# Patient Record
Sex: Male | Born: 1960 | Race: Black or African American | Hispanic: No | Marital: Married | Smoking: Never smoker
Health system: Southern US, Community
[De-identification: ages and names within clinical notes are randomized; demographics above are authoritative.]

## PROBLEM LIST (undated history)

## (undated) DIAGNOSIS — I2510 Atherosclerotic heart disease of native coronary artery without angina pectoris: Secondary | ICD-10-CM

## (undated) DIAGNOSIS — I739 Peripheral vascular disease, unspecified: Secondary | ICD-10-CM

## (undated) DIAGNOSIS — E291 Testicular hypofunction: Secondary | ICD-10-CM

## (undated) DIAGNOSIS — I10 Essential (primary) hypertension: Secondary | ICD-10-CM

## (undated) DIAGNOSIS — E559 Vitamin D deficiency, unspecified: Secondary | ICD-10-CM

## (undated) DIAGNOSIS — G4733 Obstructive sleep apnea (adult) (pediatric): Secondary | ICD-10-CM

## (undated) DIAGNOSIS — E119 Type 2 diabetes mellitus without complications: Secondary | ICD-10-CM

## (undated) DIAGNOSIS — E039 Hypothyroidism, unspecified: Secondary | ICD-10-CM

## (undated) DIAGNOSIS — F419 Anxiety disorder, unspecified: Secondary | ICD-10-CM

## (undated) DIAGNOSIS — E7800 Pure hypercholesterolemia, unspecified: Secondary | ICD-10-CM

## (undated) HISTORY — DX: Type 2 diabetes mellitus without complications: E11.9

## (undated) HISTORY — DX: Anxiety disorder, unspecified: F41.9

## (undated) HISTORY — DX: Essential (primary) hypertension: I10

## (undated) HISTORY — DX: Peripheral vascular disease, unspecified: I73.9

## (undated) HISTORY — DX: Hypothyroidism, unspecified: E03.9

## (undated) HISTORY — PX: COSMETIC SURGERY: SHX468

## (undated) HISTORY — DX: Vitamin D deficiency, unspecified: E55.9

## (undated) HISTORY — DX: Pure hypercholesterolemia, unspecified: E78.00

## (undated) HISTORY — DX: Testicular hypofunction: E29.1

---

## 2004-09-02 ENCOUNTER — Inpatient Hospital Stay (HOSPITAL_COMMUNITY): Admission: EM | Admit: 2004-09-02 | Discharge: 2004-09-05 | Payer: Self-pay | Admitting: Emergency Medicine

## 2017-02-17 ENCOUNTER — Telehealth: Payer: Self-pay

## 2017-02-17 NOTE — Telephone Encounter (Signed)
Sent notes to scheduling 

## 2017-03-17 ENCOUNTER — Institutional Professional Consult (permissible substitution): Payer: BLUE CROSS/BLUE SHIELD | Admitting: Neurology

## 2017-03-17 ENCOUNTER — Ambulatory Visit (INDEPENDENT_AMBULATORY_CARE_PROVIDER_SITE_OTHER): Payer: BLUE CROSS/BLUE SHIELD | Admitting: Neurology

## 2017-03-17 ENCOUNTER — Encounter: Payer: Self-pay | Admitting: Neurology

## 2017-03-17 ENCOUNTER — Encounter (INDEPENDENT_AMBULATORY_CARE_PROVIDER_SITE_OTHER): Payer: Self-pay

## 2017-03-17 VITALS — BP 141/78 | HR 51 | Resp 20 | Ht 75.0 in | Wt 304.0 lb

## 2017-03-17 DIAGNOSIS — R0683 Snoring: Secondary | ICD-10-CM | POA: Diagnosis not present

## 2017-03-17 DIAGNOSIS — G4710 Hypersomnia, unspecified: Secondary | ICD-10-CM | POA: Diagnosis not present

## 2017-03-17 DIAGNOSIS — Z72820 Sleep deprivation: Secondary | ICD-10-CM | POA: Diagnosis not present

## 2017-03-17 DIAGNOSIS — G4719 Other hypersomnia: Secondary | ICD-10-CM | POA: Diagnosis not present

## 2017-03-17 DIAGNOSIS — G4730 Sleep apnea, unspecified: Secondary | ICD-10-CM | POA: Diagnosis not present

## 2017-03-17 DIAGNOSIS — Z6837 Body mass index (BMI) 37.0-37.9, adult: Secondary | ICD-10-CM | POA: Insufficient documentation
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
## 2017-03-25 ENCOUNTER — Telehealth: Payer: Self-pay | Admitting: Cardiovascular Disease

## 2017-03-25 NOTE — Telephone Encounter (Signed)
Received records from [facility] for appointment on 04/13/17 with Dr Duke Salvia.  Records put with Dr Leonides Sake schedule for 04/13/17. lp

## 2017-04-13 ENCOUNTER — Encounter: Payer: Self-pay | Admitting: Cardiovascular Disease

## 2017-04-13 ENCOUNTER — Ambulatory Visit (INDEPENDENT_AMBULATORY_CARE_PROVIDER_SITE_OTHER): Payer: BLUE CROSS/BLUE SHIELD | Admitting: Cardiovascular Disease

## 2017-04-13 VITALS — BP 142/90 | HR 64 | Ht 75.0 in | Wt 304.0 lb

## 2017-04-13 DIAGNOSIS — I10 Essential (primary) hypertension: Secondary | ICD-10-CM | POA: Diagnosis not present

## 2017-04-13 DIAGNOSIS — R0602 Shortness of breath: Secondary | ICD-10-CM

## 2017-04-13 DIAGNOSIS — R079 Chest pain, unspecified: Secondary | ICD-10-CM

## 2017-04-13 DIAGNOSIS — E7800 Pure hypercholesterolemia, unspecified: Secondary | ICD-10-CM | POA: Diagnosis not present

## 2017-04-13 NOTE — Progress Notes (Signed)
Cardiology Office Note   Date:  04/13/2017   ID:  [name] Hammond, DOB August 16, 1961, MRN [id number]  PCP:  Gwynneth Aliment, MD  Cardiologist:   Chilton Si, MD   Chief Complaint  Patient presents with  . New Evaluation    referred by Dr Allyne Gee for HTN and chest pain, pt c/o chest pain yesterday didn't take anything for it      History of Present Illness: [name] Hammond is a 56 y.o. male with hypertension, tobacco abuse, hypothyroidism and diabetes who is being seen today for the evaluation of chest pan at the request of [name] Peng, MD. Mr. Boeve saw Dr. Allyne Gee on 02/12/17 a reported intermittent left-sided chest pain. For the last 2-3 months he notes substernal chest pressure that Typically occurs when he is laying down in bed. It is better when he lays flat and worse when he is at an angle. He thinks it might be treatable to eating late at night. The pain is 7 out of 10 in severity and does not radiate. There is mild shortness of breath and diaphoresis but no nausea. He tried taking antacids and drinking apple cider vinegar without improvement in his symptoms. The symptoms get better when he takes deep breaths. [name] Hammond denies chest pain during the day unless he feel stressed. Sometimes when he is stressed or anxious he has substernal chest pressure but also increases with deep breathing.  His wife notes that he snores heavily and he is scheduled for sleep study later this week. She is also witnessed apneic episodes.  [name] Hammond has been very stressed at work lately. He is working long hours and has a dementing scheduled. He has not been exercising much in the last 2 years and has gained 35 pounds over this time. He has no lower extremity edema recently but does have some when he eats salty meals or is sitting for prolonged periods of time. He denies orthopnea or PND. He eats out daily and does not pay much attention to his salt intake.  [name] Hammond father had a CABG in his 60s.   Past  Medical History:  Diagnosis Date  . Anxiety   . Diabetes mellitus without complication (HCC)   . Hypertension   . Hypothyroid   . Pure hypercholesterolemia   . PVD (peripheral vascular disease) (HCC)   . Testicular hypofunction   . Vitamin D deficiency     History reviewed. No pertinent surgical history.   Current Outpatient Prescriptions  Medication Sig Dispense Refill  . aspirin EC 81 MG tablet Take 81 mg by mouth daily.    Marland Kitchen atorvastatin (LIPITOR) 10 MG tablet Take 10 mg by mouth daily.    Marland Kitchen levothyroxine (SYNTHROID, LEVOTHROID) 100 MCG tablet Take 100 mcg by mouth daily before breakfast.    . lisinopril-hydrochlorothiazide (PRINZIDE,ZESTORETIC) 20-12.5 MG tablet Take 1 tablet by mouth daily.    . pantoprazole (PROTONIX) 40 MG tablet Take 40 mg by mouth daily.    . Saxagliptin-Metformin (KOMBIGLYZE XR) 04-999 MG TB24 Take by mouth.    . sertraline (ZOLOFT) 50 MG tablet Take 50 mg by mouth daily.    Marland Kitchen triamcinolone cream (KENALOG) 0.1 % Apply 1 application topically 2 (two) times daily.     No current facility-administered medications for this visit.     Allergies:   Patient has no known allergies.    Social History:  The patient  reports that he has never smoked. He has never used smokeless tobacco. He reports  that he does not drink alcohol or use drugs.   Family History:  The patient's family history includes Cancer - Other in his father; Diabetes type II in his brother, brother, and mother; Heart disease in his father; Hypertension in his father; Thyroid disease in his mother and sister.    ROS:  Please see the history of present illness.   Otherwise, review of systems are positive for none.   All other systems are reviewed and negative.    PHYSICAL EXAM: VS:  BP (!) 142/90   Pulse 64   Ht  (1.905 m)   Wt (!) 137.9 kg (304 lb)   BMI 38.00 kg/m  , BMI Body mass index is 38 kg/m. GENERAL:  Well appearing HEENT:  Pupils equal round and reactive, fundi not  visualized, oral mucosa unremarkable NECK:  No jugular venous distention, waveform within normal limits, carotid upstroke brisk and symmetric, no bruits, no thyromegaly LYMPHATICS:  No cervical adenopathy LUNGS:  Clear to auscultation bilaterally HEART:  RRR.  PMI not displaced or sustained,S1 and S2 within normal limits, no S3, no S4, no clicks, no rubs, no murmurs ABD:  Flat, positive bowel sounds normal in frequency in pitch, no bruits, no rebound, no guarding, no midline pulsatile mass, no hepatomegaly, no splenomegaly EXT:  2 plus pulses throughout, no edema, no cyanosis no clubbing SKIN:  No rashes no nodules NEURO:  Cranial nerves II through XII grossly intact, motor grossly intact throughout PSYCH:  Cognitively intact, oriented to person place and time    EKG:  EKG is ordered today. The ekg ordered today demonstrates sinus rhythm.  Rate 64 bpm.   Recent Labs: No results found for requested labs within last 8760 hours.    respiratory 2/18:   TSH 2.3.  total cholesterol 139, triglyerides 73, HDL 39, LDL 85  WBC 8.3, hemoglon 16.1, hematcrit 42.9, atelets 278 Hemoglobin A1c 6.5  Sodium 139, potassium 4.1, BUN 10, creatinine 0.86  AST 30, ALT 37  Lipid Panel No results found for: CHOL, TRIG, HDL, CHOLHDL, VLDL, LDLCALC, LDLDIRECT    Wt Readings from Last 3 Encounters:  04/13/17 (!) 137.9 kg (304 lb)  03/17/17 (!) 137.9 kg (304 lb)      ASSESSMENT AND PLAN:  # Atypical chest pain: [name] Hammond symptoms are not classic for angina.  He doesn't have exertional symptoms.  It is worse when laying down, which is suspicious for GERD.  However, it is odd that it improves when laying flat compared with laying at an angle.  OSA could also be the cause.  We will get an 80 to better evaluate for ischemia.  # Hypertension: [name] Hammond hasn't taken his medication yet today. I have asked him to always take his medicine for his appointments. Continue lisinopril and hydrochlorothiazide. I've  asked him to check his blood pressure and bring these reports to his follow-up appointment.  # Hyperlipidemia: LDL 85 01/2017. Continue atorvastatin.   # Morbid obesity:  [name] Hammond was encouraged to start exercising as long as his stress test is okay. He is also eating out most days of the week. We discussed the importance of making healthy her food choices and trying to eat at home more often.      Current medicines are reviewed at length with the patient today.  The patient does not have concerns regarding medicines.  The following changes have been made:  no change  Labs/ tests ordered today include:   Orders Placed This Encounter  Procedures  . EXERCISE TOLERANCE TEST  . EKG 12-Lead     Disposition:   FU with Seve Monette C. Duke Salvia, MD, [facility] in 1 month.   This note was written with the assistance of speech recognition software.  Please excuse any transcriptional errors.  Signed, Zaliyah Meikle C. Duke Salvia, MD, [facility]  04/13/2017 1:18 PM    [facility]

## 2017-04-13 NOTE — Patient Instructions (Addendum)
Medication Instructions:  Your physician recommends that you continue on your current medications as directed. Please refer to the Current Medication list given to you today.  Labwork: none  Testing/Procedures: Your physician has requested that you have an exercise tolerance test. For further information please visit [email or web address]. Please also follow instruction sheet, as given.  Follow-Up: Your physician recommends that you schedule a follow-up appointment in: 1 month ov  If you need a refill on your cardiac medications before your next appointment, please call your pharmacy.  

## 2017-04-17 ENCOUNTER — Ambulatory Visit (INDEPENDENT_AMBULATORY_CARE_PROVIDER_SITE_OTHER): Payer: BLUE CROSS/BLUE SHIELD | Admitting: Neurology

## 2017-04-17 DIAGNOSIS — G4710 Hypersomnia, unspecified: Secondary | ICD-10-CM | POA: Diagnosis not present

## 2017-04-17 DIAGNOSIS — G4730 Sleep apnea, unspecified: Secondary | ICD-10-CM | POA: Diagnosis not present

## 2017-04-17 DIAGNOSIS — Z72820 Sleep deprivation: Secondary | ICD-10-CM

## 2017-04-17 DIAGNOSIS — G4733 Obstructive sleep apnea (adult) (pediatric): Secondary | ICD-10-CM

## 2017-04-17 DIAGNOSIS — G4719 Other hypersomnia: Secondary | ICD-10-CM

## 2017-04-17 DIAGNOSIS — G4734 Idiopathic sleep related nonobstructive alveolar hypoventilation: Secondary | ICD-10-CM

## 2017-04-17 DIAGNOSIS — R0683 Snoring: Secondary | ICD-10-CM

## 2017-04-24 ENCOUNTER — Telehealth (HOSPITAL_COMMUNITY): Payer: Self-pay

## 2017-04-24 NOTE — Telephone Encounter (Signed)
Encounter complete. 

## 2017-04-28 ENCOUNTER — Ambulatory Visit (HOSPITAL_COMMUNITY)
Admission: RE | Admit: 2017-04-28 | Discharge: 2017-04-28 | Disposition: A | Discharge status: Home / Self Care | Payer: BLUE CROSS/BLUE SHIELD | Source: Ambulatory Visit | Attending: Cardiology | Admitting: Cardiology

## 2017-04-28 ENCOUNTER — Encounter (HOSPITAL_COMMUNITY): Payer: Self-pay | Admitting: *Deleted

## 2017-04-28 DIAGNOSIS — R079 Chest pain, unspecified: Secondary | ICD-10-CM | POA: Diagnosis not present

## 2017-04-28 LAB — EXERCISE TOLERANCE TEST
CHL CUP MPHR: 164 {beats}/min
CHL CUP RESTING HR STRESS: 60 {beats}/min
CSEPEDS: 8 s
CSEPEW: 7.2 METS
CSEPPHR: 146 {beats}/min
Exercise duration (min): 6 min
Percent HR: 89 %
RPE: 19

## 2017-04-28 NOTE — Progress Notes (Signed)
Test reviewed by Dr. Duke Salviaandolph, ok to leave.

## 2017-04-29 ENCOUNTER — Telehealth: Payer: Self-pay

## 2017-04-29 NOTE — Procedures (Signed)
PATIENT'S NAME:  [name] Hammond, [name] Hammond DOB:      1961/09/14      MR#:    [id number]     DATE OF RECORDING: 04/17/2017 REFERRING M.D.:  [name], MD Study Performed:   Baseline Polysomnogram HISTORY:  Morbid obesity, Anxiety, Diabetes, Hypertension, Hypothyroidism, PVD, Testicular hypofunction  Patient gained over 40 pounds during a period of depression; begun snoring loudly, wife has witnessed apneas. Sleep hygiene rules were explained and allergic rhinitis treatment recommended aside from formal sleep apnea  testing.  The patient endorsed the Epworth Sleepiness Scale at 8 points.   The patient's weight 304 pounds with a height of 75 (inches), resulting in a BMI of 37.8 kg/m2. The patient's neck circumference measured 19.5 inches.  CURRENT MEDICATIONS: Aspirin, Lipitor, Synthroid, Lisinopril, Metformin, Zoloft, Kenalog   PROCEDURE:  This is a multichannel digital polysomnogram utilizing the Somnostar 11.2 system.  Electrodes and sensors were applied and monitored per AASM Specifications.   EEG, EOG, Chin and Limb EMG, were sampled at 200 Hz.  ECG, Snore and Nasal Pressure, Thermal Airflow, Respiratory Effort, CPAP Flow and Pressure, Oximetry was sampled at 50 Hz. Digital video and audio were recorded.      BASELINE STUDY  Lights Out was at 22:51 and Lights On at 05:15.  Total recording time (TRT) was 384.5 minutes, with a total sleep time (TST) of  335.5 minutes.   The patient's sleep latency was 25.5 minutes.  REM latency was 116 minutes.  The sleep efficiency was 87.3 %.     SLEEP ARCHITECTURE: WASO (Wake after sleep onset) was 31 minutes.  There were 61.5 minutes in Stage N1, 181 minutes Stage N2, 42.5 minutes Stage N3 and 50.5 minutes in Stage REM.  The percentage of Stage N1 was 18.3%, Stage N2 was 53.9%, Stage N3 was 12.7% and Stage R (REM sleep) was 15.1%.   RESPIRATORY ANALYSIS:  There were a total of 267 respiratory events:  116 obstructive apneas, 1 central apnea and 4 mixed apneas with a  total of 121 apneas and an apnea index (AI) of 21.6 /hour. There were 146 hypopneas with a hypopnea index of 26.1 /hour.  The patient also had 0 respiratory event related arousals (RERAs).     The total APNEA/HYPOPNEA INDEX (AHI) was 47.7/hour and the total RESPIRATORY DISTURBANCE INDEX was 47.7 /hour.  33 events occurred in REM sleep and 281 events in NREM. The REM AHI was 39.2 /hour, versus a non-REM AHI of 49.3. The patient spent 335.5 minutes of total sleep time in the supine position and 0 minutes in non-supine. The supine AHI was 47.7 versus a non-supine AHI of 0.0.  OXYGEN SATURATION & C02:  The Wake baseline 02 saturation was 96%, with the lowest being 52%. Time spent below 89% saturation equaled 133 minutes.    PERIODIC LIMB MOVEMENTS:   The patient had a total of 0 Periodic Limb Movements.  The arousals were noted as: 44 were spontaneous, 0 were associated with PLMs, and 127 were associated with respiratory events. Audio and video analysis did not show any abnormal or unusual movements, behaviors, phonations or vocalizations.   The patient took no bathroom breaks. Thunderous Snoring was noted in supine sleep, worst in REM stages. EKG was in keeping with normal sinus rhythm (NSR).    IMPRESSION:  1. Severe Obstructive Sleep Apnea (OSA) with snoring, REM accentuation, all sleep supine. The AHI was  47.7 , hypoxemia  to a SpO2 nadir of  52% with 133 minutes of total desaturation  time.        Hypoxemia is a possible cause of sleep related headaches.  RECOMMENDATIONS:  1. Advise full-night, attended, CPAP titration study to optimize therapy.   2. A follow up appointment will [facility]. The referring provider will be notified of the results.      I certify that I have reviewed the entire raw data recording prior to the issuance of this report in accordance with the Standards of Accreditation of the American Academy of Sleep  Medicine (AASM)      Melvyn Novasarmen Patricia Perales, MD  04-29-2017  Diplomat, American Board of Psychiatry and Neurology  Diplomat, American Board of Sleep Medicine Medical Director, [facility]

## 2017-04-29 NOTE — Telephone Encounter (Signed)
-----   Message from Melvyn Novasarmen Dohmeier, MD sent at 04/29/2017  2:08 PM EDT ----- IMPRESSION:  1. Severe Obstructive Sleep Apnea (OSA) with snoring, REM accentuation, all sleep supine. The AHI was  47.7 /hr. of sleep. 2. Hypoxemia  to a SpO2 nadir of  52% with 133 minutes of total desaturation time.        Hypoxemia is a possible cause of reported sleep related headaches.   RECOMMENDATIONS:  1. Advise full-night, attended, CPAP titration study to optimize therapy.  2. Weight loss is recommended.  3. Exercise and diet to be implemented. Sleep hygiene to be implemented .  2. A follow up appointment will be scheduled in the Sleep Clinic at [facility]. The referring provider will be notified of the results.

## 2017-04-29 NOTE — Addendum Note (Signed)
Addended by: Melvyn NovasHMEIER, Miray Mancino on: 04/29/2017 02:08 PM   Modules accepted: Orders

## 2017-04-29 NOTE — Telephone Encounter (Signed)
I called pt. I advised pt that Dr. Vickey Hugerohmeier reviewed their sleep study results and found that pt has severe osa with hypoxemia. Dr. Vickey Hugerohmeier recommends that pt return for a repeat sleep study in order to properly titrate the cpap and ensure a good mask fit. Pt is agreeable to returning for a titration study. I advised pt that our sleep lab will file with pt's insurance and call pt to schedule the sleep study when we hear back from the pt's insurance regarding coverage of this sleep study. In the meantime, I encouraged exercise and dieting, and discussed sleep hygiene recommendations. Pt verbalized understanding of results. Pt had no questions at this time but was encouraged to call back if questions arise.

## 2017-04-30 ENCOUNTER — Telehealth: Payer: Self-pay | Admitting: Cardiovascular Disease

## 2017-04-30 ENCOUNTER — Encounter: Payer: Self-pay | Admitting: *Deleted

## 2017-04-30 ENCOUNTER — Telehealth: Payer: Self-pay | Admitting: *Deleted

## 2017-04-30 ENCOUNTER — Other Ambulatory Visit: Payer: Self-pay | Admitting: Cardiovascular Disease

## 2017-04-30 DIAGNOSIS — R079 Chest pain, unspecified: Secondary | ICD-10-CM

## 2017-04-30 DIAGNOSIS — R9439 Abnormal result of other cardiovascular function study: Secondary | ICD-10-CM

## 2017-04-30 DIAGNOSIS — Z01818 Encounter for other preprocedural examination: Secondary | ICD-10-CM

## 2017-04-30 NOTE — Telephone Encounter (Signed)
-----   Message from Chilton Siiffany Kerman, MD sent at 04/30/2017  3:45 PM EDT ----- reveiwed with patient.  Cath next week.

## 2017-04-30 NOTE — Telephone Encounter (Signed)
Patient scheduled for cath Friday May 18 arrive at 8:30 for 10:30  Patient aware of date and time Letter at front desk for pick  [facility] [place] [facility]

## 2017-04-30 NOTE — Telephone Encounter (Signed)
Abnormal stress reviewed with [name].  He consents to have a left heart catheterization.  Plan for 5/18.  Risks and benefits of cardiac catheterization have been discussed with the patient.  The patient understands that risks included but are not limited to stroke (1 in 1000), death (1 in 1000), kidney failure [usually temporary] (1 in 500), bleeding (1 in 200), allergic reaction [possibly serious] (1 in 200). The patient understands and agrees to proceed.   Jamile Sivils C. Duke Salviaandolph, MD, [facility]  04/30/2017 3:40 PM

## 2017-05-04 LAB — CBC WITH DIFFERENTIAL/PLATELET
BASOS ABS: 0 {cells}/uL (ref 0–200)
Basophils Relative: 0 %
Eosinophils Absolute: 188 cells/uL (ref 15–500)
Eosinophils Relative: 2 %
HEMATOCRIT: 45.3 % (ref 38.5–50.0)
HEMOGLOBIN: 15.1 g/dL (ref 13.2–17.1)
LYMPHS ABS: 2162 {cells}/uL (ref 850–3900)
LYMPHS PCT: 23 %
MCH: 29.7 pg (ref 27.0–33.0)
MCHC: 33.3 g/dL (ref 32.0–36.0)
MCV: 89.2 fL (ref 80.0–100.0)
MONO ABS: 564 {cells}/uL (ref 200–950)
MPV: 9.7 fL (ref 7.5–12.5)
Monocytes Relative: 6 %
NEUTROS PCT: 69 %
Neutro Abs: 6486 cells/uL (ref 1500–7800)
Platelets: 286 10*3/uL (ref 140–400)
RBC: 5.08 MIL/uL (ref 4.20–5.80)
RDW: 14.2 % (ref 11.0–15.0)
WBC: 9.4 10*3/uL (ref 3.8–10.8)

## 2017-05-05 LAB — BASIC METABOLIC PANEL
BUN: 10 mg/dL (ref 7–25)
CALCIUM: 9.2 mg/dL (ref 8.6–10.3)
CO2: 22 mmol/L (ref 20–31)
CREATININE: 0.91 mg/dL (ref 0.70–1.33)
Chloride: 105 mmol/L (ref 98–110)
GLUCOSE: 208 mg/dL — AB (ref 65–99)
Potassium: 4.2 mmol/L (ref 3.5–5.3)
Sodium: 140 mmol/L (ref 135–146)

## 2017-05-05 LAB — PROTIME-INR
INR: 1
PROTHROMBIN TIME: 10.8 s (ref 9.0–11.5)

## 2017-05-08 ENCOUNTER — Other Ambulatory Visit: Payer: Self-pay | Admitting: *Deleted

## 2017-05-08 ENCOUNTER — Inpatient Hospital Stay (HOSPITAL_COMMUNITY)
Admission: AD | Admit: 2017-05-08 | Discharge: 2017-05-24 | DRG: 234 | Disposition: A | Discharge status: Home Health | Payer: BLUE CROSS/BLUE SHIELD | Source: Ambulatory Visit | Attending: Cardiothoracic Surgery | Admitting: Cardiothoracic Surgery

## 2017-05-08 ENCOUNTER — Encounter (HOSPITAL_COMMUNITY): Payer: Self-pay | Admitting: Cardiovascular Disease

## 2017-05-08 ENCOUNTER — Other Ambulatory Visit (HOSPITAL_COMMUNITY): Payer: BLUE CROSS/BLUE SHIELD

## 2017-05-08 ENCOUNTER — Encounter (HOSPITAL_COMMUNITY)
Admission: AD | Disposition: A | Discharge status: Home Health | Payer: Self-pay | Source: Ambulatory Visit | Attending: Cardiothoracic Surgery

## 2017-05-08 DIAGNOSIS — I8310 Varicose veins of unspecified lower extremity with inflammation: Secondary | ICD-10-CM | POA: Diagnosis present

## 2017-05-08 DIAGNOSIS — Z7952 Long term (current) use of systemic steroids: Secondary | ICD-10-CM

## 2017-05-08 DIAGNOSIS — I25110 Atherosclerotic heart disease of native coronary artery with unstable angina pectoris: Principal | ICD-10-CM | POA: Diagnosis present

## 2017-05-08 DIAGNOSIS — E8770 Fluid overload, unspecified: Secondary | ICD-10-CM | POA: Diagnosis not present

## 2017-05-08 DIAGNOSIS — Y92230 Patient room in hospital as the place of occurrence of the external cause: Secondary | ICD-10-CM | POA: Diagnosis present

## 2017-05-08 DIAGNOSIS — Z8249 Family history of ischemic heart disease and other diseases of the circulatory system: Secondary | ICD-10-CM

## 2017-05-08 DIAGNOSIS — I10 Essential (primary) hypertension: Secondary | ICD-10-CM | POA: Diagnosis present

## 2017-05-08 DIAGNOSIS — F419 Anxiety disorder, unspecified: Secondary | ICD-10-CM | POA: Diagnosis present

## 2017-05-08 DIAGNOSIS — Z79890 Hormone replacement therapy (postmenopausal): Secondary | ICD-10-CM | POA: Diagnosis not present

## 2017-05-08 DIAGNOSIS — E559 Vitamin D deficiency, unspecified: Secondary | ICD-10-CM | POA: Diagnosis present

## 2017-05-08 DIAGNOSIS — E7800 Pure hypercholesterolemia, unspecified: Secondary | ICD-10-CM | POA: Diagnosis not present

## 2017-05-08 DIAGNOSIS — E669 Obesity, unspecified: Secondary | ICD-10-CM | POA: Diagnosis not present

## 2017-05-08 DIAGNOSIS — J9811 Atelectasis: Secondary | ICD-10-CM | POA: Diagnosis not present

## 2017-05-08 DIAGNOSIS — Z951 Presence of aortocoronary bypass graft: Secondary | ICD-10-CM | POA: Diagnosis not present

## 2017-05-08 DIAGNOSIS — D62 Acute posthemorrhagic anemia: Secondary | ICD-10-CM | POA: Diagnosis not present

## 2017-05-08 DIAGNOSIS — R0602 Shortness of breath: Secondary | ICD-10-CM

## 2017-05-08 DIAGNOSIS — I2510 Atherosclerotic heart disease of native coronary artery without angina pectoris: Secondary | ICD-10-CM

## 2017-05-08 DIAGNOSIS — G4733 Obstructive sleep apnea (adult) (pediatric): Secondary | ICD-10-CM | POA: Diagnosis not present

## 2017-05-08 DIAGNOSIS — E1151 Type 2 diabetes mellitus with diabetic peripheral angiopathy without gangrene: Secondary | ICD-10-CM | POA: Diagnosis present

## 2017-05-08 DIAGNOSIS — E785 Hyperlipidemia, unspecified: Secondary | ICD-10-CM | POA: Diagnosis present

## 2017-05-08 DIAGNOSIS — Y832 Surgical operation with anastomosis, bypass or graft as the cause of abnormal reaction of the patient, or of later complication, without mention of misadventure at the time of the procedure: Secondary | ICD-10-CM | POA: Diagnosis present

## 2017-05-08 DIAGNOSIS — Z7982 Long term (current) use of aspirin: Secondary | ICD-10-CM

## 2017-05-08 DIAGNOSIS — E039 Hypothyroidism, unspecified: Secondary | ICD-10-CM | POA: Diagnosis present

## 2017-05-08 DIAGNOSIS — Z7984 Long term (current) use of oral hypoglycemic drugs: Secondary | ICD-10-CM | POA: Diagnosis not present

## 2017-05-08 DIAGNOSIS — Z01810 Encounter for preprocedural cardiovascular examination: Secondary | ICD-10-CM | POA: Diagnosis not present

## 2017-05-08 DIAGNOSIS — E782 Mixed hyperlipidemia: Secondary | ICD-10-CM | POA: Diagnosis not present

## 2017-05-08 DIAGNOSIS — Z79899 Other long term (current) drug therapy: Secondary | ICD-10-CM

## 2017-05-08 DIAGNOSIS — Z6836 Body mass index (BMI) 36.0-36.9, adult: Secondary | ICD-10-CM | POA: Diagnosis not present

## 2017-05-08 DIAGNOSIS — Z8349 Family history of other endocrine, nutritional and metabolic diseases: Secondary | ICD-10-CM

## 2017-05-08 DIAGNOSIS — I259 Chronic ischemic heart disease, unspecified: Secondary | ICD-10-CM | POA: Diagnosis present

## 2017-05-08 DIAGNOSIS — R001 Bradycardia, unspecified: Secondary | ICD-10-CM | POA: Diagnosis present

## 2017-05-08 DIAGNOSIS — E1169 Type 2 diabetes mellitus with other specified complication: Secondary | ICD-10-CM | POA: Diagnosis not present

## 2017-05-08 DIAGNOSIS — F329 Major depressive disorder, single episode, unspecified: Secondary | ICD-10-CM | POA: Diagnosis present

## 2017-05-08 DIAGNOSIS — Z833 Family history of diabetes mellitus: Secondary | ICD-10-CM

## 2017-05-08 DIAGNOSIS — R000 Tachycardia, unspecified: Secondary | ICD-10-CM | POA: Diagnosis not present

## 2017-05-08 DIAGNOSIS — L7632 Postprocedural hematoma of skin and subcutaneous tissue following other procedure: Secondary | ICD-10-CM | POA: Diagnosis not present

## 2017-05-08 DIAGNOSIS — I200 Unstable angina: Secondary | ICD-10-CM

## 2017-05-08 DIAGNOSIS — E291 Testicular hypofunction: Secondary | ICD-10-CM | POA: Diagnosis present

## 2017-05-08 DIAGNOSIS — Z22322 Carrier or suspected carrier of Methicillin resistant Staphylococcus aureus: Secondary | ICD-10-CM

## 2017-05-08 DIAGNOSIS — R0789 Other chest pain: Secondary | ICD-10-CM | POA: Diagnosis present

## 2017-05-08 HISTORY — DX: Obstructive sleep apnea (adult) (pediatric): G47.33

## 2017-05-08 HISTORY — PX: LEFT HEART CATH AND CORONARY ANGIOGRAPHY: CATH118249

## 2017-05-08 HISTORY — PX: INTRAVASCULAR ULTRASOUND/IVUS: CATH118244

## 2017-05-08 LAB — GLUCOSE, CAPILLARY
GLUCOSE-CAPILLARY: 120 mg/dL — AB (ref 65–99)
Glucose-Capillary: 140 mg/dL — ABNORMAL HIGH (ref 65–99)
Glucose-Capillary: 128 mg/dL — ABNORMAL HIGH (ref 65–99)
Glucose-Capillary: 117 mg/dL — ABNORMAL HIGH (ref 65–99)

## 2017-05-08 LAB — URINALYSIS, ROUTINE W REFLEX MICROSCOPIC
Bilirubin Urine: NEGATIVE
Glucose, UA: NEGATIVE mg/dL
Hgb urine dipstick: NEGATIVE
Ketones, ur: NEGATIVE mg/dL
Leukocytes, UA: NEGATIVE
Nitrite: NEGATIVE
Protein, ur: NEGATIVE mg/dL
Specific Gravity, Urine: 1.023 (ref 1.005–1.030)
pH: 6 (ref 5.0–8.0)

## 2017-05-08 LAB — SURGICAL PCR SCREEN
MRSA, PCR: POSITIVE — AB
Staphylococcus aureus: POSITIVE — AB

## 2017-05-08 LAB — POCT ACTIVATED CLOTTING TIME: Activated Clotting Time: 224 seconds

## 2017-05-08 SURGERY — LEFT HEART CATH AND CORONARY ANGIOGRAPHY
Anesthesia: LOCAL

## 2017-05-08 MED ORDER — MIDAZOLAM HCL 2 MG/2ML IJ SOLN
INTRAMUSCULAR | Status: AC
  Filled 2017-05-08: qty 2

## 2017-05-08 MED ORDER — MIDAZOLAM HCL 2 MG/2ML IJ SOLN
INTRAMUSCULAR | Status: DC | PRN
  Administered 2017-05-08: 2 mg via INTRAVENOUS

## 2017-05-08 MED ORDER — ASPIRIN EC 81 MG PO TBEC
81.0000 mg | DELAYED_RELEASE_TABLET | Freq: Every evening | ORAL | Status: DC
  Administered 2017-05-08 – 2017-05-11 (×4): 81 mg via ORAL
  Filled 2017-05-08 (×4): qty 1

## 2017-05-08 MED ORDER — ASPIRIN 81 MG PO CHEW
81.0000 mg | CHEWABLE_TABLET | ORAL | Status: AC
  Administered 2017-05-08: 81 mg via ORAL

## 2017-05-08 MED ORDER — LIDOCAINE HCL (PF) 1 % IJ SOLN
INTRAMUSCULAR | Status: AC
  Filled 2017-05-08: qty 30

## 2017-05-08 MED ORDER — LISINOPRIL-HYDROCHLOROTHIAZIDE 20-12.5 MG PO TABS: 1.0000 | ORAL_TABLET | Freq: Every day | ORAL | Status: DC

## 2017-05-08 MED ORDER — VERAPAMIL HCL 2.5 MG/ML IV SOLN
INTRAVENOUS | Status: AC
  Filled 2017-05-08: qty 2

## 2017-05-08 MED ORDER — HYDROCHLOROTHIAZIDE 12.5 MG PO CAPS
12.5000 mg | ORAL_CAPSULE | Freq: Every day | ORAL | Status: DC
  Administered 2017-05-08 – 2017-05-11 (×4): 12.5 mg via ORAL
  Filled 2017-05-08 (×4): qty 1

## 2017-05-08 MED ORDER — SODIUM CHLORIDE 0.9% FLUSH: 3.0000 mL | INTRAVENOUS | Status: DC | PRN

## 2017-05-08 MED ORDER — IOPAMIDOL (ISOVUE-370) INJECTION 76%
INTRAVENOUS | Status: AC
  Filled 2017-05-08: qty 100

## 2017-05-08 MED ORDER — INSULIN ASPART 100 UNIT/ML ~~LOC~~ SOLN
0.0000 [IU] | Freq: Three times a day (TID) | SUBCUTANEOUS | Status: DC
  Administered 2017-05-09: 3 [IU] via SUBCUTANEOUS
  Administered 2017-05-10: 2 [IU] via SUBCUTANEOUS
  Administered 2017-05-11: 3 [IU] via SUBCUTANEOUS

## 2017-05-08 MED ORDER — SERTRALINE HCL 50 MG PO TABS
50.0000 mg | ORAL_TABLET | Freq: Every evening | ORAL | Status: DC
  Administered 2017-05-08 – 2017-05-23 (×15): 50 mg via ORAL
  Filled 2017-05-08 (×15): qty 1

## 2017-05-08 MED ORDER — HEPARIN (PORCINE) IN NACL 2-0.9 UNIT/ML-% IJ SOLN
INTRAMUSCULAR | Status: AC
  Filled 2017-05-08: qty 1000

## 2017-05-08 MED ORDER — LIDOCAINE HCL (PF) 1 % IJ SOLN
INTRAMUSCULAR | Status: DC | PRN
  Administered 2017-05-08: 2 mL via SUBCUTANEOUS

## 2017-05-08 MED ORDER — SODIUM CHLORIDE 0.9 % IV SOLN
INTRAVENOUS | Status: AC
  Administered 2017-05-08: 15:00:00 via INTRAVENOUS

## 2017-05-08 MED ORDER — ACETAMINOPHEN 325 MG PO TABS: 650.0000 mg | ORAL_TABLET | ORAL | Status: DC | PRN

## 2017-05-08 MED ORDER — MUPIROCIN 2 % EX OINT
1.0000 "application " | TOPICAL_OINTMENT | Freq: Two times a day (BID) | CUTANEOUS | Status: AC
  Administered 2017-05-08 – 2017-05-13 (×9): 1 via NASAL
  Filled 2017-05-08 (×2): qty 22

## 2017-05-08 MED ORDER — HEPARIN (PORCINE) IN NACL 2-0.9 UNIT/ML-% IJ SOLN
INTRAMUSCULAR | Status: AC | PRN
  Administered 2017-05-08: 1000 mL

## 2017-05-08 MED ORDER — ZOLPIDEM TARTRATE 5 MG PO TABS: 5.0000 mg | ORAL_TABLET | Freq: Every evening | ORAL | Status: DC | PRN

## 2017-05-08 MED ORDER — LISINOPRIL 20 MG PO TABS
20.0000 mg | ORAL_TABLET | Freq: Every day | ORAL | Status: DC
  Administered 2017-05-08 – 2017-05-11 (×4): 20 mg via ORAL
  Filled 2017-05-08 (×4): qty 1

## 2017-05-08 MED ORDER — HEPARIN SODIUM (PORCINE) 1000 UNIT/ML IJ SOLN
INTRAMUSCULAR | Status: AC
  Filled 2017-05-08: qty 1

## 2017-05-08 MED ORDER — SODIUM CHLORIDE 0.9 % IV SOLN: 250.0000 mL | INTRAVENOUS | Status: DC | PRN

## 2017-05-08 MED ORDER — ASPIRIN 81 MG PO CHEW
CHEWABLE_TABLET | ORAL | Status: AC
  Administered 2017-05-08: 81 mg via ORAL
  Filled 2017-05-08: qty 1

## 2017-05-08 MED ORDER — SODIUM CHLORIDE 0.9% FLUSH: 3.0000 mL | Freq: Two times a day (BID) | INTRAVENOUS | Status: DC

## 2017-05-08 MED ORDER — FENTANYL CITRATE (PF) 100 MCG/2ML IJ SOLN
INTRAMUSCULAR | Status: DC | PRN
  Administered 2017-05-08: 50 ug via INTRAVENOUS

## 2017-05-08 MED ORDER — IOPAMIDOL (ISOVUE-370) INJECTION 76%
INTRAVENOUS | Status: DC | PRN
  Administered 2017-05-08: 100 mL

## 2017-05-08 MED ORDER — ONDANSETRON HCL 4 MG/2ML IJ SOLN: 4.0000 mg | Freq: Four times a day (QID) | INTRAMUSCULAR | Status: DC | PRN

## 2017-05-08 MED ORDER — ATORVASTATIN CALCIUM 80 MG PO TABS
80.0000 mg | ORAL_TABLET | Freq: Every day | ORAL | Status: DC
  Administered 2017-05-08 – 2017-05-23 (×15): 80 mg via ORAL
  Filled 2017-05-08 (×15): qty 1

## 2017-05-08 MED ORDER — SODIUM CHLORIDE 0.9% FLUSH
3.0000 mL | Freq: Two times a day (BID) | INTRAVENOUS | Status: DC
Start: 2017-05-08 — End: 2017-05-12
  Administered 2017-05-08 – 2017-05-11 (×7): 3 mL via INTRAVENOUS

## 2017-05-08 MED ORDER — SODIUM CHLORIDE 0.9 % WEIGHT BASED INFUSION: 1.0000 mL/kg/h | INTRAVENOUS | Status: DC

## 2017-05-08 MED ORDER — INSULIN GLARGINE 100 UNIT/ML ~~LOC~~ SOLN
20.0000 [IU] | Freq: Every day | SUBCUTANEOUS | Status: DC
  Administered 2017-05-08 – 2017-05-11 (×4): 20 [IU] via SUBCUTANEOUS
  Filled 2017-05-08 (×4): qty 0.2

## 2017-05-08 MED ORDER — SODIUM CHLORIDE 0.9 % WEIGHT BASED INFUSION
3.0000 mL/kg/h | INTRAVENOUS | Status: DC
  Administered 2017-05-08: 3 mL/kg/h via INTRAVENOUS

## 2017-05-08 MED ORDER — LEVOTHYROXINE SODIUM 100 MCG PO TABS
100.0000 ug | ORAL_TABLET | Freq: Every day | ORAL | Status: DC
  Administered 2017-05-09 – 2017-05-24 (×15): 100 ug via ORAL
  Filled 2017-05-08 (×15): qty 1

## 2017-05-08 MED ORDER — ALPRAZOLAM 0.25 MG PO TABS: 0.2500 mg | ORAL_TABLET | Freq: Two times a day (BID) | ORAL | Status: DC | PRN

## 2017-05-08 MED ORDER — MOMETASONE FURO-FORMOTEROL FUM 200-5 MCG/ACT IN AERO
2.0000 | INHALATION_SPRAY | Freq: Two times a day (BID) | RESPIRATORY_TRACT | Status: DC
  Filled 2017-05-08 (×2): qty 8.8

## 2017-05-08 MED ORDER — IOPAMIDOL (ISOVUE-370) INJECTION 76%
INTRAVENOUS | Status: AC
  Filled 2017-05-08: qty 50

## 2017-05-08 MED ORDER — CHLORHEXIDINE GLUCONATE CLOTH 2 % EX PADS
6.0000 | MEDICATED_PAD | Freq: Every day | CUTANEOUS | Status: DC
  Administered 2017-05-09 – 2017-05-11 (×4): 6 via TOPICAL

## 2017-05-08 MED ORDER — GUAIFENESIN ER 600 MG PO TB12
600.0000 mg | ORAL_TABLET | Freq: Two times a day (BID) | ORAL | Status: DC
  Administered 2017-05-08 – 2017-05-11 (×7): 600 mg via ORAL
  Filled 2017-05-08 (×7): qty 1

## 2017-05-08 MED ORDER — HEPARIN SODIUM (PORCINE) 1000 UNIT/ML IJ SOLN
INTRAMUSCULAR | Status: DC | PRN
  Administered 2017-05-08: 3000 [IU] via INTRAVENOUS
  Administered 2017-05-08 (×2): 6000 [IU] via INTRAVENOUS

## 2017-05-08 MED ORDER — FENTANYL CITRATE (PF) 100 MCG/2ML IJ SOLN
INTRAMUSCULAR | Status: AC
  Filled 2017-05-08: qty 2

## 2017-05-08 SURGICAL SUPPLY — 15 items
CATH 5FR JL3.5 JR4 ANG PIG MP (CATHETERS) ×1 IMPLANT
CATH OPTICROSS 40MHZ (CATHETERS) ×2 IMPLANT
CATH VISTA GUIDE 6FR XBLAD3.5 (CATHETERS) ×1 IMPLANT
DEVICE RAD COMP TR BAND LRG (VASCULAR PRODUCTS) ×1 IMPLANT
GLIDESHEATH SLEND SS 6F .021 (SHEATH) ×1 IMPLANT
GUIDEWIRE INQWIRE 1.5J.035X260 (WIRE) IMPLANT
INQWIRE 1.5J .035X260CM (WIRE) ×2
KIT ESSENTIALS PG (KITS) ×1 IMPLANT
KIT HEART LEFT (KITS) ×2 IMPLANT
PACK CARDIAC CATHETERIZATION (CUSTOM PROCEDURE TRAY) ×2 IMPLANT
SLED PULL BACK IVUS (MISCELLANEOUS) ×1 IMPLANT
SYR MEDRAD MARK V 150ML (SYRINGE) ×2 IMPLANT
TRANSDUCER W/STOPCOCK (MISCELLANEOUS) ×2 IMPLANT
TUBING CIL FLEX 10 FLL-RA (TUBING) ×2 IMPLANT
WIRE COUGAR XT STRL 190CM (WIRE) ×1 IMPLANT

## 2017-05-08 NOTE — H&P (View-Only) (Signed)
  Cardiology Office Note   Date:  04/13/2017   ID:  [name] Hammond, DOB 12/24/1960, MRN [id number]  PCP:  Robyn N Sanders, MD  Cardiologist:   Valdis Bevill [place], MD   Chief Complaint  Patient presents with  . New Evaluation    referred by Dr Sanders for HTN and chest pain, pt c/o chest pain yesterday didn't take anything for it      History of Present Illness: [name] Hammond is a 56 y.o. male with hypertension, tobacco abuse, hypothyroidism and diabetes who is being seen today for the evaluation of chest pan at the request of Sanders, Robyn, MD. [name] Hammond saw Dr. Sanders on 02/12/17 a reported intermittent left-sided chest pain. For the last 2-3 months he notes substernal chest pressure that Typically occurs when he is laying down in bed. It is better when he lays flat and worse when he is at an angle. He thinks it might be treatable to eating late at night. The pain is 7 out of 10 in severity and does not radiate. There is mild shortness of breath and diaphoresis but no nausea. He tried taking antacids and drinking apple cider vinegar without improvement in his symptoms. The symptoms get better when he takes deep breaths. [name] Hammond denies chest pain during the day unless he feel stressed. Sometimes when he is stressed or anxious he has substernal chest pressure but also increases with deep breathing.  His wife notes that he snores heavily and he is scheduled for sleep study later this week. She is also witnessed apneic episodes.  [name] Hammond has been very stressed at work lately. He is working long hours and has a dementing scheduled. He has not been exercising much in the last 2 years and has gained 35 pounds over this time. He has no lower extremity edema recently but does have some when he eats salty meals or is sitting for prolonged periods of time. He denies orthopnea or PND. He eats out daily and does not pay much attention to his salt intake.  [name] Hammond's father had a CABG in his 50s.   Past  Medical History:  Diagnosis Date  . Anxiety   . Diabetes mellitus without complication (HCC)   . Hypertension   . Hypothyroid   . Pure hypercholesterolemia   . PVD (peripheral vascular disease) (HCC)   . Testicular hypofunction   . Vitamin D deficiency     History reviewed. No pertinent surgical history.   Current Outpatient Prescriptions  Medication Sig Dispense Refill  . aspirin EC 81 MG tablet Take 81 mg by mouth daily.    . atorvastatin (LIPITOR) 10 MG tablet Take 10 mg by mouth daily.    . levothyroxine (SYNTHROID, LEVOTHROID) 100 MCG tablet Take 100 mcg by mouth daily before breakfast.    . lisinopril-hydrochlorothiazide (PRINZIDE,ZESTORETIC) 20-12.5 MG tablet Take 1 tablet by mouth daily.    . pantoprazole (PROTONIX) 40 MG tablet Take 40 mg by mouth daily.    . Saxagliptin-Metformin (KOMBIGLYZE XR) 04-999 MG TB24 Take by mouth.    . sertraline (ZOLOFT) 50 MG tablet Take 50 mg by mouth daily.    . triamcinolone cream (KENALOG) 0.1 % Apply 1 application topically 2 (two) times daily.     No current facility-administered medications for this visit.     Allergies:   Patient has no known allergies.    Social History:  The patient  reports that he has never smoked. He has never used smokeless tobacco. He reports   that he does not drink alcohol or use drugs.   Family History:  The patient's family history includes Cancer - Other in his father; Diabetes type II in his brother, brother, and mother; Heart disease in his father; Hypertension in his father; Thyroid disease in his mother and sister.    ROS:  Please see the history of present illness.   Otherwise, review of systems are positive for none.   All other systems are reviewed and negative.    PHYSICAL EXAM: VS:  BP (!) 142/90   Pulse 64   Ht 6' 3" (1.905 m)   Wt (!) 137.9 kg (304 lb)   BMI 38.00 kg/m  , BMI Body mass index is 38 kg/m. GENERAL:  Well appearing HEENT:  Pupils equal round and reactive, fundi not  visualized, oral mucosa unremarkable NECK:  No jugular venous distention, waveform within normal limits, carotid upstroke brisk and symmetric, no bruits, no thyromegaly LYMPHATICS:  No cervical adenopathy LUNGS:  Clear to auscultation bilaterally HEART:  RRR.  PMI not displaced or sustained,S1 and S2 within normal limits, no S3, no S4, no clicks, no rubs, no murmurs ABD:  Flat, positive bowel sounds normal in frequency in pitch, no bruits, no rebound, no guarding, no midline pulsatile mass, no hepatomegaly, no splenomegaly EXT:  2 plus pulses throughout, no edema, no cyanosis no clubbing SKIN:  No rashes no nodules NEURO:  Cranial nerves II through XII grossly intact, motor grossly intact throughout PSYCH:  Cognitively intact, oriented to person place and time    EKG:  EKG is ordered today. The ekg ordered today demonstrates sinus rhythm.  Rate 64 bpm.   Recent Labs: No results found for requested labs within last 8760 hours.    respiratory 2/18:   TSH 2.3.  total cholesterol 139, triglyerides 73, HDL 39, LDL 85  WBC 8.3, hemoglon 14.8, hematcrit 42.9, atelets 278 Hemoglobin A1c 6.5  Sodium 139, potassium 4.1, BUN 10, creatinine 0.86  AST 30, ALT 37  Lipid Panel No results found for: CHOL, TRIG, HDL, CHOLHDL, VLDL, LDLCALC, LDLDIRECT    Wt Readings from Last 3 Encounters:  04/13/17 (!) 137.9 kg (304 lb)  03/17/17 (!) 137.9 kg (304 lb)      ASSESSMENT AND PLAN:  # Atypical chest pain: [name] Hammond's symptoms are not classic for angina.  He doesn't have exertional symptoms.  It is worse when laying down, which is suspicious for GERD.  However, it is odd that it improves when laying flat compared with laying at an angle.  OSA could also be the cause.  We will get an 80 to better evaluate for ischemia.  # Hypertension: [name] Hammond hasn't taken his medication yet today. I have asked him to always take his medicine for his appointments. Continue lisinopril and hydrochlorothiazide. I've  asked him to check his blood pressure and bring these reports to his follow-up appointment.  # Hyperlipidemia: LDL 85 01/2017. Continue atorvastatin.   # Morbid obesity:  [name] Hammond was encouraged to start exercising as long as his stress test is okay. He is also eating out most days of the week. We discussed the importance of making healthy her food choices and trying to eat at home more often.      Current medicines are reviewed at length with the patient today.  The patient does not have concerns regarding medicines.  The following changes have been made:  no change  Labs/ tests ordered today include:   Orders Placed This Encounter    Procedures  . EXERCISE TOLERANCE TEST  . EKG 12-Lead     Disposition:   FU with [name] Hammond C. [place], MD, FACC in 1 month.   This note was written with the assistance of speech recognition software.  Please excuse any transcriptional errors.  Signed, Sherrin Stahle C. Willcox, MD, FACC  04/13/2017 1:18 PM    [facility] 

## 2017-05-08 NOTE — Consult Note (Signed)
[address]       [place] [address]             [phone]        Tymar Polyak [facility] Medical Record #[id number] Date of Birth: 1961-01-09  Referring: Clifton James  [facility]) Primary Care: [name], MD  Chief Complaint:   CAD  History of Present Illness:      Mr. Dull is a 56 yo morbidly obese AA male with known history of HTN, OSA, hypothyroidism, and diabetes.  The patient presented to to his PCP in February with complaints of intermittent left sided chest pain.  He admitted for the past 2-3 months that he noted substernal chest pressure.  This typically occurs when the patient was laying down, worse if at an angle, better if laying flat.  The patient felt this was relatable to eating late at night.  The pain was 7/10 without radiation.  He attempted relief with antacids and apple cider vinegar.  Due to this he was referred to [facility] heart care for evaluation.  He was seen by Dr. Duke Salvia who felt the patients complaints were not classic angina.  However, it was indicated that stress test should be performed.  This was done on 04/28/2017 and was positive for ischemia.  He was subsequently scheduled for cardiac catheterization.  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  It was felt due to the location of his CAD, coronary bypass grafting would be indicated and TCTS consult has been requested.  Currently the patient is chest pain free.  He does have a positive family history of CAD with his father having bypass surgery in his early 78s.  He is stressed as of late by his work.  He is a Surveyor, minerals for [employer].  He has gained weight due to depression.  Current Activity/ Functional Status: Patient is independent with mobility/ambulation, transfers, ADL's, IADL's.   Zubrod Score: At the time of surgery this patient's most appropriate activity status/level should be described as: []     0    Normal activity, no  symptoms [x]     1    Restricted in physical strenuous activity but ambulatory, able to do out light work []     2    Ambulatory and capable of self care, unable to do work activities, up and about                 more than 50%  Of the time                            []     3    Only limited self care, in bed greater than 50% of waking hours []     4    Completely disabled, no self care, confined to bed or chair []     5    Moribund  Past Medical History:  Diagnosis Date  . Anxiety   . Diabetes mellitus without complication (HCC)   . Hypertension   . Hypothyroid   . Pure hypercholesterolemia   . PVD (peripheral vascular disease) (HCC)   . Testicular hypofunction   . Vitamin D deficiency     Past Surgical History:  Procedure Laterality Date  . INTRAVASCULAR ULTRASOUND/IVUS N/A 05/08/2017   Procedure: Intravascular Ultrasound/IVUS;  Surgeon: Kathleene Hazel, MD;  Location: [facility] INVASIVE CV LAB;  Service: Cardiovascular;  Laterality: N/A;  .  LEFT HEART CATH AND CORONARY ANGIOGRAPHY N/A 05/08/2017   Procedure: Left Heart Cath and Coronary Angiography;  Surgeon: Kathleene Hazel, MD;  Location: [facility] INVASIVE CV LAB;  Service: Cardiovascular;  Laterality: N/A;    History  Smoking Status  . Never Smoker  Smokeless Tobacco  . Never Used    History  Alcohol Use No    Social History   Social History  . Marital status: Married    Spouse name: N/A  . Number of children: N/A  . Years of education: N/A   Occupational History  . Not on file.   Social History Main Topics  . Smoking status: Never Smoker  . Smokeless tobacco: Never Used  . Alcohol use No  . Drug use: No  . Sexual activity: Not on file   Other Topics Concern  . Not on file   Social History Narrative  . No narrative on file    No Known Allergies  Current Facility-Administered Medications  Medication Dose Route Frequency Provider Last Rate Last Dose  . 0.9 %  sodium chloride infusion   Intravenous  Continuous Kathleene Hazel, MD 75 mL/hr at 05/08/17 1448    . 0.9 %  sodium chloride infusion  250 mL Intravenous PRN Kathleene Hazel, MD      . acetaminophen (TYLENOL) tablet 650 mg  650 mg Oral Q4H PRN Kathleene Hazel, MD      . ALPRAZolam Prudy Feeler) tablet 0.25 mg  0.25 mg Oral BID PRN Kathleene Hazel, MD      . aspirin EC tablet 81 mg  81 mg Oral QPM Kathleene Hazel, MD      . atorvastatin (LIPITOR) tablet 80 mg  80 mg Oral QHS Kathleene Hazel, MD      . lisinopril (PRINIVIL,ZESTRIL) tablet 20 mg  20 mg Oral Daily Kathleene Hazel, MD   20 mg at 05/08/17 1254   And  . hydrochlorothiazide (MICROZIDE) capsule 12.5 mg  12.5 mg Oral Daily Kathleene Hazel, MD   12.5 mg at 05/08/17 1254  . insulin aspart (novoLOG) injection 0-15 Units  0-15 Units Subcutaneous TID WC Kathleene Hazel, MD      . Melene Muller ON 05/09/2017] levothyroxine (SYNTHROID, LEVOTHROID) tablet 100 mcg  100 mcg Oral QAC breakfast Kathleene Hazel, MD      . ondansetron [facility]) injection 4 mg  4 mg Intravenous Q6H PRN Kathleene Hazel, MD      . sertraline (ZOLOFT) tablet 50 mg  50 mg Oral QPM Verne Carrow D, MD      . sodium chloride flush (NS) 0.9 % injection 3 mL  3 mL Intravenous Q12H Verne Carrow D, MD      . sodium chloride flush (NS) 0.9 % injection 3 mL  3 mL Intravenous PRN Kathleene Hazel, MD      . zolpidem (AMBIEN) tablet 5 mg  5 mg Oral QHS PRN,MR X 1 Kathleene Hazel, MD        Prescriptions Prior to Admission  Medication Sig Dispense Refill Last Dose  . aspirin EC 81 MG tablet Take 81 mg by mouth every evening.    05/07/2017 at Unknown time  . atorvastatin (LIPITOR) 10 MG tablet Take 10 mg by mouth at bedtime.    05/07/2017 at Unknown time  . calcium carbonate (TUMS - DOSED IN MG ELEMENTAL CALCIUM) 500 MG chewable tablet Chew 1-2 tablets by mouth at bedtime as needed for indigestion or heartburn.   Past  Week  at Unknown time  . levothyroxine (SYNTHROID, LEVOTHROID) 100 MCG tablet Take 100 mcg by mouth daily before breakfast.   05/07/2017 at Unknown time  . lisinopril-hydrochlorothiazide (PRINZIDE,ZESTORETIC) 20-12.5 MG tablet Take 1 tablet by mouth daily at 12 noon.    05/07/2017 at Unknown time  . neomycin-bacitracin-polymyxin (NEOSPORIN) ointment Apply 1 application topically as needed for wound care. apply to eye   Past Week at Unknown time  . Saxagliptin-Metformin (KOMBIGLYZE XR) 04-999 MG TB24 Take 1 tablet by mouth daily with breakfast.    05/07/2017 at 0700  . sertraline (ZOLOFT) 50 MG tablet Take 50 mg by mouth every evening.    05/07/2017 at Unknown time    Family History  Problem Relation Age of Onset  . Diabetes type II Mother   . Thyroid disease Mother   . Cancer - Other Father        Liver  . Hypertension Father   . Heart disease Father        Bypass surgery  . Thyroid disease Sister   . Diabetes type II Brother   . Diabetes type II Brother      Review of Systems:  Constitutional: positive for weight gain, negative for fevers and night sweats Eyes: negative Respiratory: positive for mild shortness of breath with episodes of chest discomfort Cardiovascular: positive for chest pressure/discomfort and lower extremity edema Gastrointestinal: negative Hematologic/lymphatic: venous stasis changes noted, mild varicosities RLE Neurological: negative Endocrine: positive for Hypothyroidism, DM     Cardiac Review of Systems: Y or N  Chest Pain [ y   ]  Resting SOB [   ] Exertional SOB  [  ]  Orthopnea [  ]   Pedal Edema [   ]    Palpitations [  ] Syncope  [  ]   Presyncope [   ]  General Review of Systems: [Y] = yes [  ]=no Constitional: recent weight change [ y ]; anorexia [  ]; fatigue [  ]; nausea [  ]; night sweats [  ]; fever [  ]; or chills [  ]                                                               Dental: poor dentition[  ]; Last Dentist visit:   Eye : blurred vision [   ]; diplopia [   ]; vision changes [  ];  Amaurosis fugax[  ]; Resp: cough [ n ];  wheezing[ n ];  hemoptysis[  ]; shortness of breath[  ]; paroxysmal nocturnal dyspnea[  ]; dyspnea on exertion[ n ]; or orthopnea[  ];  GI:  gallstones[  ], vomiting[  ];  dysphagia[  ]; melena[  ];  hematochezia [  ]; heartburn[  ];   Hx of  Colonoscopy[  ]; GU: kidney stones [  ]; hematuria[  ];   dysuria [  ];  nocturia[  ];  history of     obstruction [  ]; urinary frequency [  ]             Skin: rash, swelling[  ];, hair loss[  ];  peripheral edema[ y ];  or itching[  ]; Musculosketetal: myalgias[  ];  joint swelling[  ];  joint erythema[  ];  joint pain[  ];  back pain[  ];  Heme/Lymph: bruising[  ];  bleeding[  ];  anemia[  ];  Neuro: TIA[n  ];  headaches[n  ];  stroke[  ];  vertigo[  ];  seizures[  ];   paresthesias[  ];  difficulty walking[  ];  Psych:depression[  ]; anxiety[  ];  Endocrine: diabetes[y  ];  thyroid dysfunction[ y ];  Immunizations: Flu [  ]; Pneumococcal[  ];  Other:  Physical Exam: BP (!) 147/88   Pulse (!) 0   Temp 97.6 F (36.4 C) (Oral)   Resp (!) 0   Ht 6\' 3"  (1.905 m)   Wt 300 lb (136.1 kg)   SpO2 95%   BMI 37.50 kg/m   General appearance: alert, cooperative, no distress and morbidly obese Head: Normocephalic, without obvious abnormality, atraumatic Neck: no adenopathy, no carotid bruit, no JVD, supple, symmetrical, trachea midline and thyroid not enlarged, symmetric, no tenderness/mass/nodules Resp: clear to auscultation bilaterally Cardio: regular rate and rhythm GI: soft, non-tender; bowel sounds normal; no masses,  no organomegaly Extremities: varicose veins noted and venous stasis dermatitis noted Neurologic: Grossly normal  Diagnostic Studies & Laboratory data:     Recent Radiology Findings:   No results found.   I have independently reviewed the above radiologic studies.  Recent Lab Findings: Lab Results  Component Value Date   WBC 9.4 05/04/2017    HGB 15.1 05/04/2017   HCT 45.3 05/04/2017   PLT 286 05/04/2017   GLUCOSE 208 (H) 05/04/2017   NA 140 05/04/2017   K 4.2 05/04/2017   CL 105 05/04/2017   CREATININE 0.91 05/04/2017   BUN 10 05/04/2017   CO2 22 05/04/2017   INR 1.0 05/04/2017      Assessment / Plan:      1. CAD- by cath today.. Needs CABG, chest pain free.. Tentative for surgery Tuesday 2. CV- Bradycardia, not on BB, on Lisinopril/HCTZ combo for HTN 3. DM- would get Hgb A1c, continue SSIP 4. Hypothyroidism- continue synthroid 5. Morbid Obesity 6.  Dispo- patient chest pain free, likely CABG Tuesday, continue current care per Cardiology  I  spent 40 minutes counseling the patient face to face and 50% or more the  time was spent in counseling and coordination of care. The total time spent in the appointment was 55 minutes.  Denny Peonrin [name] PA-C 05/08/2017 3:26 PM  Patient examined and coronary arteriograms reviewed and counseled with patient. Agree with above assessment by E [name],PA Obese diabetic with severe 3 [id number] CAD Plan CABG tues am, first available opening in OR schedule  P Donata ClayVan trigt, MD

## 2017-05-08 NOTE — Progress Notes (Signed)
TR band oozing at site, 5 ml air re-instilled into tr band, pt stable, VS WNL, no further bleeding noted, will monitor closely

## 2017-05-08 NOTE — Interval H&P Note (Signed)
History and Physical Interval Note:  05/08/2017 10:31 AM  Marlan PalauJeffrey Mcdonald  has presented today for cardiac cath  with the diagnosis of unstable angina/abnormal stress test  The various methods of treatment have been discussed with the patient and family. After consideration of risks, benefits and other options for treatment, the patient has consented to  Procedure(s): Left Heart Cath and Coronary Angiography (N/A) as a surgical intervention .  The patient's history has been reviewed, patient examined, no change in status, stable for surgery.  I have reviewed the patient's chart and labs.  Questions were answered to the patient's satisfaction.    Cath Lab Visit (complete for each Cath Lab visit)  Clinical Evaluation Leading to the Procedure:   ACS: No.  Non-ACS:    Anginal Classification: CCS III  Anti-ischemic medical therapy: No Therapy  Non-Invasive Test Results: High-risk stress test findings: cardiac mortality >3%/year  Prior CABG: No previous CABG         Verne Carrowhristopher McAlhany

## 2017-05-09 ENCOUNTER — Inpatient Hospital Stay (HOSPITAL_COMMUNITY): Payer: BLUE CROSS/BLUE SHIELD

## 2017-05-09 DIAGNOSIS — G4733 Obstructive sleep apnea (adult) (pediatric): Secondary | ICD-10-CM

## 2017-05-09 DIAGNOSIS — E669 Obesity, unspecified: Secondary | ICD-10-CM

## 2017-05-09 DIAGNOSIS — I2510 Atherosclerotic heart disease of native coronary artery without angina pectoris: Secondary | ICD-10-CM

## 2017-05-09 DIAGNOSIS — E7800 Pure hypercholesterolemia, unspecified: Secondary | ICD-10-CM

## 2017-05-09 DIAGNOSIS — E1169 Type 2 diabetes mellitus with other specified complication: Secondary | ICD-10-CM

## 2017-05-09 DIAGNOSIS — Z01810 Encounter for preprocedural cardiovascular examination: Secondary | ICD-10-CM

## 2017-05-09 LAB — COMPREHENSIVE METABOLIC PANEL
ALT: 32 U/L (ref 17–63)
AST: 32 U/L (ref 15–41)
Albumin: 3.1 g/dL — ABNORMAL LOW (ref 3.5–5.0)
Alkaline Phosphatase: 60 U/L (ref 38–126)
Anion gap: 6 (ref 5–15)
BUN: 10 mg/dL (ref 6–20)
CO2: 26 mmol/L (ref 22–32)
Calcium: 8.1 mg/dL — ABNORMAL LOW (ref 8.9–10.3)
Chloride: 106 mmol/L (ref 101–111)
Creatinine, Ser: 0.72 mg/dL (ref 0.61–1.24)
GFR calc Af Amer: >60 mL/min (ref >60)
GFR calc non Af Amer: >60 mL/min (ref >60)
Glucose, Bld: 130 mg/dL — ABNORMAL HIGH (ref 65–99)
Potassium: 3.5 mmol/L (ref 3.5–5.1)
Sodium: 138 mmol/L (ref 135–145)
Total Bilirubin: 0.6 mg/dL (ref 0.3–1.2)
Total Protein: 6.4 g/dL — ABNORMAL LOW (ref 6.5–8.1)

## 2017-05-09 LAB — GLUCOSE, CAPILLARY
GLUCOSE-CAPILLARY: 158 mg/dL — AB (ref 65–99)
GLUCOSE-CAPILLARY: 102 mg/dL — AB (ref 65–99)
GLUCOSE-CAPILLARY: 113 mg/dL — AB (ref 65–99)
Glucose-Capillary: 108 mg/dL — ABNORMAL HIGH (ref 65–99)

## 2017-05-09 LAB — CBC
HCT: 38.9 % — ABNORMAL LOW (ref 39.0–52.0)
HEMOGLOBIN: 13 g/dL (ref 13.0–17.0)
MCH: 29.3 pg (ref 26.0–34.0)
MCHC: 33.4 g/dL (ref 30.0–36.0)
MCV: 87.6 fL (ref 78.0–100.0)
Platelets: 232 10*3/uL (ref 150–400)
RBC: 4.44 MIL/uL (ref 4.22–5.81)
RDW: 13.4 % (ref 11.5–15.5)
WBC: 8.1 10*3/uL (ref 4.0–10.5)

## 2017-05-09 LAB — PROTIME-INR
INR: 1.06
Prothrombin Time: 13.9 seconds (ref 11.4–15.2)

## 2017-05-09 LAB — LIPID PANEL
Cholesterol: 120 mg/dL (ref 0–200)
HDL: 35 mg/dL — ABNORMAL LOW (ref >40)
LDL Cholesterol: 66 mg/dL (ref 0–99)
Total CHOL/HDL Ratio: 3.4 RATIO
Triglycerides: 94 mg/dL (ref <150)
VLDL: 19 mg/dL (ref 0–40)

## 2017-05-09 LAB — TSH: TSH: 0.982 u[IU]/mL (ref 0.350–4.500)

## 2017-05-09 MED ORDER — ENOXAPARIN SODIUM 40 MG/0.4ML ~~LOC~~ SOLN
40.0000 mg | SUBCUTANEOUS | Status: DC
  Administered 2017-05-09 – 2017-05-11 (×3): 40 mg via SUBCUTANEOUS
  Filled 2017-05-09 (×3): qty 0.4

## 2017-05-09 NOTE — Progress Notes (Signed)
Pre-op Cardiac Surgery  Carotid Findings:  1-39% ICA plaquing. Vertebral artery flow is antegrade.   Upper Extremity Right Left  Brachial Pressures 151T 149T  Radial Waveforms T T  Ulnar Waveforms T T  Palmar Arch (Allen's Test) WNL Doppler remains normal with radial compression and obliterates with ulnar compression   Findings:      Lower  Extremity Right Left  Dorsalis Pedis    Anterior Tibial 173T 168T  Posterior Tibial 187T 187T  Ankle/Brachial Indices 1.24  1.24    Findings:  ABIs WNL

## 2017-05-09 NOTE — Progress Notes (Signed)
1 Day Post-Op Procedure(s) (LRB): Left Heart Cath and Coronary Angiography (N/A) Intravascular Ultrasound/IVUS (N/A) Subjective: No angina Bleeding from radial artery stopped Carotid dopplers ok He has severe OSA with O2 sats dropping to 53% at night- place on CPAP preop  Objective: Vital signs in last 24 hours: Temp:  [97.9 F (36.6 C)-98.7 F (37.1 C)] 97.9 F (36.6 C) (05/19 0454) Pulse Rate:  [48-69] 48 (05/19 0454) Cardiac Rhythm: Normal sinus rhythm (05/19 0845) Resp:  [17-20] 20 (05/19 0454) BP: (137-164)/(84-109) 144/84 (05/19 0813) SpO2:  [95 %-99 %] 96 % (05/19 0454) Weight:  [303 lb 1.6 oz (137.5 kg)] 303 lb 1.6 oz (137.5 kg) (05/19 0454)  Hemodynamic parameters for last 24 hours:   nsr Intake/Output from previous day: 05/18 0701 - 05/19 0700 In: 1002 [P.O.:762; I.V.:240] Out: 1500 [Urine:1500] Intake/Output this shift: No intake/output data recorded.       Exam    General- alert and comfortable   Lungs- clear without rales, wheezes   Cor- regular rate and rhythm, no murmur , gallop   Abdomen- soft, non-tender   Extremities - warm, non-tender, minimal edema   Neuro- oriented, appropriate, no focal weakness   Lab Results:  Recent Labs  05/09/17 0254  WBC 8.1  HGB 13.0  HCT 38.9*  PLT 232   BMET:  Recent Labs  05/09/17 0254  NA 138  K 3.5  CL 106  CO2 26  GLUCOSE 130*  BUN 10  CREATININE 0.72  CALCIUM 8.1*    PT/INR:  Recent Labs  05/09/17 0254  LABPROT 13.9  INR 1.06   ABG No results found for: PHART, HCO3, TCO2, ACIDBASEDEF, O2SAT CBG (last 3)   Recent Labs  05/08/17 2224 05/09/17 0718 05/09/17 1126  GLUCAP 117* 108* 158*    Assessment/Plan: S/P Procedure(s) (LRB): Left Heart Cath and Coronary Angiography (N/A) Intravascular Ultrasound/IVUS (N/A) CABG tues am 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

## 2017-05-09 NOTE — Progress Notes (Signed)
Progress Note  Patient Name: Marlan PalauJeffrey Gerard Date of Encounter: 05/09/2017  Primary Cardiologist: Duke Salviaandolph  Subjective   Denies chest pain or dyspnea. Radial artery access site a little tender, but without hematoma.  Inpatient Medications    Scheduled Meds: . aspirin EC  81 mg Oral QPM  . atorvastatin  80 mg Oral QHS  . Chlorhexidine Gluconate Cloth  6 each Topical Q0600  . guaiFENesin  600 mg Oral BID  . lisinopril  20 mg Oral Daily   And  . hydrochlorothiazide  12.5 mg Oral Daily  . insulin aspart  0-15 Units Subcutaneous TID WC  . insulin glargine  20 Units Subcutaneous QHS  . levothyroxine  100 mcg Oral QAC breakfast  . mometasone-formoterol  2 puff Inhalation BID  . mupirocin ointment  1 application Nasal BID  . sertraline  50 mg Oral QPM  . sodium chloride flush  3 mL Intravenous Q12H   Continuous Infusions: . sodium chloride     PRN Meds: sodium chloride, acetaminophen, ALPRAZolam, ondansetron (ZOFRAN) IV, sodium chloride flush, zolpidem   Vital Signs    Vitals:   05/08/17 1404 05/08/17 1700 05/09/17 0454 05/09/17 0813  BP: (!) 147/88 (!) 149/87 (!) 145/88 (!) 144/84  Pulse:  69 (!) 48   Resp:  17 20   Temp:  98.7 F (37.1 C) 97.9 F (36.6 C)   TempSrc:  Oral Oral   SpO2: 95% 98% 96%   Weight:   (!) 303 lb 1.6 oz (137.5 kg)   Height:        Intake/Output Summary (Last 24 hours) at 05/09/17 0931 Last data filed at 05/09/17 0454  Gross per 24 hour  Intake             1002 ml  Output             1500 ml  Net             -498 ml   Filed Weights   05/08/17 0839 05/09/17 0454  Weight: 300 lb (136.1 kg) (!) 303 lb 1.6 oz (137.5 kg)    Telemetry    Normal sinus rhythm - Personally Reviewed  ECG    Normal sinus rhythm - Personally Reviewed  Physical Exam  Comfortable, smiling. Obese GEN: No acute distress.   Neck: No JVD Cardiac: RRR, no murmurs, rubs, or gallops. Small ecchymosis, no hematoma at right radial artery access site Respiratory:  Clear to auscultation bilaterally. GI: Soft, nontender, non-distended  MS: No edema; No deformity. Neuro:  Nonfocal  Psych: Normal affect   Labs    Chemistry Recent Labs Lab 05/04/17 1603 05/09/17 0254  NA 140 138  K 4.2 3.5  CL 105 106  CO2 22 26  GLUCOSE 208* 130*  BUN 10 10  CREATININE 0.91 0.72  CALCIUM 9.2 8.1*  PROT  --  6.4*  ALBUMIN  --  3.1*  AST  --  32  ALT  --  32  ALKPHOS  --  60  BILITOT  --  0.6  GFRNONAA  --  >60  GFRAA  --  >60  ANIONGAP  --  6     Hematology Recent Labs Lab 05/04/17 1603 05/09/17 0254  WBC 9.4 8.1  RBC 5.08 4.44  HGB 15.1 13.0  HCT 45.3 38.9*  MCV 89.2 87.6  MCH 29.7 29.3  MCHC 33.3 33.4  RDW 14.2 13.4  PLT 286 232     Radiology    No results found.  Cardiac  Studies   CATH 05/08/17        Patient Profile     56 y.o. male with obesity, diabetes mellitus, hypertension and hyperlipidemia, recently diagnosed obstructive sleep apnea, found to have severe multivessel coronary artery disease including involvement of the left main and proximal LAD artery. Has preserved left ventricular ejection fraction. Plan for CABG on Tuesday  Assessment & Plan    1. CAD: Bypass surgery on Tuesday. Currently angina free 2. DM: Has very poor eating habits. Discussed diet/nutrition at some length. Discussed glycemic index concept. Reviewed need for regular exercise. 3. HTN: Fair control. Add low-dose carvedilol  . Nocturnal bradycardia related to sleep apnea 4. OSA: He will need BiPAP/CPAP in the postop period. He was at she scheduled for CPAP titration this Friday and does not have home equipment. Need to make arrangements for him to have a CPAP machine at discharge. 5. HLP: On high-dose statin  Signed, Thurmon Fair, MD  05/09/2017, 9:31 AM

## 2017-05-10 DIAGNOSIS — I200 Unstable angina: Secondary | ICD-10-CM

## 2017-05-10 DIAGNOSIS — E782 Mixed hyperlipidemia: Secondary | ICD-10-CM

## 2017-05-10 LAB — VAS US DOPPLER PRE CABG
LEFT ECA DIAS: -26 cm/s
LEFT VERTEBRAL DIAS: -12 cm/s
Left CCA dist dias: -20 cm/s
Left CCA dist sys: -92 cm/s
Left CCA prox dias: 20 cm/s
Left CCA prox sys: 137 cm/s
Left ICA prox dias: -17 cm/s
Left ICA prox sys: -53 cm/s
RIGHT ECA DIAS: -9 cm/s
RIGHT VERTEBRAL DIAS: -13 cm/s
Right CCA prox dias: 14 cm/s
Right CCA prox sys: 115 cm/s
Right cca dist sys: -90 cm/s

## 2017-05-10 LAB — CBC
HCT: 43.9 % (ref 39.0–52.0)
Hemoglobin: 15 g/dL (ref 13.0–17.0)
MCH: 30.1 pg (ref 26.0–34.0)
MCHC: 34.2 g/dL (ref 30.0–36.0)
MCV: 88.2 fL (ref 78.0–100.0)
Platelets: 250 10*3/uL (ref 150–400)
RBC: 4.98 MIL/uL (ref 4.22–5.81)
RDW: 13.5 % (ref 11.5–15.5)
WBC: 9.4 10*3/uL (ref 4.0–10.5)

## 2017-05-10 LAB — HEMOGLOBIN A1C
HEMOGLOBIN A1C: 6.6 % — AB (ref 4.8–5.6)
MEAN PLASMA GLUCOSE: 143 mg/dL

## 2017-05-10 LAB — GLUCOSE, CAPILLARY
GLUCOSE-CAPILLARY: 121 mg/dL — AB (ref 65–99)
GLUCOSE-CAPILLARY: 139 mg/dL — AB (ref 65–99)
Glucose-Capillary: 124 mg/dL — ABNORMAL HIGH (ref 65–99)
Glucose-Capillary: 152 mg/dL — ABNORMAL HIGH (ref 65–99)

## 2017-05-10 LAB — ECHOCARDIOGRAM COMPLETE
Height: 75 in
WEIGHTICAEL: 4849.6 [oz_av]

## 2017-05-10 NOTE — Progress Notes (Signed)
Report received in patient's room via [facility] using SBAR format, reviewed VS, meds, tests and patient's general condition, assumed care of patient.

## 2017-05-10 NOTE — Progress Notes (Signed)
Chest pain totally gone with 1 NTG and O2, EKG shows no significant changes, will continue to monitor. Patient states that he feels much better with the O2, will continue to monitor, VSS at this time with no chest pain.

## 2017-05-10 NOTE — Progress Notes (Signed)
2 Days Post-Op Procedure(s) (LRB): Left Heart Cath and Coronary Angiography (N/A) Intravascular Ultrasound/IVUS (N/A) Subjective: Patient had one episode of angina last night treated with nitroglycerin He is not on heparin Right radial artery site stopped bleeding No symptoms of heart failure  Objective: Vital signs in last 24 hours: Temp:  [97.7 F (36.5 C)-98.1 F (36.7 C)] 98.1 F (36.7 C) (05/20 0407) Pulse Rate:  [54-58] 54 (05/20 0407) Cardiac Rhythm: Normal sinus rhythm (05/20 0820) Resp:  [18-19] 18 (05/20 0407) BP: (126-155)/(72-98) 145/98 (05/20 1100) SpO2:  [99 %-100 %] 99 % (05/20 1100) Weight:  [302 lb 8 oz (137.2 kg)] 302 lb 8 oz (137.2 kg) (05/20 0407)  Hemodynamic parameters for last 24 hours:  stable  Intake/Output from previous day: 05/19 0701 - 05/20 0700 In: 360 [P.O.:360] Out: -  Intake/Output this shift: No intake/output data recorded.  Obese AA male no acute distress Right radial artery site stopped bleeding Lungs clear Sinus rhythm Blood sugars under good control  Lab Results:  Recent Labs  05/09/17 0254 05/10/17 1230  WBC 8.1 9.4  HGB 13.0 15.0  HCT 38.9* 43.9  PLT 232 250   BMET:  Recent Labs  05/09/17 0254  NA 138  K 3.5  CL 106  CO2 26  GLUCOSE 130*  BUN 10  CREATININE 0.72  CALCIUM 8.1*    PT/INR:  Recent Labs  05/09/17 0254  LABPROT 13.9  INR 1.06   ABG No results found for: PHART, HCO3, TCO2, ACIDBASEDEF, O2SAT CBG (last 3)   Recent Labs  05/09/17 2135 05/10/17 0721 05/10/17 1145  GLUCAP 113* 121* 124*    Assessment/Plan: S/P Procedure(s) (LRB): Left Heart Cath and Coronary Angiography (N/A) Intravascular Ultrasound/IVUS (N/A) CABG on Tuesday, May 21 7:30 AM   LOS: 2 days    Kathlee Nationseter Van Trigt III 05/10/2017

## 2017-05-10 NOTE — Progress Notes (Signed)
Patient was having some mild chest discomfort ratd 4/20, non-radiating, placed patient on 2L O2 via N/C, gave 1 S/L NTG and got an EKG, will text page MD with results, VSS, will continue to monitor.

## 2017-05-10 NOTE — Progress Notes (Signed)
Progress Note  Patient Name: [name] PalauJeffrey Hammond Date of Encounter: 05/10/2017  Primary Cardiologist: Dr. Chilton Siiffany Buttonwillow  Subjective   Sitting in bed side chair. No chest pain or shortness of breath.  Inpatient Medications    Scheduled Meds: . aspirin EC  81 mg Oral QPM  . atorvastatin  80 mg Oral QHS  . Chlorhexidine Gluconate Cloth  6 each Topical Q0600  . enoxaparin (LOVENOX) injection  40 mg Subcutaneous Q24H  . guaiFENesin  600 mg Oral BID  . lisinopril  20 mg Oral Daily   And  . hydrochlorothiazide  12.5 mg Oral Daily  . insulin aspart  0-15 Units Subcutaneous TID WC  . insulin glargine  20 Units Subcutaneous QHS  . levothyroxine  100 mcg Oral QAC breakfast  . mometasone-formoterol  2 puff Inhalation BID  . mupirocin ointment  1 application Nasal BID  . sertraline  50 mg Oral QPM  . sodium chloride flush  3 mL Intravenous Q12H   Continuous Infusions: . sodium chloride     PRN Meds: sodium chloride, acetaminophen, ALPRAZolam, ondansetron (ZOFRAN) IV, sodium chloride flush, zolpidem   Vital Signs    Vitals:   05/09/17 1934 05/10/17 0407 05/10/17 0943 05/10/17 1100  BP: 126/72 (!) 147/83 (!) 155/86 (!) 145/98  Pulse: (!) 58 (!) 54    Resp: 19 18    Temp: 97.7 F (36.5 C) 98.1 F (36.7 C)    TempSrc:      SpO2: 99% 100%  99%  Weight:  (!) 302 lb 8 oz (137.2 kg)    Height:        Intake/Output Summary (Last 24 hours) at 05/10/17 1207 Last data filed at 05/10/17 0408  Gross per 24 hour  Intake              360 ml  Output                0 ml  Net              360 ml   Filed Weights   05/08/17 0839 05/09/17 0454 05/10/17 0407  Weight: 300 lb (136.1 kg) (!) 303 lb 1.6 oz (137.5 kg) (!) 302 lb 8 oz (137.2 kg)    Telemetry    Sinus rhythm. Personally reviewed.  ECG    Tracing from 05/09/2017 showed normal sinus rhythm. Personally reviewed.  Physical Exam   GEN: No acute distress.   Neck: No JVD. Cardiac: RRR, no murmur, rub, or gallop.    Respiratory: Nonlabored. Clear to auscultation bilaterally. GI: Soft, nontender, bowel sounds present. MS: No edema; No deformity. Minor ecchymosis right radial access site.  Labs    Chemistry Recent Labs Lab 05/04/17 1603 05/09/17 0254  NA 140 138  K 4.2 3.5  CL 105 106  CO2 22 26  GLUCOSE 208* 130*  BUN 10 10  CREATININE 0.91 0.72  CALCIUM 9.2 8.1*  PROT  --  6.4*  ALBUMIN  --  3.1*  AST  --  32  ALT  --  32  ALKPHOS  --  60  BILITOT  --  0.6  GFRNONAA  --  >60  GFRAA  --  >60  ANIONGAP  --  6     Hematology Recent Labs Lab 05/04/17 1603 05/09/17 0254  WBC 9.4 8.1  RBC 5.08 4.44  HGB 15.1 13.0  HCT 45.3 38.9*  MCV 89.2 87.6  MCH 29.7 29.3  MCHC 33.3 33.4  RDW 14.2 13.4  PLT 286  232    Radiology    Dg Chest 2 View  Result Date: 05/09/2017 CLINICAL DATA:  Chest pain in the morning. EXAM: CHEST  2 VIEW COMPARISON:  09/02/2004. FINDINGS: Trachea is midline. Heart is at the upper limits of normal in size. Lungs are clear. No pleural fluid. IMPRESSION: No acute findings. Electronically Signed   By: Leanna Battles M.D.   On: 05/09/2017 09:43    Cardiac Studies   Echocardiogram 05/09/2017: Study Conclusions  - Left ventricle: The cavity size was normal. Wall thickness was   increased in a pattern of mild LVH. Systolic function was normal.   The estimated ejection fraction was in the range of 60% to 65%.   Wall motion was normal; there were no regional wall motion   abnormalities. Left ventricular diastolic function parameters   were normal. - Tricuspid valve: There was trivial regurgitation. - Pulmonary arteries: Systolic pressure could not be accurately   estimated. - Pericardium, extracardiac: There was no pericardial effusion.  Impressions:  - Mild LVH with LVEF 60-65% and normal diastolic function. Trivial   tricuspid regurgitation.  Cardiac catheterization 05/08/2017:  Mid RCA lesion, 10 %stenosed.  Ost LM to LM lesion, 50  %stenosed.  Ost Cx to Prox Cx lesion, 60 %stenosed.  Ost LAD to Prox LAD lesion, 90 %stenosed.  LM lesion, 50 %stenosed.  3rd Mrg lesion, 30 %stenosed.  The left ventricular ejection fraction is greater than 65% by visual estimate.  The left ventricular systolic function is normal.  LV end diastolic pressure is normal.  There is no mitral valve regurgitation.  Ost 1st Diag to 1st Diag lesion, 50 %stenosed.   1. Severe ostial LAD stenosis with moderate stenosis in the distal left main artery and ostial Circumflex artery. This is confirmed with IVUS imaging.  2. Mild plaque in the mid RCA and third OM branch 3. Moderate stenosis in the first diagonal branch 4. Normal LV systolic function  Patient Profile     56 y.o. male with history of type 2 diabetes mellitus, hypertension, hyperlipidemia, obesity, and recently diagnosed OSA. He has left main disease also involving the ostial LAD and circumflex. Plan is for CABG tentatively on Tuesday.  Assessment & Plan    1. Left main disease involving the ostial LAD and circumflex with plan for CABG on Tuesday with Dr. Donata Clay.  2. Essential hypertension, systolic blood pressure in the 140s to 150s. Currently on lisinopril and HCTZ.  3. Obstructive sleep apnea. He was actually scheduled for a CPAP titration week. Need to ensure that he has adequate equipment at discharge.  4. Hyperlipidemia, on Lipitor.  5. Morbid obesity.  Continue aspirin, Lipitor, Lovenox, lisinopril, and HCTZ. Patient is steadily scheduled for CABG on Tuesday with Dr. Donata Clay.  Signed, Nona Dell, MD  05/10/2017, 12:07 PM

## 2017-05-11 ENCOUNTER — Inpatient Hospital Stay (HOSPITAL_COMMUNITY): Payer: BLUE CROSS/BLUE SHIELD

## 2017-05-11 ENCOUNTER — Other Ambulatory Visit (HOSPITAL_COMMUNITY): Payer: Self-pay | Admitting: Respiratory Therapy

## 2017-05-11 LAB — PREPARE RBC (CROSSMATCH)

## 2017-05-11 LAB — PULMONARY FUNCTION TEST
DL/VA % pred: 94 %
DL/VA: 4.61 ml/min/mmHg/L
DLCO cor % pred: 61 %
DLCO cor: 23.98 ml/min/mmHg
DLCO unc % pred: 62 %
DLCO unc: 24.25 ml/min/mmHg
FEF 25-75 Post: 4.19 L/sec
FEF 25-75 Pre: 3.95 L/sec
FEF2575-%Change-Post: 5 %
FEF2575-%Pred-Post: 117 %
FEF2575-%Pred-Pre: 111 %
FEV1-%Change-Post: 0 %
FEV1-%Pred-Post: 88 %
FEV1-%Pred-Pre: 87 %
FEV1-Post: 3.38 L
FEV1-Pre: 3.35 L
FEV1FVC-%Change-Post: 3 %
FEV1FVC-%Pred-Pre: 106 %
FEV6-%Change-Post: -2 %
FEV6-%Pred-Post: 81 %
FEV6-%Pred-Pre: 83 %
FEV6-Post: 3.86 L
FEV6-Pre: 3.95 L
FEV6FVC-%Change-Post: 0 %
FEV6FVC-%Pred-Post: 103 %
FEV6FVC-%Pred-Pre: 103 %
FVC-%Change-Post: -2 %
FVC-%Pred-Post: 79 %
FVC-%Pred-Pre: 81 %
FVC-Post: 3.86 L
FVC-Pre: 3.98 L
Post FEV1/FVC ratio: 87 %
Post FEV6/FVC ratio: 100 %
Pre FEV1/FVC ratio: 84 %
Pre FEV6/FVC Ratio: 100 %
RV % pred: 72 %
RV: 1.78 L
TLC % pred: 72 %
TLC: 5.8 L

## 2017-05-11 LAB — BASIC METABOLIC PANEL
Anion gap: 9 (ref 5–15)
BUN: 10 mg/dL (ref 6–20)
CHLORIDE: 104 mmol/L (ref 101–111)
CO2: 22 mmol/L (ref 22–32)
Calcium: 8.8 mg/dL — ABNORMAL LOW (ref 8.9–10.3)
Creatinine, Ser: 0.72 mg/dL (ref 0.61–1.24)
Glucose, Bld: 119 mg/dL — ABNORMAL HIGH (ref 65–99)
Potassium: 4.2 mmol/L (ref 3.5–5.1)
SODIUM: 135 mmol/L (ref 135–145)

## 2017-05-11 LAB — ABO/RH: ABO/RH(D): O POS

## 2017-05-11 LAB — GLUCOSE, CAPILLARY
GLUCOSE-CAPILLARY: 94 mg/dL (ref 65–99)
GLUCOSE-CAPILLARY: 166 mg/dL — AB (ref 65–99)
Glucose-Capillary: 101 mg/dL — ABNORMAL HIGH (ref 65–99)
Glucose-Capillary: 128 mg/dL — ABNORMAL HIGH (ref 65–99)

## 2017-05-11 LAB — PLATELET INHIBITION P2Y12: Platelet Function  P2Y12: 159 [PRU] — ABNORMAL LOW (ref 194–418)

## 2017-05-11 MED ORDER — SODIUM CHLORIDE 0.9 % IV SOLN
INTRAVENOUS | Status: AC
  Administered 2017-05-12: 1.4 [IU]/h via INTRAVENOUS
  Filled 2017-05-11: qty 1

## 2017-05-11 MED ORDER — EPINEPHRINE PF 1 MG/ML IJ SOLN
0.0000 ug/min | INTRAVENOUS | Status: DC
  Filled 2017-05-11: qty 4

## 2017-05-11 MED ORDER — METOPROLOL TARTRATE 12.5 MG HALF TABLET
12.5000 mg | ORAL_TABLET | Freq: Once | ORAL | Status: AC
  Administered 2017-05-12: 12.5 mg via ORAL
  Filled 2017-05-11: qty 1

## 2017-05-11 MED ORDER — TEMAZEPAM 15 MG PO CAPS: 15.0000 mg | ORAL_CAPSULE | Freq: Once | ORAL | Status: DC | PRN

## 2017-05-11 MED ORDER — BISACODYL 5 MG PO TBEC
5.0000 mg | DELAYED_RELEASE_TABLET | Freq: Once | ORAL | Status: AC
  Administered 2017-05-11: 5 mg via ORAL
  Filled 2017-05-11: qty 1

## 2017-05-11 MED ORDER — POTASSIUM CHLORIDE 2 MEQ/ML IV SOLN
80.0000 meq | INTRAVENOUS | Status: DC
  Filled 2017-05-11: qty 40

## 2017-05-11 MED ORDER — VANCOMYCIN HCL 10 G IV SOLR
1500.0000 mg | INTRAVENOUS | Status: AC
  Administered 2017-05-12: 1500 mg via INTRAVENOUS
  Filled 2017-05-11 (×2): qty 1500

## 2017-05-11 MED ORDER — DIAZEPAM 5 MG PO TABS
5.0000 mg | ORAL_TABLET | Freq: Once | ORAL | Status: AC
  Administered 2017-05-12: 5 mg via ORAL
  Filled 2017-05-11: qty 1

## 2017-05-11 MED ORDER — SODIUM CHLORIDE 0.9 % IV SOLN
30.0000 ug/min | INTRAVENOUS | Status: DC
  Filled 2017-05-11: qty 2

## 2017-05-11 MED ORDER — CHLORHEXIDINE GLUCONATE 4 % EX LIQD
CUTANEOUS | Status: AC
  Filled 2017-05-11: qty 15

## 2017-05-11 MED ORDER — CHLORHEXIDINE GLUCONATE 4 % EX LIQD
60.0000 mL | Freq: Once | CUTANEOUS | Status: AC
  Administered 2017-05-11: 4 via TOPICAL

## 2017-05-11 MED ORDER — TRANEXAMIC ACID (OHS) BOLUS VIA INFUSION
15.0000 mg/kg | INTRAVENOUS | Status: AC
  Administered 2017-05-12: 2052 mg via INTRAVENOUS
  Filled 2017-05-11: qty 2052

## 2017-05-11 MED ORDER — TRANEXAMIC ACID (OHS) PUMP PRIME SOLUTION
2.0000 mg/kg | INTRAVENOUS | Status: DC
  Filled 2017-05-11: qty 2.74

## 2017-05-11 MED ORDER — ALBUTEROL SULFATE (2.5 MG/3ML) 0.083% IN NEBU
2.5000 mg | INHALATION_SOLUTION | Freq: Once | RESPIRATORY_TRACT | Status: AC
  Administered 2017-05-11: 2.5 mg via RESPIRATORY_TRACT

## 2017-05-11 MED ORDER — PLASMA-LYTE 148 IV SOLN
INTRAVENOUS | Status: AC
  Administered 2017-05-12: 500 mL
  Filled 2017-05-11: qty 2.5

## 2017-05-11 MED ORDER — DEXMEDETOMIDINE HCL IN NACL 400 MCG/100ML IV SOLN
0.1000 ug/kg/h | INTRAVENOUS | Status: AC
  Administered 2017-05-12: 0.7 ug/kg/h via INTRAVENOUS
  Filled 2017-05-11: qty 100

## 2017-05-11 MED ORDER — CHLORHEXIDINE GLUCONATE 4 % EX LIQD
60.0000 mL | Freq: Once | CUTANEOUS | Status: AC
  Administered 2017-05-12: 4 via TOPICAL
  Filled 2017-05-11: qty 60

## 2017-05-11 MED ORDER — DEXTROSE 5 % IV SOLN
1.5000 g | INTRAVENOUS | Status: AC
  Administered 2017-05-12: 1.5 g via INTRAVENOUS
  Administered 2017-05-12: .75 g via INTRAVENOUS
  Filled 2017-05-11: qty 1.5

## 2017-05-11 MED ORDER — DEXTROSE 5 % IV SOLN
750.0000 mg | INTRAVENOUS | Status: DC
  Filled 2017-05-11: qty 750

## 2017-05-11 MED ORDER — CHLORHEXIDINE GLUCONATE 0.12 % MT SOLN
15.0000 mL | Freq: Once | OROMUCOSAL | Status: AC
  Administered 2017-05-12: 15 mL via OROMUCOSAL
  Filled 2017-05-11: qty 15

## 2017-05-11 MED ORDER — MAGNESIUM SULFATE 50 % IJ SOLN
40.0000 meq | INTRAMUSCULAR | Status: DC
Start: 2017-05-12 — End: 2017-05-12
  Filled 2017-05-11: qty 10

## 2017-05-11 MED ORDER — NITROGLYCERIN IN D5W 200-5 MCG/ML-% IV SOLN
2.0000 ug/min | INTRAVENOUS | Status: DC
  Filled 2017-05-11: qty 250

## 2017-05-11 MED ORDER — HEPARIN SODIUM (PORCINE) 1000 UNIT/ML IJ SOLN
INTRAMUSCULAR | Status: DC
  Filled 2017-05-11: qty 30

## 2017-05-11 MED ORDER — TRANEXAMIC ACID 1000 MG/10ML IV SOLN
1.5000 mg/kg/h | INTRAVENOUS | Status: AC
  Administered 2017-05-12: 1.5 mg/kg/h via INTRAVENOUS
  Filled 2017-05-11: qty 25

## 2017-05-11 MED ORDER — DOPAMINE-DEXTROSE 3.2-5 MG/ML-% IV SOLN
0.0000 ug/kg/min | INTRAVENOUS | Status: AC
  Administered 2017-05-12: 2.5 ug/kg/min via INTRAVENOUS
  Filled 2017-05-11: qty 250

## 2017-05-11 MED FILL — Verapamil HCl IV Soln 2.5 MG/ML: INTRAVENOUS | Qty: 2 | Status: AC

## 2017-05-11 NOTE — Anesthesia Preprocedure Evaluation (Addendum)
Anesthesia Evaluation  Patient identified by MRN, date of birth, ID band Patient awake    Reviewed: Allergy & Precautions, NPO status , Patient's Chart, lab work & pertinent test results  Airway Mallampati: III  TM Distance: >3 FB Neck ROM: Full    Dental  (+) Dental Advisory Given   Pulmonary sleep apnea ,    breath sounds clear to auscultation       Cardiovascular hypertension, Pt. on medications + angina + CAD and + Peripheral Vascular Disease   Rhythm:Regular Rate:Normal  Mild LVH with LVEF 60-65% and normal diastolic function. Trivial  tricuspid regurgitation.   Neuro/Psych negative neurological ROS     GI/Hepatic negative GI ROS, Neg liver ROS,   Endo/Other  diabetes, Type 2Hypothyroidism   Renal/GU negative Renal ROS     Musculoskeletal   Abdominal   Peds  Hematology negative hematology ROS (+)   Anesthesia Other Findings   Reproductive/Obstetrics                            Lab Results  Component Value Date   WBC 9.4 05/10/2017   HGB 15.0 05/10/2017   HCT 43.9 05/10/2017   MCV 88.2 05/10/2017   PLT 250 05/10/2017   Lab Results  Component Value Date   CREATININE 0.72 05/11/2017   BUN 10 05/11/2017   NA 135 05/11/2017   K 4.2 05/11/2017   CL 104 05/11/2017   CO2 22 05/11/2017    Anesthesia Physical Anesthesia Plan  ASA: IV  Anesthesia Plan: General   Post-op Pain Management:    Induction: Intravenous  Airway Management Planned: Oral ETT  Additional Equipment: PA Cath, Ultrasound Guidance Line Placement, CVP, Arterial line and TEE  Intra-op Plan:   Post-operative Plan: Post-operative intubation/ventilation  Informed Consent: I have reviewed the patients History and Physical, chart, labs and discussed the procedure including the risks, benefits and alternatives for the proposed anesthesia with the patient or authorized representative who has indicated his/her  understanding and acceptance.     Plan Discussed with: CRNA  Anesthesia Plan Comments:        Anesthesia Quick Evaluation

## 2017-05-11 NOTE — Plan of Care (Signed)
Problem: Safety: Goal: Ability to remain free from injury will improve Outcome: Completed/Met Date Met: 05/11/17 Patient uses call light appropriately and his wife says with him over night every night. Patient has his personal items within reach on the bedside stand. Patient is able to use the phone and is aware of how to call RN/NT and uses it as needed.

## 2017-05-11 NOTE — Progress Notes (Signed)
Updated report received via Environmental education officerCesar RN in patient's room using SBAR format, reviewed VS, new orders and events of the day, assumed care of patient.

## 2017-05-11 NOTE — Plan of Care (Signed)
Problem: Education: Goal: Knowledge of [facility] Education information/materials will improve Outcome: Progressing Patient was given New Admission booklet and also reviewed unit rules and information was given regarding Open Heart procedure, viewed videos and all questions answered appropriately

## 2017-05-11 NOTE — Progress Notes (Signed)
Discussed sternal precautions, mobility, IS (and practiced, can do 2300 mL), and d/c planning. Pt and wife voiced understanding. She will be with him at d/c. Gave OHS video, careguide, and booklet. He has walked around unit and can do more today. [id number] Ethelda ChickKristan Karel Mowers CES, ACSM 11:11 AM 05/11/2017

## 2017-05-11 NOTE — Progress Notes (Signed)
Progress Note  Patient Name: [name] PalauJeffrey Hammond Date of Encounter: 05/11/2017  Primary Cardiologist: Dr. Duke Salviaandolph  Subjective  No chest pain overnight - had some indigestion yesterday. Nervous about surgery tomorrow.   Inpatient Medications    Scheduled Meds: . aspirin EC  81 mg Oral QPM  . atorvastatin  80 mg Oral QHS  . Chlorhexidine Gluconate Cloth  6 each Topical Q0600  . enoxaparin (LOVENOX) injection  40 mg Subcutaneous Q24H  . guaiFENesin  600 mg Oral BID  . lisinopril  20 mg Oral Daily   And  . hydrochlorothiazide  12.5 mg Oral Daily  . insulin aspart  0-15 Units Subcutaneous TID WC  . insulin glargine  20 Units Subcutaneous QHS  . levothyroxine  100 mcg Oral QAC breakfast  . mometasone-formoterol  2 puff Inhalation BID  . mupirocin ointment  1 application Nasal BID  . sertraline  50 mg Oral QPM  . sodium chloride flush  3 mL Intravenous Q12H   Continuous Infusions: . sodium chloride     PRN Meds: sodium chloride, acetaminophen, ALPRAZolam, ondansetron (ZOFRAN) IV, sodium chloride flush, zolpidem   Vital Signs    Vitals:   05/10/17 1446 05/10/17 2112 05/10/17 2320 05/11/17 0531  BP: 133/89 (!) 143/76  137/84  Pulse: 60 (!) 58 (!) 55 (!) 54  Resp: 18 19  19   Temp: 98 F (36.7 C) 98.8 F (37.1 C)  98.2 F (36.8 C)  TempSrc: Oral     SpO2: 97% 98%  98%  Weight:    (!) 301 lb 9.6 oz (136.8 kg)  Height:        Intake/Output Summary (Last 24 hours) at 05/11/17 0853 Last data filed at 05/11/17 0532  Gross per 24 hour  Intake              360 ml  Output              300 ml  Net               60 ml   Filed Weights   05/09/17 0454 05/10/17 0407 05/11/17 0531  Weight: (!) 303 lb 1.6 oz (137.5 kg) (!) 302 lb 8 oz (137.2 kg) (!) 301 lb 9.6 oz (136.8 kg)    Telemetry    Normal sinus rhythm - Personally Reviewed   Physical Exam   GEN: Well nourished, well developed, NAD  HEENT: normal  Neck: no JVD, carotid bruits, or masses Cardiac: RRR. no murmurs,  rubs, or gallops,no edema. Intact distal pulses bilaterally.  Respiratory: clear to auscultation bilaterally, normal work of breathing GI: soft, nontender, nondistended, + BS MS: no deformity or atrophy  Skin: warm and dry, no rash - radial cath site intact, no bruit or eccyhmosis Neuro: Alert and Oriented x 3, Strength and sensation are intact Psych:   Full affect  Labs    Chemistry Recent Labs Lab 05/04/17 1603 05/09/17 0254  NA 140 138  K 4.2 3.5  CL 105 106  CO2 22 26  GLUCOSE 208* 130*  BUN 10 10  CREATININE 0.91 0.72  CALCIUM 9.2 8.1*  PROT  --  6.4*  ALBUMIN  --  3.1*  AST  --  32  ALT  --  32  ALKPHOS  --  60  BILITOT  --  0.6  GFRNONAA  --  >60  GFRAA  --  >60  ANIONGAP  --  6     Hematology Recent Labs Lab 05/04/17 1603 05/09/17 0254 05/10/17  1230  WBC 9.4 8.1 9.4  RBC 5.08 4.44 4.98  HGB 15.1 13.0 15.0  HCT 45.3 38.9* 43.9  MCV 89.2 87.6 88.2  MCH 29.7 29.3 30.1  MCHC 33.3 33.4 34.2  RDW 14.2 13.4 13.5  PLT 286 232 250     Radiology    Dg Chest 2 View  Result Date: 05/09/2017 CLINICAL DATA:  Chest pain in the morning. EXAM: CHEST  2 VIEW COMPARISON:  09/02/2004. FINDINGS: Trachea is midline. Heart is at the upper limits of normal in size. Lungs are clear. No pleural fluid. IMPRESSION: No acute findings. Electronically Signed   By: Leanna Battles M.D.   On: 05/09/2017 09:43    Cardiac Studies   Intravascular Ultrasound Left Heart Cath and Coronary Angiography 05/08/2017  Conclusion     Mid RCA lesion, 10 %stenosed.  Ost LM to LM lesion, 50 %stenosed.  Ost Cx to Prox Cx lesion, 60 %stenosed.  Ost LAD to Prox LAD lesion, 90 %stenosed.  LM lesion, 50 %stenosed.  3rd Mrg lesion, 30 %stenosed.  The left ventricular ejection fraction is greater than 65% by visual estimate.  The left ventricular systolic function is normal.  LV end diastolic pressure is normal.  There is no mitral valve regurgitation.  Ost 1st Diag to 1st  Diag lesion, 50 %stenosed.   1. Severe ostial LAD stenosis with moderate stenosis in the distal left main artery and ostial Circumflex artery. This is confirmed with IVUS imaging.  2. Mild plaque in the mid RCA and third OM branch 3. Moderate stenosis in the first diagonal branch 4. Normal LV systolic function  Recommendations: I do not think PCI of the ostial LAD is a good option given involvement of the distal left main artery and Circumflex artery. I think CABG is the best option for revascularization. I will ask CT surgery to see him to discuss CABG. Continue ASA, statin. Start beta blocker. Will arrange echo.      Transthoracic Echocardiography  05/09/2017  Study Conclusions  - Left ventricle: The cavity size was normal. Wall thickness was   increased in a pattern of mild LVH. Systolic function was normal.   The estimated ejection fraction was in the range of 60% to 65%.   Wall motion was normal; there were no regional wall motion   abnormalities. Left ventricular diastolic function parameters   were normal. - Tricuspid valve: There was trivial regurgitation. - Pulmonary arteries: Systolic pressure could not be accurately   estimated. - Pericardium, extracardiac: There was no pericardial effusion.  Impressions:  - Mild LVH with LVEF 60-65% and normal diastolic function. Trivial   tricuspid regurgitation.  Patient Profile     56 y.o. male with obesity, diabetes mellitus, hypertension and hyperlipidemia, recently diagnosed obstructive sleep apnea, found to have severe multivessel coronary artery disease including involvement of the left main and proximal LAD artery. Has preserved left ventricular ejection fraction. Plan for CABG on May 11, 2017  Assessment & Plan     Blood Pressure: 137            Hr: 54 Labs:  Lytes not checked since 5/19  WBC 9.4, Hbg 15  1. CAD:  Bypass surgery pending for Tuesday March 11, 2017, currently pain free.  2. DM: Has been educated  on diet/nutrition importance. Discussed need for exercise.  3. HTN: fair control, on low dose carvedilol. Nocturnal bradycardia related to sleep apnea.  4. OSA:  Need to make arrangements for him to have a CPAP machine  at discharge. Needs BiPap machine this admission post op but because sats dropped to 53% at night, started on BiPap a few days ago. 5. HLP: On high-dose statin, continue.  Chrystie Nose, MD, [facility]    [facility]  Attending Cardiologist  Direct Dial: [phone]  Fax: [phone]  Website:  [email or web address]

## 2017-05-11 NOTE — Progress Notes (Signed)
Patient not ready for CPAP at this time. Patient will call when ready.  

## 2017-05-11 NOTE — Care Management Note (Signed)
Case Management Note  Patient Details  Name: Marlan PalauJeffrey Perkey MRN: [id number] Date of Birth: 03/26/1961  Subjective/Objective:  Pt presented for chest pain-post cardiac cath 05-08-17 revealed severe multivessel CAD. Plan for CABG Tuesday 05-12-17. Pt is from home with wife. PTA pt was working and independent.                  Action/Plan: CM will continue to monitor for disposition needs post procedure   Expected Discharge Date:                  Expected Discharge Plan:  Home w Home Health Services  In-House Referral:     Discharge planning Services  CM Consult  Post Acute Care Choice:    Choice offered to:     DME Arranged:    DME Agency:     HH Arranged:    HH Agency:     Status of Service:  In process, will continue to follow  If discussed at Long Length of Stay Meetings, dates discussed:    Additional Comments:  Gala LewandowskyGraves-Bigelow, Jaylia Pettus Kaye, RN 05/11/2017, 11:45 AM

## 2017-05-11 NOTE — Plan of Care (Signed)
Problem: Education: Goal: Understanding of CV disease, CV risk reduction, and recovery process will improve Outcome: Progressing Patient is scheduled for CABG on Tuesday, reviewed what the surgery intailed, what to expect and information to be able to view videos regarding Open Heart surgery before and after care, answered questions of patient and his wife, will continue to instruct and use teach back technique to make sure the patient and his spouse understand.

## 2017-05-12 ENCOUNTER — Inpatient Hospital Stay (HOSPITAL_COMMUNITY): Payer: BLUE CROSS/BLUE SHIELD | Admitting: Anesthesiology

## 2017-05-12 ENCOUNTER — Encounter (HOSPITAL_COMMUNITY)
Admission: AD | Disposition: A | Discharge status: Home Health | Payer: Self-pay | Source: Ambulatory Visit | Attending: Cardiothoracic Surgery

## 2017-05-12 ENCOUNTER — Inpatient Hospital Stay (HOSPITAL_COMMUNITY): Payer: BLUE CROSS/BLUE SHIELD

## 2017-05-12 DIAGNOSIS — I2510 Atherosclerotic heart disease of native coronary artery without angina pectoris: Secondary | ICD-10-CM | POA: Diagnosis present

## 2017-05-12 HISTORY — PX: CORONARY ARTERY BYPASS GRAFT: SHX141

## 2017-05-12 HISTORY — PX: TEE WITHOUT CARDIOVERSION: SHX5443

## 2017-05-12 LAB — POCT I-STAT, CHEM 8
BUN: 13 mg/dL (ref 6–20)
BUN: 11 mg/dL (ref 6–20)
BUN: 11 mg/dL (ref 6–20)
BUN: 14 mg/dL (ref 6–20)
BUN: 11 mg/dL (ref 6–20)
BUN: 12 mg/dL (ref 6–20)
BUN: 12 mg/dL (ref 6–20)
CHLORIDE: 107 mmol/L (ref 101–111)
CHLORIDE: 105 mmol/L (ref 101–111)
CREATININE: 0.5 mg/dL — AB (ref 0.61–1.24)
CREATININE: 0.6 mg/dL — AB (ref 0.61–1.24)
CREATININE: 0.7 mg/dL (ref 0.61–1.24)
CREATININE: 0.6 mg/dL — AB (ref 0.61–1.24)
CREATININE: 0.7 mg/dL (ref 0.61–1.24)
Calcium, Ion: 1.21 mmol/L (ref 1.15–1.40)
Calcium, Ion: 1.13 mmol/L — ABNORMAL LOW (ref 1.15–1.40)
Calcium, Ion: 1.05 mmol/L — ABNORMAL LOW (ref 1.15–1.40)
Calcium, Ion: 1.15 mmol/L (ref 1.15–1.40)
Calcium, Ion: 1.01 mmol/L — ABNORMAL LOW (ref 1.15–1.40)
Calcium, Ion: 0.99 mmol/L — ABNORMAL LOW (ref 1.15–1.40)
Calcium, Ion: 1.09 mmol/L — ABNORMAL LOW (ref 1.15–1.40)
Chloride: 103 mmol/L (ref 101–111)
Chloride: 98 mmol/L — ABNORMAL LOW (ref 101–111)
Chloride: 99 mmol/L — ABNORMAL LOW (ref 101–111)
Chloride: 99 mmol/L — ABNORMAL LOW (ref 101–111)
Chloride: 105 mmol/L (ref 101–111)
Creatinine, Ser: 0.7 mg/dL (ref 0.61–1.24)
Creatinine, Ser: 0.6 mg/dL — ABNORMAL LOW (ref 0.61–1.24)
GLUCOSE: 94 mg/dL (ref 65–99)
GLUCOSE: 150 mg/dL — AB (ref 65–99)
GLUCOSE: 148 mg/dL — AB (ref 65–99)
Glucose, Bld: 132 mg/dL — ABNORMAL HIGH (ref 65–99)
Glucose, Bld: 96 mg/dL (ref 65–99)
Glucose, Bld: 105 mg/dL — ABNORMAL HIGH (ref 65–99)
Glucose, Bld: 104 mg/dL — ABNORMAL HIGH (ref 65–99)
HCT: 32 % — ABNORMAL LOW (ref 39.0–52.0)
HCT: 40 % (ref 39.0–52.0)
HCT: 30 % — ABNORMAL LOW (ref 39.0–52.0)
HEMATOCRIT: 41 % (ref 39.0–52.0)
HEMATOCRIT: 38 % — AB (ref 39.0–52.0)
HEMATOCRIT: 28 % — AB (ref 39.0–52.0)
HEMATOCRIT: 34 % — AB (ref 39.0–52.0)
HEMOGLOBIN: 13.9 g/dL (ref 13.0–17.0)
HEMOGLOBIN: 12.9 g/dL — AB (ref 13.0–17.0)
HEMOGLOBIN: 10.2 g/dL — AB (ref 13.0–17.0)
HEMOGLOBIN: 9.5 g/dL — AB (ref 13.0–17.0)
HEMOGLOBIN: 11.6 g/dL — AB (ref 13.0–17.0)
Hemoglobin: 10.9 g/dL — ABNORMAL LOW (ref 13.0–17.0)
Hemoglobin: 13.6 g/dL (ref 13.0–17.0)
POTASSIUM: 4.6 mmol/L (ref 3.5–5.1)
POTASSIUM: 3.8 mmol/L (ref 3.5–5.1)
POTASSIUM: 4.1 mmol/L (ref 3.5–5.1)
Potassium: 3.5 mmol/L (ref 3.5–5.1)
Potassium: 3.9 mmol/L (ref 3.5–5.1)
Potassium: 4.1 mmol/L (ref 3.5–5.1)
Potassium: 4.4 mmol/L (ref 3.5–5.1)
SODIUM: 138 mmol/L (ref 135–145)
SODIUM: 139 mmol/L (ref 135–145)
SODIUM: 139 mmol/L (ref 135–145)
SODIUM: 142 mmol/L (ref 135–145)
Sodium: 140 mmol/L (ref 135–145)
Sodium: 140 mmol/L (ref 135–145)
Sodium: 140 mmol/L (ref 135–145)
TCO2: 28 mmol/L (ref 0–100)
TCO2: 29 mmol/L (ref 0–100)
TCO2: 30 mmol/L (ref 0–100)
TCO2: 30 mmol/L (ref 0–100)
TCO2: 29 mmol/L (ref 0–100)
TCO2: 27 mmol/L (ref 0–100)
TCO2: 25 mmol/L (ref 0–100)

## 2017-05-12 LAB — BASIC METABOLIC PANEL
Anion gap: 7 (ref 5–15)
BUN: 13 mg/dL (ref 6–20)
CO2: 27 mmol/L (ref 22–32)
Calcium: 8.6 mg/dL — ABNORMAL LOW (ref 8.9–10.3)
Chloride: 104 mmol/L (ref 101–111)
Creatinine, Ser: 0.84 mg/dL (ref 0.61–1.24)
GFR calc Af Amer: >60 mL/min (ref >60)
GFR calc non Af Amer: >60 mL/min (ref >60)
Glucose, Bld: 112 mg/dL — ABNORMAL HIGH (ref 65–99)
Potassium: 3.9 mmol/L (ref 3.5–5.1)
Sodium: 138 mmol/L (ref 135–145)

## 2017-05-12 LAB — POCT I-STAT 3, ART BLOOD GAS (G3+)
ACID-BASE DEFICIT: 3 mmol/L — AB (ref 0.0–2.0)
ACID-BASE EXCESS: 3 mmol/L — AB (ref 0.0–2.0)
Acid-Base Excess: 1 mmol/L (ref 0.0–2.0)
Acid-base deficit: 1 mmol/L (ref 0.0–2.0)
Acid-base deficit: 3 mmol/L — ABNORMAL HIGH (ref 0.0–2.0)
Acid-base deficit: 2 mmol/L (ref 0.0–2.0)
BICARBONATE: 24.7 mmol/L (ref 20.0–28.0)
BICARBONATE: 23.1 mmol/L (ref 20.0–28.0)
Bicarbonate: 27.9 mmol/L (ref 20.0–28.0)
Bicarbonate: 26.3 mmol/L (ref 20.0–28.0)
Bicarbonate: 24 mmol/L (ref 20.0–28.0)
Bicarbonate: 22.8 mmol/L (ref 20.0–28.0)
O2 SAT: 100 %
O2 SAT: 98 %
O2 SAT: 97 %
O2 SAT: 95 %
O2 Saturation: 100 %
O2 Saturation: 99 %
PCO2 ART: 43.4 mmHg (ref 32.0–48.0)
PCO2 ART: 46.2 mmHg (ref 32.0–48.0)
PH ART: 7.31 — AB (ref 7.350–7.450)
PO2 ART: 290 mmHg — AB (ref 83.0–108.0)
PO2 ART: 252 mmHg — AB (ref 83.0–108.0)
PO2 ART: 104 mmHg (ref 83.0–108.0)
PO2 ART: 85 mmHg (ref 83.0–108.0)
Patient temperature: 36.7
Patient temperature: 37
TCO2: 29 mmol/L (ref 0–100)
TCO2: 28 mmol/L (ref 0–100)
TCO2: 26 mmol/L (ref 0–100)
TCO2: 25 mmol/L (ref 0–100)
TCO2: 25 mmol/L (ref 0–100)
TCO2: 24 mmol/L (ref 0–100)
pCO2 arterial: 41.1 mmHg (ref 32.0–48.0)
pCO2 arterial: 44.1 mmHg (ref 32.0–48.0)
pCO2 arterial: 47.7 mmHg (ref 32.0–48.0)
pCO2 arterial: 44.5 mmHg (ref 32.0–48.0)
pH, Arterial: 7.439 (ref 7.350–7.450)
pH, Arterial: 7.39 (ref 7.350–7.450)
pH, Arterial: 7.354 (ref 7.350–7.450)
pH, Arterial: 7.306 — ABNORMAL LOW (ref 7.350–7.450)
pH, Arterial: 7.319 — ABNORMAL LOW (ref 7.350–7.450)
pO2, Arterial: 112 mmHg — ABNORMAL HIGH (ref 83.0–108.0)
pO2, Arterial: 128 mmHg — ABNORMAL HIGH (ref 83.0–108.0)

## 2017-05-12 LAB — CBC
HCT: 41.5 % (ref 39.0–52.0)
HCT: 37.3 % — ABNORMAL LOW (ref 39.0–52.0)
HCT: 34.7 % — ABNORMAL LOW (ref 39.0–52.0)
HEMOGLOBIN: 12.3 g/dL — AB (ref 13.0–17.0)
Hemoglobin: 13.9 g/dL (ref 13.0–17.0)
Hemoglobin: 11.8 g/dL — ABNORMAL LOW (ref 13.0–17.0)
MCH: 29.4 pg (ref 26.0–34.0)
MCH: 28.9 pg (ref 26.0–34.0)
MCH: 29.9 pg (ref 26.0–34.0)
MCHC: 33.5 g/dL (ref 30.0–36.0)
MCHC: 33 g/dL (ref 30.0–36.0)
MCHC: 34 g/dL (ref 30.0–36.0)
MCV: 87.9 fL (ref 78.0–100.0)
MCV: 87.8 fL (ref 78.0–100.0)
MCV: 87.8 fL (ref 78.0–100.0)
Platelets: 233 10*3/uL (ref 150–400)
Platelets: 185 10*3/uL (ref 150–400)
Platelets: 216 10*3/uL (ref 150–400)
RBC: 4.72 MIL/uL (ref 4.22–5.81)
RBC: 4.25 MIL/uL (ref 4.22–5.81)
RBC: 3.95 MIL/uL — ABNORMAL LOW (ref 4.22–5.81)
RDW: 13.5 % (ref 11.5–15.5)
RDW: 13.5 % (ref 11.5–15.5)
RDW: 13.5 % (ref 11.5–15.5)
WBC: 10.8 10*3/uL — ABNORMAL HIGH (ref 4.0–10.5)
WBC: 18.7 10*3/uL — AB (ref 4.0–10.5)
WBC: 15.5 10*3/uL — ABNORMAL HIGH (ref 4.0–10.5)

## 2017-05-12 LAB — POCT I-STAT 4, (NA,K, GLUC, HGB,HCT)
GLUCOSE: 174 mg/dL — AB (ref 65–99)
HCT: 36 % — ABNORMAL LOW (ref 39.0–52.0)
HEMOGLOBIN: 12.2 g/dL — AB (ref 13.0–17.0)
Potassium: 4.4 mmol/L (ref 3.5–5.1)
SODIUM: 142 mmol/L (ref 135–145)

## 2017-05-12 LAB — GLUCOSE, CAPILLARY
GLUCOSE-CAPILLARY: 188 mg/dL — AB (ref 65–99)
GLUCOSE-CAPILLARY: 164 mg/dL — AB (ref 65–99)
GLUCOSE-CAPILLARY: 152 mg/dL — AB (ref 65–99)
GLUCOSE-CAPILLARY: 147 mg/dL — AB (ref 65–99)
GLUCOSE-CAPILLARY: 155 mg/dL — AB (ref 65–99)
Glucose-Capillary: 115 mg/dL — ABNORMAL HIGH (ref 65–99)
Glucose-Capillary: 183 mg/dL — ABNORMAL HIGH (ref 65–99)
Glucose-Capillary: 169 mg/dL — ABNORMAL HIGH (ref 65–99)
Glucose-Capillary: 165 mg/dL — ABNORMAL HIGH (ref 65–99)
Glucose-Capillary: 171 mg/dL — ABNORMAL HIGH (ref 65–99)

## 2017-05-12 LAB — BLOOD GAS, ARTERIAL
Acid-Base Excess: 1.1 mmol/L (ref 0.0–2.0)
Bicarbonate: 25.1 mmol/L (ref 20.0–28.0)
Drawn by: 39898
FIO2: 21
O2 Saturation: 96.7 %
Patient temperature: 98.6
pCO2 arterial: 39.5 mmHg (ref 32.0–48.0)
pH, Arterial: 7.419 (ref 7.350–7.450)
pO2, Arterial: 92.2 mmHg (ref 83.0–108.0)

## 2017-05-12 LAB — PROTIME-INR
INR: 1.5
Prothrombin Time: 18.3 seconds — ABNORMAL HIGH (ref 11.4–15.2)

## 2017-05-12 LAB — CREATININE, SERUM
Creatinine, Ser: 0.81 mg/dL (ref 0.61–1.24)
GFR calc Af Amer: >60 mL/min (ref >60)
GFR calc non Af Amer: >60 mL/min (ref >60)

## 2017-05-12 LAB — HEMOGLOBIN AND HEMATOCRIT, BLOOD
HCT: 31.2 % — ABNORMAL LOW (ref 39.0–52.0)
Hemoglobin: 10.3 g/dL — ABNORMAL LOW (ref 13.0–17.0)

## 2017-05-12 LAB — APTT
APTT: 31 s (ref 24–36)
aPTT: 30 seconds (ref 24–36)

## 2017-05-12 LAB — MAGNESIUM: Magnesium: 2.8 mg/dL — ABNORMAL HIGH (ref 1.7–2.4)

## 2017-05-12 LAB — PLATELET COUNT: Platelets: 185 10*3/uL (ref 150–400)

## 2017-05-12 SURGERY — CORONARY ARTERY BYPASS GRAFTING (CABG)
Anesthesia: General | Site: Chest

## 2017-05-12 MED ORDER — VANCOMYCIN HCL IN DEXTROSE 1-5 GM/200ML-% IV SOLN
1000.0000 mg | Freq: Two times a day (BID) | INTRAVENOUS | Status: AC
  Administered 2017-05-12 – 2017-05-13 (×2): 1000 mg via INTRAVENOUS
  Filled 2017-05-12 (×2): qty 200

## 2017-05-12 MED ORDER — SUCCINYLCHOLINE CHLORIDE 20 MG/ML IJ SOLN
INTRAMUSCULAR | Status: DC | PRN
  Administered 2017-05-12: 140 mg via INTRAVENOUS

## 2017-05-12 MED ORDER — MORPHINE SULFATE (PF) 2 MG/ML IV SOLN: 1.0000 mg | INTRAVENOUS | Status: DC | PRN

## 2017-05-12 MED ORDER — METOPROLOL TARTRATE 5 MG/5ML IV SOLN: 2.5000 mg | INTRAVENOUS | Status: DC | PRN

## 2017-05-12 MED ORDER — SODIUM CHLORIDE 0.9 % IV SOLN: 250.0000 mL | INTRAVENOUS | Status: DC

## 2017-05-12 MED ORDER — LACTATED RINGERS IV SOLN
INTRAVENOUS | Status: DC | PRN
  Administered 2017-05-12 (×2): via INTRAVENOUS

## 2017-05-12 MED ORDER — PROPOFOL 10 MG/ML IV BOLUS
INTRAVENOUS | Status: DC | PRN
  Administered 2017-05-12: 100 mg via INTRAVENOUS

## 2017-05-12 MED ORDER — CHLORHEXIDINE GLUCONATE 0.12 % MT SOLN
15.0000 mL | OROMUCOSAL | Status: AC
  Administered 2017-05-12: 15 mL via OROMUCOSAL

## 2017-05-12 MED ORDER — HEMOSTATIC AGENTS (NO CHARGE) OPTIME
TOPICAL | Status: DC | PRN
  Administered 2017-05-12 (×2): 1 via TOPICAL

## 2017-05-12 MED ORDER — DOCUSATE SODIUM 100 MG PO CAPS
200.0000 mg | ORAL_CAPSULE | Freq: Every day | ORAL | Status: DC
  Administered 2017-05-13 – 2017-05-24 (×11): 200 mg via ORAL
  Filled 2017-05-12 (×12): qty 2

## 2017-05-12 MED ORDER — MORPHINE SULFATE (PF) 4 MG/ML IV SOLN
1.0000 mg | INTRAVENOUS | Status: AC | PRN
  Administered 2017-05-13 (×2): 4 mg via INTRAVENOUS
  Filled 2017-05-12 (×2): qty 1

## 2017-05-12 MED ORDER — LACTATED RINGERS IV SOLN
500.0000 mL | Freq: Once | INTRAVENOUS | Status: DC | PRN
Start: 2017-05-12 — End: 2017-05-12

## 2017-05-12 MED ORDER — INSULIN REGULAR BOLUS VIA INFUSION
0.0000 [IU] | Freq: Three times a day (TID) | INTRAVENOUS | Status: DC
  Filled 2017-05-12: qty 10

## 2017-05-12 MED ORDER — SODIUM CHLORIDE 0.9 % IJ SOLN
INTRAMUSCULAR | Status: DC | PRN
  Administered 2017-05-12 (×3): 4 mL via TOPICAL

## 2017-05-12 MED ORDER — LIDOCAINE 2% (20 MG/ML) 5 ML SYRINGE
INTRAMUSCULAR | Status: AC
  Filled 2017-05-12: qty 5

## 2017-05-12 MED ORDER — ACETAMINOPHEN 160 MG/5ML PO SOLN: 1000.0000 mg | Freq: Four times a day (QID) | ORAL | Status: AC

## 2017-05-12 MED ORDER — SODIUM CHLORIDE 0.9% FLUSH: 3.0000 mL | INTRAVENOUS | Status: DC | PRN

## 2017-05-12 MED ORDER — METOPROLOL TARTRATE 12.5 MG HALF TABLET
12.5000 mg | ORAL_TABLET | Freq: Two times a day (BID) | ORAL | Status: DC
  Administered 2017-05-13 – 2017-05-14 (×3): 12.5 mg via ORAL
  Filled 2017-05-12 (×4): qty 1

## 2017-05-12 MED ORDER — ACETAMINOPHEN 500 MG PO TABS
1000.0000 mg | ORAL_TABLET | Freq: Four times a day (QID) | ORAL | Status: AC
  Administered 2017-05-13 – 2017-05-17 (×18): 1000 mg via ORAL
  Filled 2017-05-12 (×19): qty 2

## 2017-05-12 MED ORDER — OXYCODONE HCL 5 MG PO TABS
5.0000 mg | ORAL_TABLET | ORAL | Status: DC | PRN
  Administered 2017-05-14 – 2017-05-24 (×13): 10 mg via ORAL
  Filled 2017-05-12 (×13): qty 2

## 2017-05-12 MED ORDER — ALBUMIN HUMAN 5 % IV SOLN
250.0000 mL | INTRAVENOUS | Status: AC | PRN
  Administered 2017-05-12 (×2): 250 mL via INTRAVENOUS

## 2017-05-12 MED ORDER — DOPAMINE-DEXTROSE 3.2-5 MG/ML-% IV SOLN: 0.0000 ug/kg/min | INTRAVENOUS | Status: DC

## 2017-05-12 MED ORDER — LACTATED RINGERS IV SOLN: INTRAVENOUS | Status: DC

## 2017-05-12 MED ORDER — ARTIFICIAL TEARS OPHTHALMIC OINT
TOPICAL_OINTMENT | OPHTHALMIC | Status: AC
  Filled 2017-05-12: qty 3.5

## 2017-05-12 MED ORDER — ALPRAZOLAM 0.25 MG PO TABS
0.2500 mg | ORAL_TABLET | Freq: Two times a day (BID) | ORAL | Status: DC | PRN
  Administered 2017-05-14 – 2017-05-22 (×3): 0.25 mg via ORAL
  Filled 2017-05-12 (×3): qty 1

## 2017-05-12 MED ORDER — TRANEXAMIC ACID 1000 MG/10ML IV SOLN
1000.0000 mg | INTRAVENOUS | Status: DC
  Filled 2017-05-12: qty 10

## 2017-05-12 MED ORDER — METOCLOPRAMIDE HCL 5 MG/ML IJ SOLN
10.0000 mg | Freq: Four times a day (QID) | INTRAMUSCULAR | Status: AC
  Administered 2017-05-13 – 2017-05-14 (×7): 10 mg via INTRAVENOUS
  Filled 2017-05-12 (×9): qty 2

## 2017-05-12 MED ORDER — FENTANYL CITRATE (PF) 250 MCG/5ML IJ SOLN
INTRAMUSCULAR | Status: DC | PRN
  Administered 2017-05-12: 150 ug via INTRAVENOUS
  Administered 2017-05-12: 100 ug via INTRAVENOUS
  Administered 2017-05-12: 50 ug via INTRAVENOUS
  Administered 2017-05-12 (×2): 100 ug via INTRAVENOUS
  Administered 2017-05-12: 50 ug via INTRAVENOUS
  Administered 2017-05-12: 400 ug via INTRAVENOUS
  Administered 2017-05-12: 50 ug via INTRAVENOUS
  Administered 2017-05-12: 100 ug via INTRAVENOUS
  Administered 2017-05-12: 50 ug via INTRAVENOUS
  Administered 2017-05-12 (×2): 100 ug via INTRAVENOUS
  Administered 2017-05-12: 50 ug via INTRAVENOUS
  Administered 2017-05-12: 100 ug via INTRAVENOUS
  Administered 2017-05-12: 50 ug via INTRAVENOUS
  Administered 2017-05-12: 100 ug via INTRAVENOUS
  Administered 2017-05-12 (×2): 50 ug via INTRAVENOUS
  Administered 2017-05-12: 250 ug via INTRAVENOUS
  Administered 2017-05-12 (×2): 50 ug via INTRAVENOUS

## 2017-05-12 MED ORDER — ORAL CARE MOUTH RINSE
15.0000 mL | Freq: Two times a day (BID) | OROMUCOSAL | Status: DC
  Administered 2017-05-12 – 2017-05-13 (×2): 15 mL via OROMUCOSAL

## 2017-05-12 MED ORDER — ROCURONIUM BROMIDE 10 MG/ML (PF) SYRINGE
PREFILLED_SYRINGE | INTRAVENOUS | Status: AC
  Filled 2017-05-12: qty 5

## 2017-05-12 MED ORDER — CHLORHEXIDINE GLUCONATE 0.12% ORAL RINSE (MEDLINE KIT)
15.0000 mL | Freq: Two times a day (BID) | OROMUCOSAL | Status: DC
  Administered 2017-05-12 – 2017-05-13 (×2): 15 mL via OROMUCOSAL

## 2017-05-12 MED ORDER — SODIUM CHLORIDE 0.9 % IV SOLN
0.0000 ug/min | INTRAVENOUS | Status: DC
  Administered 2017-05-12: 50 ug/min via INTRAVENOUS
  Administered 2017-05-13: 60 ug/min via INTRAVENOUS
  Administered 2017-05-13: 70 ug/min via INTRAVENOUS
  Filled 2017-05-12 (×3): qty 2

## 2017-05-12 MED ORDER — PROPOFOL 10 MG/ML IV BOLUS
INTRAVENOUS | Status: AC
  Filled 2017-05-12: qty 20

## 2017-05-12 MED ORDER — 0.9 % SODIUM CHLORIDE (POUR BTL) OPTIME
TOPICAL | Status: DC | PRN
  Administered 2017-05-12: 6000 mL

## 2017-05-12 MED ORDER — DEXTROSE 5 % IV SOLN
1.5000 g | Freq: Two times a day (BID) | INTRAVENOUS | Status: AC
  Administered 2017-05-12 – 2017-05-14 (×4): 1.5 g via INTRAVENOUS
  Filled 2017-05-12 (×4): qty 1.5

## 2017-05-12 MED ORDER — FENTANYL CITRATE (PF) 250 MCG/5ML IJ SOLN
INTRAMUSCULAR | Status: AC
  Filled 2017-05-12: qty 5

## 2017-05-12 MED ORDER — ARTIFICIAL TEARS OPHTHALMIC OINT
TOPICAL_OINTMENT | OPHTHALMIC | Status: DC | PRN
  Administered 2017-05-12: 1 via OPHTHALMIC

## 2017-05-12 MED ORDER — ASPIRIN 81 MG PO CHEW: 324.0000 mg | CHEWABLE_TABLET | Freq: Every day | ORAL | Status: DC

## 2017-05-12 MED ORDER — MIDAZOLAM HCL 5 MG/5ML IJ SOLN
INTRAMUSCULAR | Status: DC | PRN
  Administered 2017-05-12: 2 mg via INTRAVENOUS
  Administered 2017-05-12: 3 mg via INTRAVENOUS
  Administered 2017-05-12: 5 mg via INTRAVENOUS

## 2017-05-12 MED ORDER — FENTANYL CITRATE (PF) 250 MCG/5ML IJ SOLN
INTRAMUSCULAR | Status: AC
  Filled 2017-05-12: qty 30

## 2017-05-12 MED ORDER — SODIUM CHLORIDE 0.9% FLUSH
3.0000 mL | Freq: Two times a day (BID) | INTRAVENOUS | Status: DC
  Administered 2017-05-14 – 2017-05-23 (×4): 3 mL via INTRAVENOUS

## 2017-05-12 MED ORDER — ORAL CARE MOUTH RINSE
15.0000 mL | Freq: Four times a day (QID) | OROMUCOSAL | Status: DC
  Administered 2017-05-13 (×2): 15 mL via OROMUCOSAL

## 2017-05-12 MED ORDER — VANCOMYCIN HCL IN DEXTROSE 1-5 GM/200ML-% IV SOLN
1000.0000 mg | Freq: Once | INTRAVENOUS | Status: DC
  Filled 2017-05-12: qty 200

## 2017-05-12 MED ORDER — MILRINONE LACTATE IN DEXTROSE 20-5 MG/100ML-% IV SOLN
0.1250 ug/kg/min | INTRAVENOUS | Status: AC
  Administered 2017-05-12: 0.25 ug/kg/min via INTRAVENOUS
  Filled 2017-05-12: qty 100

## 2017-05-12 MED ORDER — TRAMADOL HCL 50 MG PO TABS
50.0000 mg | ORAL_TABLET | ORAL | Status: DC | PRN
  Administered 2017-05-13 – 2017-05-24 (×12): 50 mg via ORAL
  Filled 2017-05-12 (×12): qty 1

## 2017-05-12 MED ORDER — POTASSIUM CHLORIDE 10 MEQ/50ML IV SOLN: 10.0000 meq | INTRAVENOUS | Status: AC

## 2017-05-12 MED ORDER — HEPARIN SODIUM (PORCINE) 1000 UNIT/ML IJ SOLN
INTRAMUSCULAR | Status: AC
  Filled 2017-05-12: qty 1

## 2017-05-12 MED ORDER — MORPHINE SULFATE (PF) 4 MG/ML IV SOLN
2.0000 mg | INTRAVENOUS | Status: DC | PRN
  Administered 2017-05-13 (×2): 4 mg via INTRAVENOUS
  Filled 2017-05-12 (×2): qty 1

## 2017-05-12 MED ORDER — SODIUM CHLORIDE 0.9 % IV SOLN: INTRAVENOUS | Status: DC

## 2017-05-12 MED ORDER — ACETAMINOPHEN 650 MG RE SUPP
650.0000 mg | Freq: Once | RECTAL | Status: AC
  Administered 2017-05-12: 650 mg via RECTAL

## 2017-05-12 MED ORDER — HEMOSTATIC AGENTS (NO CHARGE) OPTIME
TOPICAL | Status: DC | PRN
  Administered 2017-05-12: 1 via TOPICAL

## 2017-05-12 MED ORDER — PROTAMINE SULFATE 10 MG/ML IV SOLN
INTRAVENOUS | Status: AC
  Filled 2017-05-12: qty 25

## 2017-05-12 MED ORDER — PROTAMINE SULFATE 10 MG/ML IV SOLN
INTRAVENOUS | Status: AC
  Filled 2017-05-12: qty 5

## 2017-05-12 MED ORDER — HEPARIN SODIUM (PORCINE) 1000 UNIT/ML IJ SOLN
INTRAMUSCULAR | Status: DC | PRN
  Administered 2017-05-12 (×2): 2000 [IU] via INTRAVENOUS
  Administered 2017-05-12: 36000 [IU] via INTRAVENOUS

## 2017-05-12 MED ORDER — SODIUM CHLORIDE 0.45 % IV SOLN: INTRAVENOUS | Status: DC | PRN

## 2017-05-12 MED ORDER — METOPROLOL TARTRATE 25 MG/10 ML ORAL SUSPENSION: 12.5000 mg | Freq: Two times a day (BID) | ORAL | Status: DC

## 2017-05-12 MED ORDER — MAGNESIUM SULFATE 4 GM/100ML IV SOLN
4.0000 g | Freq: Once | INTRAVENOUS | Status: AC
  Administered 2017-05-12: 4 g via INTRAVENOUS
  Filled 2017-05-12: qty 100

## 2017-05-12 MED ORDER — ACETAMINOPHEN 160 MG/5ML PO SOLN: 650.0000 mg | Freq: Once | ORAL | Status: AC

## 2017-05-12 MED ORDER — BISACODYL 10 MG RE SUPP: 10.0000 mg | Freq: Every day | RECTAL | Status: DC

## 2017-05-12 MED ORDER — ROCURONIUM BROMIDE 10 MG/ML (PF) SYRINGE
PREFILLED_SYRINGE | INTRAVENOUS | Status: DC | PRN
Start: 2017-05-12 — End: 2017-05-12
  Administered 2017-05-12 (×2): 50 mg via INTRAVENOUS
  Administered 2017-05-12: 100 mg via INTRAVENOUS
  Administered 2017-05-12 (×2): 50 mg via INTRAVENOUS

## 2017-05-12 MED ORDER — NITROGLYCERIN IN D5W 200-5 MCG/ML-% IV SOLN: 0.0000 ug/min | INTRAVENOUS | Status: DC

## 2017-05-12 MED ORDER — SODIUM CHLORIDE 0.9 % IV SOLN
0.0000 ug/kg/h | INTRAVENOUS | Status: DC
  Administered 2017-05-12: 0.5 ug/kg/h via INTRAVENOUS
  Filled 2017-05-12: qty 2

## 2017-05-12 MED ORDER — MIDAZOLAM HCL 10 MG/2ML IJ SOLN
INTRAMUSCULAR | Status: AC
  Filled 2017-05-12: qty 2

## 2017-05-12 MED ORDER — FAMOTIDINE IN NACL 20-0.9 MG/50ML-% IV SOLN
20.0000 mg | Freq: Two times a day (BID) | INTRAVENOUS | Status: AC
  Administered 2017-05-12 (×2): 20 mg via INTRAVENOUS
  Filled 2017-05-12: qty 50

## 2017-05-12 MED ORDER — MILRINONE LACTATE IN DEXTROSE 20-5 MG/100ML-% IV SOLN
0.1250 ug/kg/min | INTRAVENOUS | Status: AC
  Administered 2017-05-12: 0.3 ug/kg/min via INTRAVENOUS
  Administered 2017-05-13: 0.125 ug/kg/min via INTRAVENOUS
  Administered 2017-05-13: 0.3 ug/kg/min via INTRAVENOUS
  Administered 2017-05-14: 0.125 ug/kg/min via INTRAVENOUS
  Filled 2017-05-12 (×4): qty 100

## 2017-05-12 MED ORDER — SODIUM CHLORIDE 0.9 % IV SOLN
INTRAVENOUS | Status: DC
  Administered 2017-05-13: 4.2 [IU]/h via INTRAVENOUS
  Filled 2017-05-12 (×2): qty 1

## 2017-05-12 MED ORDER — PANTOPRAZOLE SODIUM 40 MG PO TBEC
40.0000 mg | DELAYED_RELEASE_TABLET | Freq: Every day | ORAL | Status: DC
  Administered 2017-05-14 – 2017-05-24 (×11): 40 mg via ORAL
  Filled 2017-05-12 (×11): qty 1

## 2017-05-12 MED ORDER — ONDANSETRON HCL 4 MG/2ML IJ SOLN: 4.0000 mg | Freq: Four times a day (QID) | INTRAMUSCULAR | Status: DC | PRN

## 2017-05-12 MED ORDER — PROTAMINE SULFATE 10 MG/ML IV SOLN
INTRAVENOUS | Status: DC | PRN
  Administered 2017-05-12: 400 mg via INTRAVENOUS

## 2017-05-12 MED ORDER — BISACODYL 5 MG PO TBEC
10.0000 mg | DELAYED_RELEASE_TABLET | Freq: Every day | ORAL | Status: DC
  Administered 2017-05-13 – 2017-05-24 (×6): 10 mg via ORAL
  Filled 2017-05-12 (×8): qty 2

## 2017-05-12 MED ORDER — PHENYLEPHRINE HCL 10 MG/ML IJ SOLN
INTRAVENOUS | Status: DC | PRN
  Administered 2017-05-12: 20 ug/min via INTRAVENOUS

## 2017-05-12 MED ORDER — MORPHINE SULFATE (PF) 2 MG/ML IV SOLN: 2.0000 mg | INTRAVENOUS | Status: DC | PRN

## 2017-05-12 MED ORDER — ASPIRIN EC 325 MG PO TBEC
325.0000 mg | DELAYED_RELEASE_TABLET | Freq: Every day | ORAL | Status: DC
  Administered 2017-05-13 – 2017-05-24 (×12): 325 mg via ORAL
  Filled 2017-05-12 (×12): qty 1

## 2017-05-12 MED ORDER — ALBUMIN HUMAN 5 % IV SOLN
INTRAVENOUS | Status: DC | PRN
  Administered 2017-05-12 (×2): via INTRAVENOUS

## 2017-05-12 MED ORDER — LIDOCAINE 2% (20 MG/ML) 5 ML SYRINGE
INTRAMUSCULAR | Status: DC | PRN
  Administered 2017-05-12: 100 mg via INTRAVENOUS

## 2017-05-12 MED ORDER — MIDAZOLAM HCL 2 MG/2ML IJ SOLN: 2.0000 mg | INTRAMUSCULAR | Status: DC | PRN

## 2017-05-12 MED FILL — Sodium Bicarbonate IV Soln 8.4%: INTRAVENOUS | Qty: 50 | Status: AC

## 2017-05-12 MED FILL — Albumin, Human Inj 5%: INTRAVENOUS | Qty: 250 | Status: AC

## 2017-05-12 MED FILL — Mannitol IV Soln 20%: INTRAVENOUS | Qty: 500 | Status: AC

## 2017-05-12 MED FILL — Heparin Sodium (Porcine) Inj 1000 Unit/ML: INTRAMUSCULAR | Qty: 30 | Status: AC

## 2017-05-12 MED FILL — Sodium Chloride IV Soln 0.9%: INTRAVENOUS | Qty: 2000 | Status: AC

## 2017-05-12 MED FILL — Potassium Chloride Inj 2 mEq/ML: INTRAVENOUS | Qty: 5 | Status: AC

## 2017-05-12 MED FILL — Electrolyte-R (PH 7.4) Solution: INTRAVENOUS | Qty: 6000 | Status: AC

## 2017-05-12 MED FILL — Magnesium Sulfate Inj 50%: INTRAMUSCULAR | Qty: 10 | Status: AC

## 2017-05-12 MED FILL — Lidocaine HCl IV Inj 20 MG/ML: INTRAVENOUS | Qty: 5 | Status: AC

## 2017-05-12 MED FILL — Heparin Sodium (Porcine) Inj 1000 Unit/ML: INTRAMUSCULAR | Qty: 20 | Status: AC

## 2017-05-12 SURGICAL SUPPLY — 106 items
ADAPTER CARDIO PERF ANTE/RETRO (ADAPTER) ×4 IMPLANT
ADH SKN CLS APL DERMABOND .7 (GAUZE/BANDAGES/DRESSINGS) ×2
ADPR PRFSN 84XANTGRD RTRGD (ADAPTER) ×2
BAG DECANTER FOR FLEXI CONT (MISCELLANEOUS) ×4 IMPLANT
BANDAGE ACE 4X5 VEL STRL LF (GAUZE/BANDAGES/DRESSINGS) ×4 IMPLANT
BANDAGE ACE 6X5 VEL STRL LF (GAUZE/BANDAGES/DRESSINGS) ×4 IMPLANT
BASKET HEART  (ORDER IN 25'S) (MISCELLANEOUS) ×1
BASKET HEART (ORDER IN 25'S) (MISCELLANEOUS) ×1
BASKET HEART (ORDER IN 25S) (MISCELLANEOUS) ×2 IMPLANT
BLADE CLIPPER SURG (BLADE) ×2 IMPLANT
BLADE STERNUM SYSTEM 6 (BLADE) ×4 IMPLANT
BLADE SURG 11 STRL SS (BLADE) ×2 IMPLANT
BLADE SURG 12 STRL SS (BLADE) ×4 IMPLANT
BNDG GAUZE ELAST 4 BULKY (GAUZE/BANDAGES/DRESSINGS) ×4 IMPLANT
CANISTER SUCT 3000ML PPV (MISCELLANEOUS) ×4 IMPLANT
CANNULA ARTERIAL NVNT 3/8 24FR (CANNULA) ×2 IMPLANT
CANNULA GUNDRY RCSP 15FR (MISCELLANEOUS) ×4 IMPLANT
CATH CPB KIT VANTRIGT (MISCELLANEOUS) ×4 IMPLANT
CATH ROBINSON RED A/P 18FR (CATHETERS) ×14 IMPLANT
CATH THORACIC 36FR RT ANG (CATHETERS) ×4 IMPLANT
CLIP TI WIDE RED SMALL 24 (CLIP) ×6 IMPLANT
CRADLE DONUT ADULT HEAD (MISCELLANEOUS) ×4 IMPLANT
DERMABOND ADVANCED (GAUZE/BANDAGES/DRESSINGS) ×2
DERMABOND ADVANCED .7 DNX12 (GAUZE/BANDAGES/DRESSINGS) IMPLANT
DRAIN CHANNEL 32F RND 10.7 FF (WOUND CARE) ×4 IMPLANT
DRAPE CARDIOVASCULAR INCISE (DRAPES) ×4
DRAPE SLUSH/WARMER DISC (DRAPES) ×4 IMPLANT
DRAPE SRG 135X102X78XABS (DRAPES) ×2 IMPLANT
DRSG AQUACEL AG ADV 3.5X14 (GAUZE/BANDAGES/DRESSINGS) ×4 IMPLANT
ELECT BLADE 4.0 EZ CLEAN MEGAD (MISCELLANEOUS) ×4
ELECT BLADE 6.5 EXT (BLADE) ×4 IMPLANT
ELECT CAUTERY BLADE 6.4 (BLADE) ×4 IMPLANT
ELECT REM PT RETURN 9FT ADLT (ELECTROSURGICAL) ×8
ELECTRODE BLDE 4.0 EZ CLN MEGD (MISCELLANEOUS) ×2 IMPLANT
ELECTRODE REM PT RTRN 9FT ADLT (ELECTROSURGICAL) ×4 IMPLANT
FELT TEFLON 1X6 (MISCELLANEOUS) ×8 IMPLANT
GAUZE SPONGE 4X4 12PLY STRL (GAUZE/BANDAGES/DRESSINGS) ×8 IMPLANT
GAUZE SPONGE 4X4 12PLY STRL LF (GAUZE/BANDAGES/DRESSINGS) ×4 IMPLANT
GLOVE BIO SURGEON STRL SZ7 (GLOVE) ×2 IMPLANT
GLOVE BIO SURGEON STRL SZ7.5 (GLOVE) ×12 IMPLANT
GLOVE BIOGEL PI IND STRL 6 (GLOVE) IMPLANT
GLOVE BIOGEL PI IND STRL 7.0 (GLOVE) IMPLANT
GLOVE BIOGEL PI INDICATOR 6 (GLOVE) ×4
GLOVE BIOGEL PI INDICATOR 7.0 (GLOVE) ×4
GOWN STRL REUS W/ TWL LRG LVL3 (GOWN DISPOSABLE) ×8 IMPLANT
GOWN STRL REUS W/TWL LRG LVL3 (GOWN DISPOSABLE) ×40
HEMOSTAT POWDER SURGIFOAM 1G (HEMOSTASIS) ×12 IMPLANT
HEMOSTAT SURGICEL 2X14 (HEMOSTASIS) ×4 IMPLANT
INSERT FOGARTY XLG (MISCELLANEOUS) ×2 IMPLANT
KIT BASIN OR (CUSTOM PROCEDURE TRAY) ×4 IMPLANT
KIT ROOM TURNOVER OR (KITS) ×4 IMPLANT
KIT SUCTION CATH 14FR (SUCTIONS) ×4 IMPLANT
KIT VASOVIEW HEMOPRO VH 3000 (KITS) ×4 IMPLANT
LEAD PACING MYOCARDI (MISCELLANEOUS) ×4 IMPLANT
MARKER GRAFT CORONARY BYPASS (MISCELLANEOUS) ×12 IMPLANT
NS IRRIG 1000ML POUR BTL (IV SOLUTION) ×22 IMPLANT
PACK OPEN HEART (CUSTOM PROCEDURE TRAY) ×4 IMPLANT
PAD ARMBOARD 7.5X6 YLW CONV (MISCELLANEOUS) ×8 IMPLANT
PAD ELECT DEFIB RADIOL ZOLL (MISCELLANEOUS) ×4 IMPLANT
PENCIL BUTTON HOLSTER BLD 10FT (ELECTRODE) ×4 IMPLANT
POWDER SURGICEL 3.0 GRAM (HEMOSTASIS) ×2 IMPLANT
PUNCH AORTIC ROTATE 4.0MM (MISCELLANEOUS) ×2 IMPLANT
PUNCH AORTIC ROTATE 4.5MM 8IN (MISCELLANEOUS) IMPLANT
PUNCH AORTIC ROTATE 5MM 8IN (MISCELLANEOUS) IMPLANT
SET CARDIOPLEGIA MPS 5001102 (MISCELLANEOUS) ×2 IMPLANT
SPONGE LAP 18X18 X RAY DECT (DISPOSABLE) ×6 IMPLANT
SPONGE LAP 4X18 X RAY DECT (DISPOSABLE) ×2 IMPLANT
STAPLER VISISTAT 35W (STAPLE) ×2 IMPLANT
SURGIFLO W/THROMBIN 8M KIT (HEMOSTASIS) ×4 IMPLANT
SUT BONE WAX W31G (SUTURE) ×4 IMPLANT
SUT MNCRL AB 4-0 PS2 18 (SUTURE) ×4 IMPLANT
SUT PROLENE 3 0 SH DA (SUTURE) IMPLANT
SUT PROLENE 3 0 SH1 36 (SUTURE) IMPLANT
SUT PROLENE 4 0 RB 1 (SUTURE) ×4
SUT PROLENE 4 0 SH DA (SUTURE) ×4 IMPLANT
SUT PROLENE 4-0 RB1 .5 CRCL 36 (SUTURE) ×2 IMPLANT
SUT PROLENE 5 0 C 1 36 (SUTURE) IMPLANT
SUT PROLENE 6 0 C 1 30 (SUTURE) ×4 IMPLANT
SUT PROLENE 6 0 CC (SUTURE) ×12 IMPLANT
SUT PROLENE 8 0 BV175 6 (SUTURE) ×4 IMPLANT
SUT PROLENE BLUE 7 0 (SUTURE) ×12 IMPLANT
SUT SILK  1 MH (SUTURE)
SUT SILK 1 MH (SUTURE) IMPLANT
SUT SILK 2 0 SH CR/8 (SUTURE) ×2 IMPLANT
SUT SILK 3 0 SH CR/8 (SUTURE) IMPLANT
SUT STEEL 6MS V (SUTURE) ×4 IMPLANT
SUT STEEL STERNAL CCS#1 18IN (SUTURE) ×2 IMPLANT
SUT STEEL SZ 6 DBL 3X14 BALL (SUTURE) ×6 IMPLANT
SUT VIC AB 1 CTX 36 (SUTURE) ×12
SUT VIC AB 1 CTX36XBRD ANBCTR (SUTURE) ×4 IMPLANT
SUT VIC AB 2-0 CT1 27 (SUTURE) ×8
SUT VIC AB 2-0 CT1 TAPERPNT 27 (SUTURE) IMPLANT
SUT VIC AB 2-0 CTX 27 (SUTURE) IMPLANT
SUT VIC AB 3-0 X1 27 (SUTURE) IMPLANT
SUTURE E-PAK OPEN HEART (SUTURE) ×4 IMPLANT
SYSTEM SAHARA CHEST DRAIN ATS (WOUND CARE) ×4 IMPLANT
TAPE CLOTH SURG 4X10 WHT LF (GAUZE/BANDAGES/DRESSINGS) ×2 IMPLANT
TAPE PAPER 2X10 WHT MICROPORE (GAUZE/BANDAGES/DRESSINGS) ×2 IMPLANT
TOWEL GREEN STERILE (TOWEL DISPOSABLE) ×16 IMPLANT
TOWEL GREEN STERILE FF (TOWEL DISPOSABLE) ×8 IMPLANT
TOWEL OR 17X24 6PK STRL BLUE (TOWEL DISPOSABLE) ×8 IMPLANT
TOWEL OR 17X26 10 PK STRL BLUE (TOWEL DISPOSABLE) ×8 IMPLANT
TRAY FOLEY SILVER 16FR TEMP (SET/KITS/TRAYS/PACK) ×4 IMPLANT
TUBING INSUFFLATION (TUBING) ×4 IMPLANT
UNDERPAD 30X30 (UNDERPADS AND DIAPERS) ×4 IMPLANT
WATER STERILE IRR 1000ML POUR (IV SOLUTION) ×8 IMPLANT

## 2017-05-12 NOTE — Anesthesia Procedure Notes (Signed)
Central Venous Catheter Insertion Performed by: Marcene DuosFITZGERALD, Korion Cuevas, anesthesiologist Start/End5/22/2018 7:05 AM, 05/12/2017 7:15 AM Patient location: Pre-op. Preanesthetic checklist: patient identified, IV checked, site marked, risks and benefits discussed, surgical consent, monitors and equipment checked, pre-op evaluation, timeout performed and anesthesia consent Hand hygiene performed  and maximum sterile barriers used  PA cath was placed.Swan type:thermodilution Procedure performed using ultrasound guided technique. Ultrasound Notes:anatomy identified, needle tip was noted to be adjacent to the nerve/plexus identified, no ultrasound evidence of intravascular and/or intraneural injection and image(s) printed for medical record Attempts: 1 Patient tolerated the procedure well with no immediate complications.

## 2017-05-12 NOTE — Progress Notes (Signed)
The patient was examined and preop studies reviewed. There has been no change from the prior exam and the patient is ready for surgery.  Plan CABG on J Yeo

## 2017-05-12 NOTE — Brief Op Note (Signed)
05/08/2017 - 05/12/2017  12:19 PM  PATIENT:  [name] PalauJeffrey Hammond  56 y.o. male  PRE-OPERATIVE DIAGNOSIS:  CAD  POST-OPERATIVE DIAGNOSIS:  CAD  PROCEDURE: TRANSESOPHAGEAL ECHOCARDIOGRAM (TEE), MEDIAN STERNOTOMY for CORONARY ARTERY BYPASS GRAFTING (CABG) x 3 (LIMA to LAD, SVG to DIAGONAL, SVG to OM)  using left inernal mammary artery and right thigh greater saphenous vein harvested endoscopically   SURGEON:  Surgeon(s) and Role:    Kerin PernaVan Trigt, Peter, MD - Primary  PHYSICIAN ASSISTANT: Doree Fudgeonielle Fionna Merriott PA-C  ANESTHESIA:   general  EBL:  Total I/O In: 1000 [I.V.:1000] Out: 250 [Urine:250]  DRAINS: Chest tubes placed in the mediastinal and pleural spaces   COUNTS CORRECT:  YES  DICTATION: .Dragon Dictation  PLAN OF CARE: Admit to inpatient   PATIENT DISPOSITION:  ICU - intubated and hemodynamically stable.   Delay start of Pharmacological VTE agent (>24hrs) due to surgical blood loss or risk of bleeding: yes  BASELINE WEIGHT: 135 kg

## 2017-05-12 NOTE — Anesthesia Procedure Notes (Addendum)
Central Venous Catheter Insertion Performed by: Suzette Battiest, anesthesiologist Start/End5/22/2018 7:05 AM, 05/12/2017 7:15 AM Patient location: Pre-op. Preanesthetic checklist: patient identified, IV checked, site marked, risks and benefits discussed, surgical consent, monitors and equipment checked, pre-op evaluation, timeout performed and anesthesia consent Position: Trendelenburg Lidocaine 1% used for infiltration and patient sedated Hand hygiene performed , maximum sterile barriers used  and Seldinger technique used Catheter size: 8.5 Fr Total catheter length 10. Central line and PA cath was placed.MAC introducer Swan type:thermodilution PA Cath depth:45 Procedure performed using ultrasound guided technique. Ultrasound Notes:anatomy identified, needle tip was noted to be adjacent to the nerve/plexus identified, no ultrasound evidence of intravascular and/or intraneural injection and image(s) printed for medical record Attempts: 1 Following insertion, line sutured and dressing applied. Post procedure assessment: blood return through all ports, free fluid flow and no air  Patient tolerated the procedure well with no immediate complications.

## 2017-05-12 NOTE — Anesthesia Procedure Notes (Signed)
Procedure Name: Intubation Date/Time: 05/12/2017 7:53 AM Performed by: Rosiland OzMEYERS, Genie Wenke Pre-anesthesia Checklist: Patient identified, Emergency Drugs available and Suction available Patient Re-evaluated:Patient Re-evaluated prior to inductionOxygen Delivery Method: Circle system utilized Preoxygenation: Pre-oxygenation with 100% oxygen Intubation Type: IV induction Ventilation: Mask ventilation without difficulty and Oral airway inserted - appropriate to patient size Laryngoscope Size: Miller and 3 Grade View: Grade II Tube size: 8.0 mm Number of attempts: 1 Airway Equipment and Method: Stylet Placement Confirmation: ETT inserted through vocal cords under direct vision,  positive ETCO2 and breath sounds checked- equal and bilateral Secured at: 24 cm Tube secured with: Tape Dental Injury: Teeth and Oropharynx as per pre-operative assessment

## 2017-05-12 NOTE — Procedures (Signed)
Extubation Procedure Note  Patient Details:   Name: [name] PalauJeffrey Isenhower DOB: 10/02/1961 MRN: [id number]   Airway Documentation:     Evaluation  O2 sats: stable throughout Complications: No apparent complications Patient did tolerate procedure well. Bilateral Breath Sounds: Diminished, Clear   Yes   Patient extubated to [facility] without any complications. Patient has an adequate cough and is able to speak clearly.  Sharene SkeansSilva, Jaretssi Kraker C 05/12/2017, 8:55 PM

## 2017-05-12 NOTE — Progress Notes (Signed)
To O/R in stable condition with RN/Monitor, wife followed. Report called to Anesthesia RN prior to transport.

## 2017-05-12 NOTE — Transfer of Care (Signed)
Immediate Anesthesia Transfer of Care Note  Patient: [name] PalauJeffrey Bickley  Procedure(s) Performed: Procedure(s): CORONARY ARTERY BYPASS GRAFTING (CABG) x three , using left inernal mammary artery and left leg     greater saphenous vein harvested endoscopically (N/A) TRANSESOPHAGEAL ECHOCARDIOGRAM (TEE) (N/A)  Patient Location: SICU  Anesthesia Type:General  Level of Consciousness: patient cooperative, obtunded and Patient remains intubated per anesthesia plan  Airway & Oxygen Therapy: Patient remains intubated per anesthesia plan and Patient placed on Ventilator (see vital sign flow sheet for setting)  Post-op Assessment: Report given to RN and Post -op Vital signs reviewed and stable  Post vital signs: Reviewed and stable  Last Vitals:  Vitals:   05/12/17 0456 05/12/17 0500  BP: 139/76 139/76  Pulse: 64   Resp:    Temp:      Last Pain:  Vitals:   05/12/17 0500  TempSrc:   PainSc: 0-No pain      Patients Stated Pain Goal: 0 (05/12/17 0500)  Complications: No apparent anesthesia complications

## 2017-05-12 NOTE — Anesthesia Postprocedure Evaluation (Signed)
Anesthesia Post Note  Patient: [name] Hammond  Procedure(s) Performed: Procedure(s) (LRB): CORONARY ARTERY BYPASS GRAFTING (CABG) x three , using left inernal mammary artery and left leg     greater saphenous vein harvested endoscopically (N/A) TRANSESOPHAGEAL ECHOCARDIOGRAM (TEE) (N/A)  Patient location during evaluation: SICU Anesthesia Type: General Level of consciousness: sedated Pain management: pain level controlled Vital Signs Assessment: post-procedure vital signs reviewed and stable Respiratory status: patient remains intubated per anesthesia plan Cardiovascular status: stable Anesthetic complications: no       Last Vitals:  Vitals:   05/12/17 1645 05/12/17 1700  BP:  (!) 97/58  Pulse: 80 80  Resp: (!) 3 19  Temp: 36.5 C 36.6 C    Last Pain:  Vitals:   05/12/17 1500  TempSrc: Core (Comment)  PainSc:                  Kennieth RadFitzgerald, Nicholi Ghuman E

## 2017-05-12 NOTE — Progress Notes (Signed)
Updated report received in patient's room via Inetta Fermoina RN using SBAR format, reviewed new orders, VS, meds and events of the day, assumed care of patient.

## 2017-05-12 NOTE — Progress Notes (Signed)
NIF -20, VC .8L  

## 2017-05-12 NOTE — Progress Notes (Signed)
Patient ID: Marlan PalauJeffrey Hammond, male   DOB: 04/18/1961, 56 y.o.   MRN: [id number]   SICU Evening Rounds:   Hemodynamically stable  CI = 2.2 on milrinone 0.3, dop 3  Still on vent, starting to wean.   Urine output good  CT output low  CBC    Component Value Date/Time   WBC 18.7 (H) 05/12/2017 1430   RBC 4.25 05/12/2017 1430   HGB 12.3 (L) 05/12/2017 1430   HCT 37.3 (L) 05/12/2017 1430   PLT 185 05/12/2017 1430   MCV 87.8 05/12/2017 1430   MCH 28.9 05/12/2017 1430   MCHC 33.0 05/12/2017 1430   RDW 13.5 05/12/2017 1430   LYMPHSABS 2,162 05/04/2017 1603   MONOABS 564 05/04/2017 1603   EOSABS 188 05/04/2017 1603   BASOSABS 0 05/04/2017 1603     BMET    Component Value Date/Time   NA 142 05/12/2017 1425   K 4.4 05/12/2017 1425   CL 99 (L) 05/12/2017 1255   CO2 27 05/12/2017 0351   GLUCOSE 174 (H) 05/12/2017 1425   BUN 12 05/12/2017 1255   CREATININE 0.60 (L) 05/12/2017 1255   CREATININE 0.91 05/04/2017 1603   CALCIUM 8.6 (L) 05/12/2017 0351   GFRNONAA >60 05/12/2017 0351   GFRAA >60 05/12/2017 0351     A/P:  Stable postop course. Continue current plans

## 2017-05-12 NOTE — OR Nursing (Signed)
12:45 - 1st call to SICU charge nurse 13:35 - 2nd call to SICU charge nurse

## 2017-05-12 NOTE — Progress Notes (Signed)
  Echocardiogram Echocardiogram Transesophageal has been performed.  Leta JunglingCooper, [place] M 05/12/2017, 1:10 PM

## 2017-05-13 ENCOUNTER — Inpatient Hospital Stay (HOSPITAL_COMMUNITY): Payer: BLUE CROSS/BLUE SHIELD

## 2017-05-13 ENCOUNTER — Encounter (HOSPITAL_COMMUNITY): Payer: Self-pay | Admitting: Cardiothoracic Surgery

## 2017-05-13 DIAGNOSIS — Z951 Presence of aortocoronary bypass graft: Secondary | ICD-10-CM

## 2017-05-13 LAB — GLUCOSE, CAPILLARY
GLUCOSE-CAPILLARY: 102 mg/dL — AB (ref 65–99)
GLUCOSE-CAPILLARY: 108 mg/dL — AB (ref 65–99)
GLUCOSE-CAPILLARY: 119 mg/dL — AB (ref 65–99)
GLUCOSE-CAPILLARY: 128 mg/dL — AB (ref 65–99)
GLUCOSE-CAPILLARY: 134 mg/dL — AB (ref 65–99)
GLUCOSE-CAPILLARY: 152 mg/dL — AB (ref 65–99)
GLUCOSE-CAPILLARY: 93 mg/dL (ref 65–99)
Glucose-Capillary: 112 mg/dL — ABNORMAL HIGH (ref 65–99)
Glucose-Capillary: 100 mg/dL — ABNORMAL HIGH (ref 65–99)
Glucose-Capillary: 96 mg/dL (ref 65–99)
Glucose-Capillary: 124 mg/dL — ABNORMAL HIGH (ref 65–99)
Glucose-Capillary: 105 mg/dL — ABNORMAL HIGH (ref 65–99)
Glucose-Capillary: 154 mg/dL — ABNORMAL HIGH (ref 65–99)
Glucose-Capillary: 184 mg/dL — ABNORMAL HIGH (ref 65–99)
Glucose-Capillary: 131 mg/dL — ABNORMAL HIGH (ref 65–99)

## 2017-05-13 LAB — CBC
HCT: 29.8 % — ABNORMAL LOW (ref 39.0–52.0)
HEMATOCRIT: 32 % — AB (ref 39.0–52.0)
HEMOGLOBIN: 10.6 g/dL — AB (ref 13.0–17.0)
HEMOGLOBIN: 9.9 g/dL — AB (ref 13.0–17.0)
MCH: 28.7 pg (ref 26.0–34.0)
MCH: 29.2 pg (ref 26.0–34.0)
MCHC: 33.1 g/dL (ref 30.0–36.0)
MCHC: 33.2 g/dL (ref 30.0–36.0)
MCV: 86.7 fL (ref 78.0–100.0)
MCV: 87.9 fL (ref 78.0–100.0)
PLATELETS: 169 10*3/uL (ref 150–400)
Platelets: 193 10*3/uL (ref 150–400)
RBC: 3.69 MIL/uL — AB (ref 4.22–5.81)
RBC: 3.39 MIL/uL — AB (ref 4.22–5.81)
RDW: 13.4 % (ref 11.5–15.5)
RDW: 13.8 % (ref 11.5–15.5)
WBC: 14.1 10*3/uL — AB (ref 4.0–10.5)
WBC: 11.7 10*3/uL — AB (ref 4.0–10.5)

## 2017-05-13 LAB — BLOOD GAS, ARTERIAL
ACID-BASE DEFICIT: 1.2 mmol/L (ref 0.0–2.0)
Bicarbonate: 23.7 mmol/L (ref 20.0–28.0)
DRAWN BY: 419771
O2 Content: 4 L/min
O2 SAT: 93.6 %
PCO2 ART: 44.4 mmHg (ref 32.0–48.0)
PH ART: 7.347 — AB (ref 7.350–7.450)
Patient temperature: 98.6
pO2, Arterial: 73.4 mmHg — ABNORMAL LOW (ref 83.0–108.0)

## 2017-05-13 LAB — BASIC METABOLIC PANEL
ANION GAP: 4 — AB (ref 5–15)
BUN: 10 mg/dL (ref 6–20)
CHLORIDE: 109 mmol/L (ref 101–111)
CO2: 25 mmol/L (ref 22–32)
Calcium: 7.3 mg/dL — ABNORMAL LOW (ref 8.9–10.3)
Creatinine, Ser: 0.72 mg/dL (ref 0.61–1.24)
GFR calc Af Amer: >60 mL/min (ref >60)
GFR calc non Af Amer: >60 mL/min (ref >60)
GLUCOSE: 97 mg/dL (ref 65–99)
POTASSIUM: 4.4 mmol/L (ref 3.5–5.1)
Sodium: 138 mmol/L (ref 135–145)

## 2017-05-13 LAB — POCT I-STAT, CHEM 8
BUN: 13 mg/dL (ref 6–20)
CHLORIDE: 100 mmol/L — AB (ref 101–111)
CREATININE: 0.8 mg/dL (ref 0.61–1.24)
Calcium, Ion: 1.07 mmol/L — ABNORMAL LOW (ref 1.15–1.40)
Glucose, Bld: 137 mg/dL — ABNORMAL HIGH (ref 65–99)
HEMATOCRIT: 29 % — AB (ref 39.0–52.0)
Hemoglobin: 9.9 g/dL — ABNORMAL LOW (ref 13.0–17.0)
Potassium: 4.1 mmol/L (ref 3.5–5.1)
Sodium: 138 mmol/L (ref 135–145)
TCO2: 25 mmol/L (ref 0–100)

## 2017-05-13 LAB — CREATININE, SERUM
CREATININE: 0.81 mg/dL (ref 0.61–1.24)
GFR calc non Af Amer: >60 mL/min (ref >60)

## 2017-05-13 LAB — MAGNESIUM
Magnesium: 2.3 mg/dL (ref 1.7–2.4)
Magnesium: 2.3 mg/dL (ref 1.7–2.4)

## 2017-05-13 MED ORDER — INSULIN DETEMIR 100 UNIT/ML ~~LOC~~ SOLN
22.0000 [IU] | Freq: Two times a day (BID) | SUBCUTANEOUS | Status: DC
  Administered 2017-05-13 – 2017-05-14 (×4): 22 [IU] via SUBCUTANEOUS
  Filled 2017-05-13 (×5): qty 0.22

## 2017-05-13 MED ORDER — FUROSEMIDE 10 MG/ML IJ SOLN
20.0000 mg | Freq: Two times a day (BID) | INTRAMUSCULAR | Status: AC
  Administered 2017-05-13 – 2017-05-14 (×4): 20 mg via INTRAVENOUS
  Filled 2017-05-13 (×4): qty 2

## 2017-05-13 MED ORDER — INSULIN ASPART 100 UNIT/ML ~~LOC~~ SOLN
0.0000 [IU] | SUBCUTANEOUS | Status: DC
  Administered 2017-05-13: 4 [IU] via SUBCUTANEOUS
  Administered 2017-05-13 – 2017-05-14 (×4): 2 [IU] via SUBCUTANEOUS

## 2017-05-13 NOTE — Progress Notes (Signed)
DAILY PROGRESS NOTE   Patient Name: [name] Hammond Date of Encounter: 05/13/2017  Hospital Problem List   Active Problems:   Unstable angina [facility])   Coronary artery disease    Chief Complaint   "A little pressure in the chest, but better than I thought it would be"  Subjective   Doing well POD #1. On milrinone and dopamine as well as insulin gtts. PA mean of 42. MAP of 80. Maintaining sinus at 76. Good urine output. Some chest tube output. Volume overloaded, but mobilizing - 2L negative yesterday.  Objective   Vitals:   05/13/17 0400 05/13/17 0500 05/13/17 0600 05/13/17 0700  BP: (!) 97/54  106/72 (!) 105/55  Pulse: 76 84 76 78  Resp: 19 (!) 30 17 15   Temp: 98.8 F (37.1 C) 98.6 F (37 C) 98.6 F (37 C) 98.4 F (36.9 C)  TempSrc:      SpO2: 92% 97% 98% 95%  Weight:  (!) 315 lb 9.6 oz (143.2 kg)    Height:        Intake/Output Summary (Last 24 hours) at 05/13/17 0756 Last data filed at 05/13/17 0500  Gross per 24 hour  Intake          3785.13 ml  Output             5865 ml  Net         -2079.87 ml   Filed Weights   05/11/17 0531 05/12/17 0425 05/13/17 0500  Weight: (!) 301 lb 9.6 oz (136.8 kg) 298 lb 9.6 oz (135.4 kg) (!) 315 lb 9.6 oz (143.2 kg)    Physical Exam   General appearance: alert and no distress Neck: no carotid bruit, no JVD and Swan Ganz catheter in place Lungs: diminished breath sounds bilaterally Heart: regular rate and rhythm Abdomen: protuberant, distended, non-tender Extremities: edema 2+ edema Pulses: 2+ and symmetric Skin: Skin color, texture, turgor normal. No rashes or lesions Neurologic: Grossly normal Psych: Pleasant  Inpatient Medications    Scheduled Meds: . acetaminophen  1,000 mg Oral Q6H   Or  . acetaminophen (TYLENOL) oral liquid 160 mg/5 mL  1,000 mg Per Tube Q6H  . aspirin EC  325 mg Oral Daily   Or  . aspirin  324 mg Per Tube Daily  . atorvastatin  80 mg Oral QHS  . bisacodyl  10 mg Oral Daily   Or  .  bisacodyl  10 mg Rectal Daily  . chlorhexidine gluconate (MEDLINE KIT)  15 mL Mouth Rinse BID  . docusate sodium  200 mg Oral Daily  . furosemide  20 mg Intravenous BID  . insulin aspart  0-24 Units Subcutaneous Q4H  . insulin detemir  22 Units Subcutaneous BID  . levothyroxine  100 mcg Oral QAC breakfast  . mouth rinse  15 mL Mouth Rinse BID  . mouth rinse  15 mL Mouth Rinse QID  . metoCLOPramide (REGLAN) injection  10 mg Intravenous Q6H  . metoprolol tartrate  12.5 mg Oral BID   Or  . metoprolol tartrate  12.5 mg Per Tube BID  . mupirocin ointment  1 application Nasal BID  . [START ON 05/14/2017] pantoprazole  40 mg Oral Daily  . sertraline  50 mg Oral QPM  . sodium chloride flush  3 mL Intravenous Q12H    Continuous Infusions: . sodium chloride    . sodium chloride    . sodium chloride    . albumin human    . cefUROXime (ZINACEF)  IV Stopped (05/13/17 0519)  . insulin (NOVOLIN-R) infusion 3.6 Units/hr (05/13/17 6962)  . lactated ringers 20 mL/hr at 05/13/17 0500  . lactated ringers    . milrinone 0.3 mcg/kg/min (05/13/17 0500)  . nitroGLYCERIN Stopped (05/12/17 1415)  . phenylephrine (NEO-SYNEPHRINE) Adult infusion 60 mcg/min (05/13/17 0715)  . vancomycin Stopped (05/12/17 2246)    PRN Meds: sodium chloride, albumin human, ALPRAZolam, metoprolol tartrate, midazolam, morphine injection, ondansetron (ZOFRAN) IV, oxyCODONE, sodium chloride flush, traMADol   Labs   Results for orders placed or performed during the hospital encounter of 05/08/17 (from the past 48 hour(s))  Platelet inhibition p2y12 (not at [facility])     Status: Abnormal   Collection Time: 05/11/17  8:18 AM  Result Value Ref Range   Platelet Function  P2Y12 159 (L) 194 - 418 PRU    Comment:        The literature has shown a direct correlation of PRU values over 230 with higher risks of thrombotic events.  Lower PRU values are associated with platelet inhibition.   Basic metabolic panel     Status: Abnormal    Collection Time: 05/11/17  8:18 AM  Result Value Ref Range   Sodium 135 135 - 145 mmol/L   Potassium 4.2 3.5 - 5.1 mmol/L   Chloride 104 101 - 111 mmol/L   CO2 22 22 - 32 mmol/L   Glucose, Bld 119 (H) 65 - 99 mg/dL   BUN 10 6 - 20 mg/dL   Creatinine, Ser 0.72 0.61 - 1.24 mg/dL   Calcium 8.8 (L) 8.9 - 10.3 mg/dL   GFR calc non Af Amer >60 >60 mL/min   GFR calc Af Amer >60 >60 mL/min    Comment: (NOTE) The eGFR has been calculated using the CKD EPI equation. This calculation has not been validated in all clinical situations. eGFR's persistently <60 mL/min signify possible Chronic Kidney Disease.    Anion gap 9 5 - 15  Glucose, capillary     Status: Abnormal   Collection Time: 05/11/17 11:18 AM  Result Value Ref Range   Glucose-Capillary 166 (H) 65 - 99 mg/dL  Glucose, capillary     Status: Abnormal   Collection Time: 05/11/17  4:48 PM  Result Value Ref Range   Glucose-Capillary 101 (H) 65 - 99 mg/dL  Prepare RBC (crossmatch)     Status: None   Collection Time: 05/11/17  6:18 PM  Result Value Ref Range   Order Confirmation ORDER PROCESSED BY BLOOD BANK   Type and screen [place]     Status: None (Preliminary result)   Collection Time: 05/11/17  6:28 PM  Result Value Ref Range   ABO/RH(D) O POS    Antibody Screen NEG    Sample Expiration 05/14/2017    Unit Number [id number]    Blood Component Type RED CELLS,LR    Unit division 00    Status of Unit ALLOCATED    Transfusion Status OK TO TRANSFUSE    Crossmatch Result Compatible    Unit Number [id number]    Blood Component Type RBC LR PHER2    Unit division 00    Status of Unit ALLOCATED    Transfusion Status OK TO TRANSFUSE    Crossmatch Result Compatible   ABO/Rh     Status: None   Collection Time: 05/11/17  6:28 PM  Result Value Ref Range   ABO/RH(D) O POS   Glucose, capillary     Status: Abnormal   Collection Time: 05/11/17  9:16  PM  Result Value Ref Range   Glucose-Capillary 128 (H)  65 - 99 mg/dL  CBC     Status: Abnormal   Collection Time: 05/12/17  3:51 AM  Result Value Ref Range   WBC 10.8 (H) 4.0 - 10.5 K/uL   RBC 4.72 4.22 - 5.81 MIL/uL   Hemoglobin 13.9 13.0 - 17.0 g/dL   HCT 41.5 39.0 - 52.0 %   MCV 87.9 78.0 - 100.0 fL   MCH 29.4 26.0 - 34.0 pg   MCHC 33.5 30.0 - 36.0 g/dL   RDW 13.5 11.5 - 15.5 %   Platelets 233 150 - 400 K/uL  Basic metabolic panel     Status: Abnormal   Collection Time: 05/12/17  3:51 AM  Result Value Ref Range   Sodium 138 135 - 145 mmol/L   Potassium 3.9 3.5 - 5.1 mmol/L   Chloride 104 101 - 111 mmol/L   CO2 27 22 - 32 mmol/L   Glucose, Bld 112 (H) 65 - 99 mg/dL   BUN 13 6 - 20 mg/dL   Creatinine, Ser 0.84 0.61 - 1.24 mg/dL   Calcium 8.6 (L) 8.9 - 10.3 mg/dL   GFR calc non Af Amer >60 >60 mL/min   GFR calc Af Amer >60 >60 mL/min    Comment: (NOTE) The eGFR has been calculated using the CKD EPI equation. This calculation has not been validated in all clinical situations. eGFR's persistently <60 mL/min signify possible Chronic Kidney Disease.    Anion gap 7 5 - 15  APTT     Status: None   Collection Time: 05/12/17  3:51 AM  Result Value Ref Range   aPTT 30 24 - 36 seconds  Blood gas, arterial     Status: None   Collection Time: 05/12/17  4:22 AM  Result Value Ref Range   FIO2 21.00    pH, Arterial 7.419 7.350 - 7.450   pCO2 arterial 39.5 32.0 - 48.0 mmHg   pO2, Arterial 92.2 83.0 - 108.0 mmHg   Bicarbonate 25.1 20.0 - 28.0 mmol/L   Acid-Base Excess 1.1 0.0 - 2.0 mmol/L   O2 Saturation 96.7 %   Patient temperature 98.6    Collection site RIGHT RADIAL    Drawn by [phone]    Sample type ARTERIAL DRAW    Allens test (pass/fail) PASS PASS  Glucose, capillary     Status: Abnormal   Collection Time: 05/12/17  4:59 AM  Result Value Ref Range   Glucose-Capillary 115 (H) 65 - 99 mg/dL  I-STAT, chem 8     Status: Abnormal   Collection Time: 05/12/17  8:18 AM  Result Value Ref Range   Sodium 138 135 - 145 mmol/L    Potassium 4.6 3.5 - 5.1 mmol/L   Chloride 103 101 - 111 mmol/L   BUN 13 6 - 20 mg/dL   Creatinine, Ser 0.70 0.61 - 1.24 mg/dL   Glucose, Bld 132 (H) 65 - 99 mg/dL   Calcium, Ion 1.21 1.15 - 1.40 mmol/L   TCO2 28 0 - 100 mmol/L   Hemoglobin 13.9 13.0 - 17.0 g/dL   HCT 41.0 39.0 - 52.0 %  I-STAT, chem 8     Status: Abnormal   Collection Time: 05/12/17  9:57 AM  Result Value Ref Range   Sodium 140 135 - 145 mmol/L   Potassium 3.8 3.5 - 5.1 mmol/L   Chloride 107 101 - 111 mmol/L   BUN 11 6 - 20 mg/dL   Creatinine, Ser  0.50 (L) 0.61 - 1.24 mg/dL   Glucose, Bld 96 65 - 99 mg/dL   Calcium, Ion 1.13 (L) 1.15 - 1.40 mmol/L   TCO2 29 0 - 100 mmol/L   Hemoglobin 12.9 (L) 13.0 - 17.0 g/dL   HCT 38.0 (L) 39.0 - 52.0 %  I-STAT, chem 8     Status: Abnormal   Collection Time: 05/12/17 10:37 AM  Result Value Ref Range   Sodium 139 135 - 145 mmol/L   Potassium 4.1 3.5 - 5.1 mmol/L   Chloride 105 101 - 111 mmol/L   BUN 14 6 - 20 mg/dL   Creatinine, Ser 0.60 (L) 0.61 - 1.24 mg/dL   Glucose, Bld 105 (H) 65 - 99 mg/dL   Calcium, Ion 1.15 1.15 - 1.40 mmol/L   TCO2 30 0 - 100 mmol/L   Hemoglobin 13.6 13.0 - 17.0 g/dL   HCT 40.0 39.0 - 52.0 %  I-STAT, chem 8     Status: Abnormal   Collection Time: 05/12/17 10:48 AM  Result Value Ref Range   Sodium 140 135 - 145 mmol/L   Potassium 3.5 3.5 - 5.1 mmol/L   Chloride 98 (L) 101 - 111 mmol/L   BUN 11 6 - 20 mg/dL   Creatinine, Ser 0.60 (L) 0.61 - 1.24 mg/dL   Glucose, Bld 94 65 - 99 mg/dL   Calcium, Ion 1.05 (L) 1.15 - 1.40 mmol/L   TCO2 30 0 - 100 mmol/L   Hemoglobin 10.9 (L) 13.0 - 17.0 g/dL   HCT 32.0 (L) 39.0 - 52.0 %  I-STAT 3, arterial blood gas (G3+)     Status: Abnormal   Collection Time: 05/12/17 10:54 AM  Result Value Ref Range   pH, Arterial 7.439 7.350 - 7.450   pCO2 arterial 41.1 32.0 - 48.0 mmHg   pO2, Arterial 290.0 (H) 83.0 - 108.0 mmHg   Bicarbonate 27.9 20.0 - 28.0 mmol/L   TCO2 29 0 - 100 mmol/L   O2 Saturation 100.0 %    Acid-Base Excess 3.0 (H) 0.0 - 2.0 mmol/L   Patient temperature HIDE    Sample type ARTERIAL   Hemoglobin and hematocrit, blood     Status: Abnormal   Collection Time: 05/12/17 11:50 AM  Result Value Ref Range   Hemoglobin 10.3 (L) 13.0 - 17.0 g/dL    Comment: REPEATED TO VERIFY RESULT CALLED TO, READ BACK BY AND VERIFIED WITH: TICE,M RN @ 1204 05/12/17 LEONARD,A    HCT 31.2 (L) 39.0 - 52.0 %    Comment: REPEATED TO VERIFY RESULT CALLED TO, READ BACK BY AND VERIFIED WITH: TICE,M RN @ 0932 05/12/17 LEONARD,A   Platelet count     Status: None   Collection Time: 05/12/17 11:50 AM  Result Value Ref Range   Platelets 185 150 - 400 K/uL    Comment: REPEATED TO VERIFY RESULT CALLED TO, READ BACK BY AND VERIFIED WITH: TICE,M RN @ 3557 05/12/17 LEONARD,A   I-STAT, chem 8     Status: Abnormal   Collection Time: 05/12/17 11:51 AM  Result Value Ref Range   Sodium 139 135 - 145 mmol/L   Potassium 3.9 3.5 - 5.1 mmol/L   Chloride 99 (L) 101 - 111 mmol/L   BUN 11 6 - 20 mg/dL   Creatinine, Ser 0.70 0.61 - 1.24 mg/dL   Glucose, Bld 104 (H) 65 - 99 mg/dL   Calcium, Ion 1.01 (L) 1.15 - 1.40 mmol/L   TCO2 29 0 - 100 mmol/L   Hemoglobin 10.2 (  L) 13.0 - 17.0 g/dL   HCT 30.0 (L) 39.0 - 52.0 %  I-STAT, chem 8     Status: Abnormal   Collection Time: 05/12/17 12:55 PM  Result Value Ref Range   Sodium 140 135 - 145 mmol/L   Potassium 4.1 3.5 - 5.1 mmol/L   Chloride 99 (L) 101 - 111 mmol/L   BUN 12 6 - 20 mg/dL   Creatinine, Ser 0.60 (L) 0.61 - 1.24 mg/dL   Glucose, Bld 150 (H) 65 - 99 mg/dL   Calcium, Ion 0.99 (L) 1.15 - 1.40 mmol/L   TCO2 27 0 - 100 mmol/L   Hemoglobin 9.5 (L) 13.0 - 17.0 g/dL   HCT 28.0 (L) 39.0 - 52.0 %  I-STAT 3, arterial blood gas (G3+)     Status: Abnormal   Collection Time: 05/12/17 12:59 PM  Result Value Ref Range   pH, Arterial 7.390 7.350 - 7.450   pCO2 arterial 43.4 32.0 - 48.0 mmHg   pO2, Arterial 252.0 (H) 83.0 - 108.0 mmHg   Bicarbonate 26.3 20.0 - 28.0 mmol/L    TCO2 28 0 - 100 mmol/L   O2 Saturation 100.0 %   Acid-Base Excess 1.0 0.0 - 2.0 mmol/L   Patient temperature HIDE    Sample type ARTERIAL   Glucose, capillary     Status: Abnormal   Collection Time: 05/12/17  2:24 PM  Result Value Ref Range   Glucose-Capillary 183 (H) 65 - 99 mg/dL  I-STAT 4, (NA,K, GLUC, HGB,HCT)     Status: Abnormal   Collection Time: 05/12/17  2:25 PM  Result Value Ref Range   Sodium 142 135 - 145 mmol/L   Potassium 4.4 3.5 - 5.1 mmol/L   Glucose, Bld 174 (H) 65 - 99 mg/dL   HCT 36.0 (L) 39.0 - 52.0 %   Hemoglobin 12.2 (L) 13.0 - 17.0 g/dL  CBC     Status: Abnormal   Collection Time: 05/12/17  2:30 PM  Result Value Ref Range   WBC 18.7 (H) 4.0 - 10.5 K/uL   RBC 4.25 4.22 - 5.81 MIL/uL   Hemoglobin 12.3 (L) 13.0 - 17.0 g/dL    Comment: REPEATED TO VERIFY POST TRANSFUSION SPECIMEN    HCT 37.3 (L) 39.0 - 52.0 %   MCV 87.8 78.0 - 100.0 fL   MCH 28.9 26.0 - 34.0 pg   MCHC 33.0 30.0 - 36.0 g/dL   RDW 13.5 11.5 - 15.5 %   Platelets 185 150 - 400 K/uL  Protime-INR     Status: Abnormal   Collection Time: 05/12/17  2:30 PM  Result Value Ref Range   Prothrombin Time 18.3 (H) 11.4 - 15.2 seconds   INR 1.50   APTT     Status: None   Collection Time: 05/12/17  2:30 PM  Result Value Ref Range   aPTT 31 24 - 36 seconds  I-STAT 3, arterial blood gas (G3+)     Status: Abnormal   Collection Time: 05/12/17  3:01 PM  Result Value Ref Range   pH, Arterial 7.354 7.350 - 7.450   pCO2 arterial 44.1 32.0 - 48.0 mmHg   pO2, Arterial 112.0 (H) 83.0 - 108.0 mmHg   Bicarbonate 24.7 20.0 - 28.0 mmol/L   TCO2 26 0 - 100 mmol/L   O2 Saturation 98.0 %   Acid-base deficit 1.0 0.0 - 2.0 mmol/L   Patient temperature 36.5 C    Collection site ARTERIAL LINE    Drawn by Mining engineer    Sample  type ARTERIAL   Glucose, capillary     Status: Abnormal   Collection Time: 05/12/17  4:01 PM  Result Value Ref Range   Glucose-Capillary 169 (H) 65 - 99 mg/dL  Glucose, capillary      Status: Abnormal   Collection Time: 05/12/17  5:05 PM  Result Value Ref Range   Glucose-Capillary 188 (H) 65 - 99 mg/dL  Glucose, capillary     Status: Abnormal   Collection Time: 05/12/17  6:04 PM  Result Value Ref Range   Glucose-Capillary 165 (H) 65 - 99 mg/dL  I-STAT 3, arterial blood gas (G3+)     Status: Abnormal   Collection Time: 05/12/17  6:32 PM  Result Value Ref Range   pH, Arterial 7.306 (L) 7.350 - 7.450   pCO2 arterial 46.2 32.0 - 48.0 mmHg   pO2, Arterial 104.0 83.0 - 108.0 mmHg   Bicarbonate 23.1 20.0 - 28.0 mmol/L   TCO2 25 0 - 100 mmol/L   O2 Saturation 97.0 %   Acid-base deficit 3.0 (H) 0.0 - 2.0 mmol/L   Patient temperature 36.7 C    Collection site ARTERIAL LINE    Drawn by Operator    Sample type ARTERIAL   Glucose, capillary     Status: Abnormal   Collection Time: 05/12/17  6:48 PM  Result Value Ref Range   Glucose-Capillary 171 (H) 65 - 99 mg/dL  Glucose, capillary     Status: Abnormal   Collection Time: 05/12/17  7:50 PM  Result Value Ref Range   Glucose-Capillary 164 (H) 65 - 99 mg/dL  Glucose, capillary     Status: Abnormal   Collection Time: 05/12/17  8:42 PM  Result Value Ref Range   Glucose-Capillary 152 (H) 65 - 99 mg/dL  CBC     Status: Abnormal   Collection Time: 05/12/17  8:43 PM  Result Value Ref Range   WBC 15.5 (H) 4.0 - 10.5 K/uL   RBC 3.95 (L) 4.22 - 5.81 MIL/uL   Hemoglobin 11.8 (L) 13.0 - 17.0 g/dL   HCT 34.7 (L) 39.0 - 52.0 %   MCV 87.8 78.0 - 100.0 fL   MCH 29.9 26.0 - 34.0 pg   MCHC 34.0 30.0 - 36.0 g/dL   RDW 13.5 11.5 - 15.5 %   Platelets 216 150 - 400 K/uL  Magnesium     Status: Abnormal   Collection Time: 05/12/17  8:43 PM  Result Value Ref Range   Magnesium 2.8 (H) 1.7 - 2.4 mg/dL  Creatinine, serum     Status: None   Collection Time: 05/12/17  8:43 PM  Result Value Ref Range   Creatinine, Ser 0.81 0.61 - 1.24 mg/dL   GFR calc non Af Amer >60 >60 mL/min   GFR calc Af Amer >60 >60 mL/min    Comment: (NOTE) The  eGFR has been calculated using the CKD EPI equation. This calculation has not been validated in all clinical situations. eGFR's persistently <60 mL/min signify possible Chronic Kidney Disease.   I-STAT 3, arterial blood gas (G3+)     Status: Abnormal   Collection Time: 05/12/17  8:46 PM  Result Value Ref Range   pH, Arterial 7.310 (L) 7.350 - 7.450   pCO2 arterial 47.7 32.0 - 48.0 mmHg   pO2, Arterial 128.0 (H) 83.0 - 108.0 mmHg   Bicarbonate 24.0 20.0 - 28.0 mmol/L   TCO2 25 0 - 100 mmol/L   O2 Saturation 99.0 %   Acid-base deficit 2.0 0.0 - 2.0 mmol/L  Patient temperature 37.0 C    Collection site RADIAL, ALLEN'S TEST ACCEPTABLE    Sample type ARTERIAL   I-STAT, chem 8     Status: Abnormal   Collection Time: 05/12/17  9:00 PM  Result Value Ref Range   Sodium 142 135 - 145 mmol/L   Potassium 4.4 3.5 - 5.1 mmol/L   Chloride 105 101 - 111 mmol/L   BUN 12 6 - 20 mg/dL   Creatinine, Ser 0.70 0.61 - 1.24 mg/dL   Glucose, Bld 148 (H) 65 - 99 mg/dL   Calcium, Ion 1.09 (L) 1.15 - 1.40 mmol/L   TCO2 25 0 - 100 mmol/L   Hemoglobin 11.6 (L) 13.0 - 17.0 g/dL   HCT 34.0 (L) 39.0 - 52.0 %  Glucose, capillary     Status: Abnormal   Collection Time: 05/12/17 10:03 PM  Result Value Ref Range   Glucose-Capillary 147 (H) 65 - 99 mg/dL  I-STAT 3, arterial blood gas (G3+)     Status: Abnormal   Collection Time: 05/12/17 10:05 PM  Result Value Ref Range   pH, Arterial 7.319 (L) 7.350 - 7.450   pCO2 arterial 44.5 32.0 - 48.0 mmHg   pO2, Arterial 85.0 83.0 - 108.0 mmHg   Bicarbonate 22.8 20.0 - 28.0 mmol/L   TCO2 24 0 - 100 mmol/L   O2 Saturation 95.0 %   Acid-base deficit 3.0 (H) 0.0 - 2.0 mmol/L   Patient temperature 37.0 C    Collection site RADIAL, ALLEN'S TEST ACCEPTABLE    Sample type ARTERIAL   Glucose, capillary     Status: Abnormal   Collection Time: 05/12/17 10:47 PM  Result Value Ref Range   Glucose-Capillary 155 (H) 65 - 99 mg/dL  Glucose, capillary     Status: Abnormal    Collection Time: 05/12/17 11:54 PM  Result Value Ref Range   Glucose-Capillary 128 (H) 65 - 99 mg/dL  Glucose, capillary     Status: Abnormal   Collection Time: 05/13/17 12:59 AM  Result Value Ref Range   Glucose-Capillary 112 (H) 65 - 99 mg/dL  Glucose, capillary     Status: Abnormal   Collection Time: 05/13/17  1:58 AM  Result Value Ref Range   Glucose-Capillary 100 (H) 65 - 99 mg/dL  Glucose, capillary     Status: Abnormal   Collection Time: 05/13/17  3:00 AM  Result Value Ref Range   Glucose-Capillary 102 (H) 65 - 99 mg/dL  CBC     Status: Abnormal   Collection Time: 05/13/17  3:12 AM  Result Value Ref Range   WBC 14.1 (H) 4.0 - 10.5 K/uL   RBC 3.69 (L) 4.22 - 5.81 MIL/uL   Hemoglobin 10.6 (L) 13.0 - 17.0 g/dL   HCT 32.0 (L) 39.0 - 52.0 %   MCV 86.7 78.0 - 100.0 fL   MCH 28.7 26.0 - 34.0 pg   MCHC 33.1 30.0 - 36.0 g/dL   RDW 13.4 11.5 - 15.5 %   Platelets 193 150 - 400 K/uL  Basic metabolic panel     Status: Abnormal   Collection Time: 05/13/17  3:12 AM  Result Value Ref Range   Sodium 138 135 - 145 mmol/L   Potassium 4.4 3.5 - 5.1 mmol/L   Chloride 109 101 - 111 mmol/L   CO2 25 22 - 32 mmol/L   Glucose, Bld 97 65 - 99 mg/dL   BUN 10 6 - 20 mg/dL   Creatinine, Ser 0.72 0.61 - 1.24 mg/dL   Calcium  7.3 (L) 8.9 - 10.3 mg/dL   GFR calc non Af Amer >60 >60 mL/min   GFR calc Af Amer >60 >60 mL/min    Comment: (NOTE) The eGFR has been calculated using the CKD EPI equation. This calculation has not been validated in all clinical situations. eGFR's persistently <60 mL/min signify possible Chronic Kidney Disease.    Anion gap 4 (L) 5 - 15  Magnesium     Status: None   Collection Time: 05/13/17  3:12 AM  Result Value Ref Range   Magnesium 2.3 1.7 - 2.4 mg/dL  Blood gas, arterial     Status: Abnormal   Collection Time: 05/13/17  3:15 AM  Result Value Ref Range   O2 Content 4.0 L/min   Delivery systems NASAL CANNULA    pH, Arterial 7.347 (L) 7.350 - 7.450   pCO2  arterial 44.4 32.0 - 48.0 mmHg   pO2, Arterial 73.4 (L) 83.0 - 108.0 mmHg   Bicarbonate 23.7 20.0 - 28.0 mmol/L   Acid-base deficit 1.2 0.0 - 2.0 mmol/L   O2 Saturation 93.6 %   Patient temperature 98.6    Collection site A-LINE    Drawn by [phone]    Sample type ARTERIAL DRAW   Glucose, capillary     Status: None   Collection Time: 05/13/17  4:07 AM  Result Value Ref Range   Glucose-Capillary 96 65 - 99 mg/dL  Glucose, capillary     Status: Abnormal   Collection Time: 05/13/17  5:16 AM  Result Value Ref Range   Glucose-Capillary 124 (H) 65 - 99 mg/dL  Glucose, capillary     Status: Abnormal   Collection Time: 05/13/17  5:56 AM  Result Value Ref Range   Glucose-Capillary 108 (H) 65 - 99 mg/dL    ECG   NSR at 76, early repolarization - Personally Reviewed  Telemetry   NSR - Personally Reviewed  Radiology    Dg Chest Port 1 View  Result Date: 05/12/2017 CLINICAL DATA:  Broke post CABG EXAM: PORTABLE CHEST 1 VIEW COMPARISON:  Portable exam 1447 hours compared to 05/09/2017 FINDINGS: Tip of endotracheal tube projects 5.7 cm above carina. Nasogastric tube extends into stomach. RIGHT jugular Swan-Ganz catheter with tip projecting over RIGHT pulmonary artery at hilum. Mediastinal drain and LEFT thoracostomy tube present. Enlargement of cardiac silhouette and prominence mediastinum compatible with surgery. Atelectasis at LEFT lower lobe. No definite infiltrate, pleural effusion, or pneumothorax. IMPRESSION: Postoperative tubes and lines as above. Enlargement of cardiac silhouette with atelectasis in LEFT lower lobe. Electronically Signed   By: Lavonia Dana M.D.   On: 05/12/2017 15:06    Cardiac Studies   none  Assessment   1. Active Problems: 2.   Unstable angina ([place]) 3.   Coronary artery disease 4. s/p CABG x 3  Plan   1. Mr. Halley is doing well post-op. Vitals stable - plan to wean pressors. Diuresing. On insuling gtts - BG's <100. Seems to be doing well post-op.   Time  Spent Directly with Patient:  15 minutes  Length of Stay:  LOS: 5 days   Pixie Casino, MD, [place]  Attending Cardiologist  Direct Dial: [phone]  Fax: [phone]  Website:  [email or web address] 05/13/2017, 7:56 AM

## 2017-05-13 NOTE — Op Note (Signed)
NAME:  Uttech, Berish                    ACCOUNT NO.:  MEDICAL RECORD NO.:  [id number]  LOCATION:                                 FACILITY:  PHYSICIAN:  Kerin Perna, M.D.       DATE OF BIRTH:  DATE OF PROCEDURE:  05/12/2017 DATE OF DISCHARGE:                              OPERATIVE REPORT   OPERATIONS: 1. Coronary artery bypass grafting x3 (left internal mammary artery to     left anterior descending artery, saphenous vein graft to diagonal,     saphenous vein graft to circumflex marginal). 2. Endoscopic harvest of right leg greater saphenous vein. 3. Exposure, but not harvest of left leg greater saphenous vein.  SURGEON:  Kerin Perna, M.D.  ASSISTANT:  Doree Fudge, PA-C.  ANESTHESIA:  General by Dr. Casilda Carls.  PREOPERATIVE DIAGNOSIS:  Unstable angina.  PREOPERATIVE DIAGNOSES:  Unstable angina, non-ST-elevation myocardial infarction, severe left main and multivessel coronary artery disease.  CLINICAL NOTE:  The patient is an obese 300 pounds, diabetic, who presented with a non-ST-elevation MI.  He was found by cardiac catheterization to have critical left main stenosis.  LV systolic function was preserved.  He was felt to be a candidate for surgical coronary revascularization.  Prior to surgery, I examined the patient in his hospital room and reviewed the results of cardiac catheterization as well as echocardiogram with the patient and the family.  I discussed the indications and expected benefits of coronary artery bypass grafting for treatment of his coronary artery disease.  I discussed the major details of surgery including the use of general anesthesia and cardiopulmonary bypass, the location of the surgical incisions, and expected postoperative hospital recovery.  I discussed the risks of the surgery to the patient including risk of stroke, bleeding, blood transfusion requirement, MI, postoperative pulmonary problems including pleural effusion,  infection, and death.  After reviewing these issues, he demonstrated his understanding and agreed to proceed with surgery under what I felt was an informed consent.  OPERATIVE FINDINGS: 1. Obese body habitus made exposure of the heart extremely difficult,     the ascending aorta was shortened due to the fat and the RV outflow     tract.  Redo surgery would be at high risk. 2. Suboptimal, but adequate saphenous vein quality for two grafts. 3. Good-quality mammary artery to the LAD. 4. Preserved systolic normal LV function after separation from     cardiopulmonary bypass.  DESCRIPTION OF PROCEDURE:  The patient was brought to the OR and placed supine on the operating table where general anesthesia was induced under invasive hemodynamic monitoring.  The chest, abdomen and legs were prepped with Betadine and draped as a sterile field.  A sternal incision was made as the saphenous vein was harvested endoscopically in the right leg.  The vein was also exposed, but not harvested in the left leg due to small size.  The left internal mammary artery was harvested and it was a 1.5-mm vessel with excellent flow.  The sternal retractor was placed using the deep blades.  The pericardium was opened and suspended. Pursestrings were placed in the ascending aorta and right atrium  and once the vein was harvested, the patient was heparinized.  The patient was cannulated and placed on bypass.  The coronaries were identified for grafting.  The mammary artery and vein grafts were prepared for the distal anastomosis.  Cardioplegia cannulas were placed both antegrade and retrograde cold blood cardioplegia.  The patient was cooled to 32 degrees.  The aortic crossclamp was applied.  One liter of cold blood cardioplegia was delivered in the split doses between the antegrade aortic and retrograde coronary sinus catheters.  There was good cardioplegic arrest and septal temperature dropped less than 15  degrees. Cardioplegia was delivered every 20 minutes.  The distal coronary anastomoses were performed.  The first distal anastomosis was to the OM branch of the left circumflex.  This was a 1.5- mm vessel.  It had diffuse disease.  Proximally at the left main, there was a 95% stenosis.  A reverse saphenous vein was sewn end-to-side with running 7-0 Prolene with good flow through the graft.  Cardioplegia was redosed.  The second distal anastomosis was to the diagonal branch of the LAD. This had a 90% stenosis at its ostium.  A reverse saphenous vein was smaller caliber, was sewn end-to-side with running 7-0 Prolene.  There was good flow through the graft.  Cardioplegia was redosed.  The third distal anastomosis was to the LAD, which had a proximal 90% left main stenosis.  The left IMA pedicle was brought through an opening and the left lateral pericardium was brought down onto the LAD and sewn end-to-side with running 8-0 Prolene.  There was good flow through the anastomosis after briefly releasing the pedicle bulldog on the mammary artery.  The bulldog was reapplied and the pedicle was secured to the epicardium.  Cardioplegia was redosed.  While the crossclamp was still in place, two proximal vein anastomoses were performed on the ascending aorta using a 4.5-mm punch and running 6- 0 Prolene.  Prior to tying down the final proximal anastomosis, air was vented from the coronaries with a dose of retrograde warm blood cardioplegia.  The crossclamp was removed.  The patient was rewarmed and reperfused.  The vein grafts were de-aired and opened, each had good flow.  Hemostasis was documented at the proximal and distal sites.  The cardioplegia cannulas were removed. Temporary pacing wires were applied.  The lungs were expanded, ventilator was resumed.  The patient was weaned off cardiopulmonary bypass without difficulty on low-dose milrinone.  Cardiac index was 2.4. Echo showed normal LV  systolic function.  Protamine was administered without adverse reaction.  The mediastinum was irrigated.  The superior pericardial fat was closed. There was adequate hemostasis.  Anterior mediastinal and left pleural chest tubes were placed and brought out through separate incisions.  The sternum was closed with interrupted steel wire.  The pectoralis fascia was closed with a running #1 Vicryl.  This patient remained stable.  The subcutaneous and skin layers were closed using running Vicryl and sterile dressings were applied.  Total cardiopulmonary bypass time was 120 minutes.  The patient returned to the ICU in stable condition.     Kerin PernaPeter Van Trigt, M.D.     PV/MEDQ  D:  05/12/2017  T:  05/13/2017  Job:  [id number]  cc:   Verne Carrowhristopher McAlhany, MD

## 2017-05-13 NOTE — Progress Notes (Signed)
1 Day Post-Op Procedure(s) (LRB): CORONARY ARTERY BYPASS GRAFTING (CABG) x three , using left inernal mammary artery and left leg     greater saphenous vein harvested endoscopically (N/A) TRANSESOPHAGEAL ECHOCARDIOGRAM (TEE) (N/A) Subjective: CABG 3 for left main stenosis, non-ST elevation MI Morbid obesity, obstructive sleep apnea on CPAP Patient extubated neuro intact Palmar he status adequate We'll proceed with diuresis, and debridement of lines and mobilization out of bed Transition from IV insulin to twice a day Levemir and sliding scale Maintaining sinus rhythm on low-dose dopamine and milrinone to be weaned  Objective: Vital signs in last 24 hours: Temp:  [97.5 F (36.4 C)-99 F (37.2 C)] 98.2 F (36.8 C) (05/23 0900) Pulse Rate:  [70-90] 77 (05/23 0900) Cardiac Rhythm: Normal sinus rhythm (05/23 0800) Resp:  [0-33] 19 (05/23 0900) BP: (88-112)/(50-81) 96/62 (05/23 0900) SpO2:  [92 %-100 %] 96 % (05/23 0900) Arterial Line BP: (89-145)/(48-100) 136/53 (05/23 0900) FiO2 (%):  [40 %-50 %] 40 % (05/22 1958) Weight:  [315 lb 9.6 oz (143.2 kg)] 315 lb 9.6 oz (143.2 kg) (05/23 0500)  Hemodynamic parameters for last 24 hours: PAP: (27-46)/(5-21) 31/12 CO:  [4.3 L/min-6.2 L/min] 6 L/min CI:  [1.7 L/min/m2-2.4 L/min/m2] 2.3 L/min/m2  Intake/Output from previous day: 05/22 0701 - 05/23 0700 In: 3785.1 [I.V.:2395.1; Blood:760; IV Piggyback:630] Out: 5865 [Urine:3970; Blood:1475; Chest Tube:420] Intake/Output this shift: Total I/O In: 330.4 [I.V.:130.4; IV Piggyback:200] Out: 235 [Urine:225; Chest Tube:10]       Exam    General- alert and comfortable   Lungs- clear without rales, wheezes   Cor- regular rate and rhythm, no murmur , gallop   Abdomen- soft, non-tender   Extremities - warm, non-tender, minimal edema   Neuro- oriented, appropriate, no focal weakness   Lab Results:  Recent Labs  05/12/17 2043 05/12/17 2100 05/13/17 0312  WBC 15.5*  --  14.1*  HGB  11.8* 11.6* 10.6*  HCT 34.7* 34.0* 32.0*  PLT 216  --  193   BMET:  Recent Labs  05/12/17 0351  05/12/17 2100 05/13/17 0312  NA 138  < > 142 138  K 3.9  < > 4.4 4.4  CL 104  < > 105 109  CO2 27  --   --  25  GLUCOSE 112*  < > 148* 97  BUN 13  < > 12 10  CREATININE 0.84  < > 0.70 0.72  CALCIUM 8.6*  --   --  7.3*  < > = values in this interval not displayed.  PT/INR:  Recent Labs  05/12/17 1430  LABPROT 18.3*  INR 1.50   ABG    Component Value Date/Time   PHART 7.347 (L) 05/13/2017 0315   HCO3 23.7 05/13/2017 0315   TCO2 24 05/12/2017 2205   ACIDBASEDEF 1.2 05/13/2017 0315   O2SAT 93.6 05/13/2017 0315   CBG (last 3)   Recent Labs  05/13/17 0556 05/13/17 0711 05/13/17 0809  GLUCAP 108* 105* 119*    Assessment/Plan: S/P Procedure(s) (LRB): CORONARY ARTERY BYPASS GRAFTING (CABG) x three , using left inernal mammary artery and left leg     greater saphenous vein harvested endoscopically (N/A) TRANSESOPHAGEAL ECHOCARDIOGRAM (TEE) (N/A) Mobilize Diuresis Diabetes control See progression orders   LOS: 5 days    Kathlee Nationseter Van Trigt III 05/13/2017

## 2017-05-13 NOTE — Care Management Note (Signed)
Case Management Note Original Note Created Gala LewandowskyGraves-Bigelow, Brenda Kaye, RN 05/11/2017, 11:45 AM   Patient Details  Name: [name] PalauJeffrey Clippard MRN: [id number] Date of Birth: 05/31/1961  Subjective/Objective:  Pt presented for chest pain-post cardiac cath 05-08-17 revealed severe multivessel CAD. Plan for CABG Tuesday 05-12-17. Pt is from home with wife. PTA pt was working and independent.                  Action/Plan: CM will continue to monitor for disposition needs post procedure   Expected Discharge Date:                  Expected Discharge Plan:  Home w Home Health Services  In-House Referral:     Discharge planning Services  CM Consult  Post Acute Care Choice:    Choice offered to:     DME Arranged:    DME Agency:     HH Arranged:    HH Agency:     Status of Service:  In process, will continue to follow  If discussed at Long Length of Stay Meetings, dates discussed:  5/24  Discharge Disposition:    Additional Comments:  05/13/17- 1000- Donn PieriniKristi Ed Rayson RN, CM- pt s/p CABGx3- on 05/12/17- tx 2H post op - CM to continue to follow for d/c needs  Darrold SpanWebster, Caliya Narine Hall, RN 05/13/2017, 10:08 AM [phone]

## 2017-05-13 NOTE — Progress Notes (Signed)
CT surgery p.m. Rounds  Patient had excellent day Ambulated in hallway Normal sinus rhythm P.m. labs reviewed and are satisfactory Abdomen soft Extremities warm

## 2017-05-14 ENCOUNTER — Inpatient Hospital Stay (HOSPITAL_COMMUNITY): Payer: BLUE CROSS/BLUE SHIELD

## 2017-05-14 DIAGNOSIS — Z951 Presence of aortocoronary bypass graft: Secondary | ICD-10-CM

## 2017-05-14 LAB — GLUCOSE, CAPILLARY
GLUCOSE-CAPILLARY: 130 mg/dL — AB (ref 65–99)
GLUCOSE-CAPILLARY: 131 mg/dL — AB (ref 65–99)
Glucose-Capillary: 118 mg/dL — ABNORMAL HIGH (ref 65–99)
Glucose-Capillary: 124 mg/dL — ABNORMAL HIGH (ref 65–99)
Glucose-Capillary: 133 mg/dL — ABNORMAL HIGH (ref 65–99)
Glucose-Capillary: 140 mg/dL — ABNORMAL HIGH (ref 65–99)

## 2017-05-14 LAB — BASIC METABOLIC PANEL
Anion gap: 5 (ref 5–15)
BUN: 12 mg/dL (ref 6–20)
CO2: 26 mmol/L (ref 22–32)
Calcium: 7.3 mg/dL — ABNORMAL LOW (ref 8.9–10.3)
Chloride: 104 mmol/L (ref 101–111)
Creatinine, Ser: 0.83 mg/dL (ref 0.61–1.24)
GFR calc Af Amer: >60 mL/min (ref >60)
GFR calc non Af Amer: >60 mL/min (ref >60)
Glucose, Bld: 132 mg/dL — ABNORMAL HIGH (ref 65–99)
Potassium: 4.1 mmol/L (ref 3.5–5.1)
Sodium: 135 mmol/L (ref 135–145)

## 2017-05-14 LAB — CBC
HCT: 26.6 % — ABNORMAL LOW (ref 39.0–52.0)
Hemoglobin: 8.9 g/dL — ABNORMAL LOW (ref 13.0–17.0)
MCH: 29.5 pg (ref 26.0–34.0)
MCHC: 33.5 g/dL (ref 30.0–36.0)
MCV: 88.1 fL (ref 78.0–100.0)
Platelets: 144 10*3/uL — ABNORMAL LOW (ref 150–400)
RBC: 3.02 MIL/uL — ABNORMAL LOW (ref 4.22–5.81)
RDW: 13.9 % (ref 11.5–15.5)
WBC: 11.4 10*3/uL — ABNORMAL HIGH (ref 4.0–10.5)

## 2017-05-14 MED ORDER — SODIUM CHLORIDE 0.9% FLUSH
3.0000 mL | Freq: Two times a day (BID) | INTRAVENOUS | Status: DC
  Administered 2017-05-14 – 2017-05-23 (×15): 3 mL via INTRAVENOUS

## 2017-05-14 MED ORDER — MOVING RIGHT ALONG BOOK
Freq: Once | Status: AC
  Administered 2017-05-14: 09:00:00
  Filled 2017-05-14: qty 1

## 2017-05-14 MED ORDER — SODIUM CHLORIDE 0.9 % IV SOLN: 250.0000 mL | INTRAVENOUS | Status: DC | PRN

## 2017-05-14 MED ORDER — POTASSIUM CHLORIDE CRYS ER 20 MEQ PO TBCR
20.0000 meq | EXTENDED_RELEASE_TABLET | Freq: Two times a day (BID) | ORAL | Status: DC
  Administered 2017-05-15 – 2017-05-24 (×19): 20 meq via ORAL
  Filled 2017-05-14 (×19): qty 1

## 2017-05-14 MED ORDER — INSULIN ASPART 100 UNIT/ML ~~LOC~~ SOLN
0.0000 [IU] | Freq: Three times a day (TID) | SUBCUTANEOUS | Status: DC
  Administered 2017-05-14 – 2017-05-23 (×8): 2 [IU] via SUBCUTANEOUS

## 2017-05-14 MED ORDER — SODIUM CHLORIDE 0.9% FLUSH: 3.0000 mL | INTRAVENOUS | Status: DC | PRN

## 2017-05-14 MED ORDER — FUROSEMIDE 40 MG PO TABS
40.0000 mg | ORAL_TABLET | Freq: Every day | ORAL | Status: DC
  Administered 2017-05-15 – 2017-05-24 (×10): 40 mg via ORAL
  Filled 2017-05-14 (×10): qty 1

## 2017-05-14 MED ORDER — ORAL CARE MOUTH RINSE
15.0000 mL | Freq: Two times a day (BID) | OROMUCOSAL | Status: DC
  Administered 2017-05-14 – 2017-05-23 (×5): 15 mL via OROMUCOSAL

## 2017-05-14 NOTE — Progress Notes (Signed)
Patient arrived the unit on a wheelchair from 2H, assessment completed see flowsheet, placed on tele ccmd notified, patient oriented to room and staff, bed in lowest position , call bell within reach will continue to monitor.  

## 2017-05-14 NOTE — Progress Notes (Signed)
2 Days Post-Op Procedure(s) (LRB): CORONARY ARTERY BYPASS GRAFTING (CABG) x three , using left inernal mammary artery and left leg     greater saphenous vein harvested endoscopically (N/A) TRANSESOPHAGEAL ECHOCARDIOGRAM (TEE) (N/A) Subjective: Progressing well nsr off drips Objective: Vital signs in last 24 hours: Temp:  [98.2 F (36.8 C)-98.7 F (37.1 C)] 98.4 F (36.9 C) (05/24 0300) Pulse Rate:  [77-94] 84 (05/24 0700) Cardiac Rhythm: Normal sinus rhythm (05/24 0600) Resp:  [0-28] 14 (05/24 0700) BP: (92-130)/(54-83) 116/71 (05/24 0700) SpO2:  [92 %-100 %] 97 % (05/24 0700) Arterial Line BP: (103-160)/(53-72) 149/65 (05/23 1300) Weight:  [314 lb 1.6 oz (142.5 kg)] 314 lb 1.6 oz (142.5 kg) (05/24 0500)  Hemodynamic parameters for last 24 hours: PAP: (31)/(12) 31/12  Intake/Output from previous day: 05/23 0701 - 05/24 0700 In: 1599.8 [P.O.:540; I.V.:809.8; IV Piggyback:250] Out: 2735 [Urine:2375; Chest Tube:360] Intake/Output this shift: No intake/output data recorded.       Exam    General- alert and comfortable   Lungs- clear without rales, wheezes   Cor- regular rate and rhythm, no murmur , gallop   Abdomen- soft, non-tender   Extremities - warm, non-tender, minimal edema   Neuro- oriented, appropriate, no focal weakness   Lab Results:  Recent Labs  05/13/17 1700 05/13/17 1709 05/14/17 0405  WBC 11.7*  --  11.4*  HGB 9.9* 9.9* 8.9*  HCT 29.8* 29.0* 26.6*  PLT 169  --  144*   BMET:  Recent Labs  05/13/17 0312  05/13/17 1709 05/14/17 0405  NA 138  --  138 135  K 4.4  --  4.1 4.1  CL 109  --  100* 104  CO2 25  --   --  26  GLUCOSE 97  --  137* 132*  BUN 10  --  13 12  CREATININE 0.72  < > 0.80 0.83  CALCIUM 7.3*  --   --  7.3*  < > = values in this interval not displayed.  PT/INR:  Recent Labs  05/12/17 1430  LABPROT 18.3*  INR 1.50   ABG    Component Value Date/Time   PHART 7.347 (L) 05/13/2017 0315   HCO3 23.7 05/13/2017 0315   TCO2  25 05/13/2017 1709   ACIDBASEDEF 1.2 05/13/2017 0315   O2SAT 93.6 05/13/2017 0315   CBG (last 3)   Recent Labs  05/13/17 2018 05/14/17 0000 05/14/17 0423  GLUCAP 131* 124* 133*    Assessment/Plan: S/P Procedure(s) (LRB): CORONARY ARTERY BYPASS GRAFTING (CABG) x three , using left inernal mammary artery and left leg     greater saphenous vein harvested endoscopically (N/A) TRANSESOPHAGEAL ECHOCARDIOGRAM (TEE) (N/A) tx stepdown   LOS: 6 days    Kathlee Nationseter Van Trigt III 05/14/2017

## 2017-05-14 NOTE — Progress Notes (Addendum)
Patient ambulated 150 ft using a front wheel walker on room air,ambulation fairly tolerated will continue to monitor

## 2017-05-15 ENCOUNTER — Inpatient Hospital Stay (HOSPITAL_COMMUNITY): Payer: BLUE CROSS/BLUE SHIELD

## 2017-05-15 ENCOUNTER — Ambulatory Visit: Payer: BLUE CROSS/BLUE SHIELD | Admitting: Cardiovascular Disease

## 2017-05-15 DIAGNOSIS — I2510 Atherosclerotic heart disease of native coronary artery without angina pectoris: Secondary | ICD-10-CM

## 2017-05-15 LAB — TYPE AND SCREEN
ABO/RH(D): O POS
Antibody Screen: NEGATIVE
Unit division: 0
Unit division: 0

## 2017-05-15 LAB — BPAM RBC
Blood Product Expiration Date: 201806142359
Blood Product Expiration Date: 201806142359
Unit Type and Rh: 5100
Unit Type and Rh: 5100

## 2017-05-15 LAB — BASIC METABOLIC PANEL
Anion gap: 5 (ref 5–15)
BUN: 13 mg/dL (ref 6–20)
CO2: 28 mmol/L (ref 22–32)
Calcium: 7.5 mg/dL — ABNORMAL LOW (ref 8.9–10.3)
Chloride: 103 mmol/L (ref 101–111)
Creatinine, Ser: 0.85 mg/dL (ref 0.61–1.24)
GFR calc Af Amer: >60 mL/min (ref >60)
GFR calc non Af Amer: >60 mL/min (ref >60)
Glucose, Bld: 120 mg/dL — ABNORMAL HIGH (ref 65–99)
Potassium: 3.8 mmol/L (ref 3.5–5.1)
Sodium: 136 mmol/L (ref 135–145)

## 2017-05-15 LAB — CBC
HCT: 26.2 % — ABNORMAL LOW (ref 39.0–52.0)
Hemoglobin: 8.5 g/dL — ABNORMAL LOW (ref 13.0–17.0)
MCH: 28.8 pg (ref 26.0–34.0)
MCHC: 32.4 g/dL (ref 30.0–36.0)
MCV: 88.8 fL (ref 78.0–100.0)
Platelets: 164 10*3/uL (ref 150–400)
RBC: 2.95 MIL/uL — ABNORMAL LOW (ref 4.22–5.81)
RDW: 13.7 % (ref 11.5–15.5)
WBC: 10.5 10*3/uL (ref 4.0–10.5)

## 2017-05-15 LAB — GLUCOSE, CAPILLARY
GLUCOSE-CAPILLARY: 121 mg/dL — AB (ref 65–99)
Glucose-Capillary: 120 mg/dL — ABNORMAL HIGH (ref 65–99)
Glucose-Capillary: 157 mg/dL — ABNORMAL HIGH (ref 65–99)
Glucose-Capillary: 101 mg/dL — ABNORMAL HIGH (ref 65–99)

## 2017-05-15 MED ORDER — METFORMIN HCL ER 500 MG PO TB24
1000.0000 mg | ORAL_TABLET | Freq: Every day | ORAL | Status: DC
  Administered 2017-05-15 – 2017-05-24 (×10): 1000 mg via ORAL
  Filled 2017-05-15 (×10): qty 2

## 2017-05-15 MED ORDER — LINAGLIPTIN 5 MG PO TABS
5.0000 mg | ORAL_TABLET | Freq: Every day | ORAL | Status: DC
Start: 2017-05-15 — End: 2017-05-24
  Administered 2017-05-15 – 2017-05-24 (×10): 5 mg via ORAL
  Filled 2017-05-15 (×10): qty 1

## 2017-05-15 MED ORDER — METOPROLOL TARTRATE 25 MG PO TABS
25.0000 mg | ORAL_TABLET | Freq: Two times a day (BID) | ORAL | Status: DC
  Administered 2017-05-15 – 2017-05-24 (×19): 25 mg via ORAL
  Filled 2017-05-15 (×19): qty 1

## 2017-05-15 NOTE — Discharge Summary (Signed)
Physician Discharge Summary  Patient ID: [name] Hammond MRN: [id number] DOB/AGE: 56/06/1961 56 y.o.  Admit date: 05/08/2017 Discharge date: 05/23/2017  Admission Diagnoses:  Patient Active Problem List   Diagnosis Date Noted  . Coronary artery disease 05/12/2017  . Unstable angina (HCC)   . Hypersomnia with sleep apnea 03/17/2017  . Excessive daytime sleepiness 03/17/2017  . Sleep deprivation 03/17/2017  . Morbid obesity (HCC) 03/17/2017  . Snoring 03/17/2017   Discharge Diagnoses:   Patient Active Problem List   Diagnosis Date Noted  . S/P CABG x 3   . Coronary artery disease 05/12/2017  . Unstable angina (HCC)   . Hypersomnia with sleep apnea 03/17/2017  . Excessive daytime sleepiness 03/17/2017  . Sleep deprivation 03/17/2017  . Morbid obesity (HCC) 03/17/2017  . Snoring 03/17/2017   Discharged Condition: good  History of Present Illness:  [name] Hammond is a 56 yo morbidly obese AA male with known history of HTN, OSA, hypothyroidism, and diabetes.  The patient presented to to his PCP in February with complaints of intermittent left sided chest pain.  He admitted for the past 2-3 months that he noted substernal chest pressure. This typically occurs when the patient was laying down, worse if at an angle, better if laying flat. The patient felt this was relatable to eating late at night.  The pain was 7/10 without radiation.  He attempted relief with antacids and apple cider vinegar.  Due to this he was referred to [facility] heart care for evaluation.  He was seen by Dr. Duke Salviaandolph who felt the patients complaints were not classic angina.  However, it was indicated that stress test should be performed.  This was done on 04/28/2017 and was positive for ischemia.  He was subsequently scheduled for cardiac catheterization.  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  It was felt due to the location of his CAD, coronary bypass grafting would be indicated and TCTS consult  has been requested.    Hospital Course:   [name] Hammond was evaluated by Dr. Donata ClayVan Trigt who was in agreement the patient would benefit from coronary bypass surgery.  The risks and benefits of the procedure were explained to the patient and he was agreeable to proceed.  The patient remained chest pain free prior to surgery.  The patient was taken to the operating room on 05/12/2017.  He underwent CABG x 3 utilizing LIMA to LAD, SVG to Diagonal, and SVG to Circumflex.  He also underwent endoscopic harvest of greater saphenous vein from right leg.  His left leg was explored, but no vein was harvested.  He tolerated the procedure without difficulty and was taken to SICU in stable condition.  The patient was extubated the evening of surgery.  During his stay in the SICU the patient was weaned off Milrinone and Dopamine as tolerated.  The patient chest tubes and arterial lines were removed without difficulty.  He was maintaining NSR and ambulating in the SICU without difficulty.  He was medically stable for transfer to the step down unit on POD #2.  The patient continues to make progress.  He has some mild tachycardia and his beta blocker was titrated.  His pacing wires were removed without difficulty.  His blood sugars were controlled and he was transitioned to his oral diabetic medications.  He continues to ambulate without difficulty.  His pain is well controlled. He did have some drainage of hematoma from left leg EVH site which has slowed and he has  no evidence of infection. He is instructed to do BID dressing changes at home.  He is medically stable for discharge home today. Home nursing will be arranged for restorative care.  Significant Diagnostic Studies: angiography:    Mid RCA lesion, 10 %stenosed.  Ost LM to LM lesion, 50 %stenosed.  Ost Cx to Prox Cx lesion, 60 %stenosed.  Ost LAD to Prox LAD lesion, 90 %stenosed.  LM lesion, 50 %stenosed.  3rd Mrg lesion, 30 %stenosed.  The left ventricular ejection  fraction is greater than 65% by visual estimate.  The left ventricular systolic function is normal.  LV end diastolic pressure is normal.  There is no mitral valve regurgitation.  Ost 1st Diag to 1st Diag lesion, 50 %stenosed.   1. Severe ostial LAD stenosis with moderate stenosis in the distal left main artery and ostial Circumflex artery. This is confirmed with IVUS imaging.  2. Mild plaque in the mid RCA and third OM branch 3. Moderate stenosis in the first diagonal branch 4. Normal LV systolic function  Treatments: surgery:   1. Coronary artery bypass grafting x3 (left internal mammary artery to     left anterior descending artery, saphenous vein graft to diagonal,     saphenous vein graft to circumflex marginal). 2. Endoscopic harvest of right leg greater saphenous vein. 3. Exposure, but not harvest of left leg greater saphenous vein.  Disposition: Home  Discharge medications:  The patient has been discharged on:   1.Beta Blocker:  Yes [ x  ]                              No   [   ]                              If No, reason:  2.Ace Inhibitor/ARB: Yes [ x  ]                                     No  [    ]                                     If No, reason:  3.Statin:   Yes [ x  ]                  No  [   ]                  If No, reason:  4.Ecasa:  Yes  [ x ]                  No   [   ]                  If No, reason:   Discharge Instructions    Amb Referral to Cardiac Rehabilitation    Complete by:  As directed    Diagnosis:  CABG   CABG X ___:  3   Discharge patient    Complete by:  As directed    Home health to assist with [facility] site dressing changes   Discharge disposition:  01-Home or Self Care   Discharge patient date:  05/24/2017     Allergies  as of 05/24/2017      Reactions   No Known Allergies       Medication List    STOP taking these medications   neomycin-bacitracin-polymyxin ointment Commonly known as:  NEOSPORIN     TAKE these  medications   aspirin 325 MG EC tablet Take 1 tablet (325 mg total) by mouth daily. What changed:  medication strength  how much to take  when to take this   atorvastatin 80 MG tablet Commonly known as:  LIPITOR Take 1 tablet (80 mg total) by mouth at bedtime. What changed:  medication strength  how much to take   calcium carbonate 500 MG chewable tablet Commonly known as:  TUMS - dosed in mg elemental calcium Chew 1-2 tablets by mouth at bedtime as needed for indigestion or heartburn.   KOMBIGLYZE XR 04-999 MG Tb24 Generic drug:  Saxagliptin-Metformin Take 1 tablet by mouth daily with breakfast.   levothyroxine 100 MCG tablet Commonly known as:  SYNTHROID, LEVOTHROID Take 100 mcg by mouth daily before breakfast.   lisinopril-hydrochlorothiazide 20-12.5 MG tablet Commonly known as:  PRINZIDE,ZESTORETIC Take 1 tablet by mouth daily at 12 noon.   metoprolol tartrate 25 MG tablet Commonly known as:  LOPRESSOR Take 1 tablet (25 mg total) by mouth 2 (two) times daily.   oxyCODONE 5 MG immediate release tablet Commonly known as:  Oxy IR/ROXICODONE Take 1-2 tablets (5-10 mg total) by mouth every 6 (six) hours as needed for severe pain.   sertraline 50 MG tablet Commonly known as:  ZOLOFT Take 50 mg by mouth every evening.            Durable Medical Equipment        Start     Ordered   05/15/17 1653  For home use only DME continuous positive airway pressure (CPAP)  Once    Question Answer Comment  Patient has OSA or probable OSA Yes   Is the patient currently using CPAP in the home No   If yes (to question two) Determine DME provider and inform them of any new orders/settings   Date of face to face encounter arranged 4-20   Settings Other see comments   CPAP supplies needed Mask, headgear, cushions, filters, heated tubing and water chamber      05/15/17 1653     Follow-up Information    [facility]  Follow up on  06/15/2017.   Specialty:  Cardiothoracic Surgery Why:  Appointment is at 1:30 Contact information: [address] [place], [address] [place] [address] [phone]       Azalee Course, [place]. Go on 05/29/2017.   Specialties:  Cardiology, Radiology Why:  @ 10:30am  Contact information: [address] [place] [address] [phone]        [facility] - Dme Follow up.   Why:  CPAP for home-  (pt will need to take mask home that he used here) Contact information: [address] [place] [address] [phone]        [facility]Home Follow up.   Why:  For home Health RN. They will contact you and set up first home visit 1-2 days after discharge.  Contact information: [address] [place] [address] [phone]           Signed: [name] 05/24/2017, 8:53 AM

## 2017-05-15 NOTE — Care Management Note (Signed)
Case Management Note Original Note Created Gala LewandowskyGraves-Bigelow, Brenda Kaye, RN 05/11/2017, 11:45 AM   Patient Details  Name: [name] Hammond MRN: [id number] Date of Birth: 10/12/1961  Subjective/Objective:  Pt presented for chest pain-post cardiac cath 05-08-17 revealed severe multivessel CAD. Plan for CABG Tuesday 05-12-17. Pt is from home with wife. PTA pt was working and independent.                  Action/Plan: CM will continue to monitor for disposition needs post procedure   Expected Discharge Date:                  Expected Discharge Plan:  Home w Home Health Services  In-House Referral:     Discharge planning Services  CM Consult  Post Acute Care Choice:  Durable Medical Equipment Choice offered to:  Patient  DME Arranged:  Continuous positive airway pressure (CPAP) DME Agency:  [facility]  HH Arranged:    HH Agency:     Status of Service:  In process, will continue to follow  If discussed at Long Length of Stay Meetings, dates discussed:  5/24  Discharge Disposition:    Additional Comments:  05/15/17- 1645- Donn PieriniKristi Martavion Couper RN, CM- referral for CPAP need received- spoke with pt at bedside- per pt he had sleep study done about 3 weeks ago at the [facility] on [address] - call made to center but they have closed for the day/weekend- pt does not know who is CPAP was to be set up with- [facility] can [facility] CPAP for home with DME order- have spoken with Dr. Maren BeachVanTrigt who gave verbal for order[facility]-   05/13/17- 1000- Amato Sevillano RN, CM- pt s/p CABGx3- on 05/12/17- tx 2H post op - CM to continue to follow for d/c needs  Darrold SpanWebster, Mikiah Durall Hall, RN 05/15/2017, 5:09 PM [phone]

## 2017-05-15 NOTE — Progress Notes (Signed)
Patient education completed for removal of pacer wires, patient verbalized understanding ,4 pacer wires removed as ordered, patient now on bedrest will continue to monitor.

## 2017-05-15 NOTE — Progress Notes (Addendum)
      [address]       [place] [id number]             [phone]      3 Days Post-Op Procedure(s) (LRB): CORONARY ARTERY BYPASS GRAFTING (CABG) x three , using left inernal mammary artery and left leg     greater saphenous vein harvested endoscopically (N/A) TRANSESOPHAGEAL ECHOCARDIOGRAM (TEE) (N/A)   Subjective:  [name] has no specific complaints.  He looks great and is feeling better.  His appetite has come back.  Objective: Vital signs in last 24 hours: Temp:  [98.3 F (36.8 C)-98.7 F (37.1 C)] 98.6 F (37 C) (05/25 0700) Pulse Rate:  [75-94] 75 (05/25 0700) Cardiac Rhythm: Normal sinus rhythm (05/25 0700) Resp:  [11-24] 18 (05/25 0700) BP: (103-127)/(56-78) 116/65 (05/25 0700) SpO2:  [93 %-99 %] 97 % (05/25 0700) Weight:  [309 lb 1.6 oz (140.2 kg)] 309 lb 1.6 oz (140.2 kg) (05/25 0700)  Intake/Output from previous day: 05/24 0701 - 05/25 0700 In: 545.1 [P.O.:480; I.V.:65.1] Out: 1150 [Urine:1150]  General appearance: alert, cooperative and no distress Heart: regular rate and rhythm Lungs: clear to auscultation bilaterally Abdomen: soft, non-tender; bowel sounds normal; no masses,  no organomegaly Extremities: edema trace Wound: clean and dry  Lab Results:  Recent Labs  05/14/17 0405 05/15/17 0233  WBC 11.4* 10.5  HGB 8.9* 8.5*  HCT 26.6* 26.2*  PLT 144* 164   BMET:  Recent Labs  05/14/17 0405 05/15/17 0233  NA 135 136  K 4.1 3.8  CL 104 103  CO2 26 28  GLUCOSE 132* 120*  BUN 12 13  CREATININE 0.83 0.85  CALCIUM 7.3* 7.5*    PT/INR:  Recent Labs  05/12/17 1430  LABPROT 18.3*  INR 1.50   ABG    Component Value Date/Time   PHART 7.347 (L) 05/13/2017 0315   HCO3 23.7 05/13/2017 0315   TCO2 25 05/13/2017 1709   ACIDBASEDEF 1.2 05/13/2017 0315   O2SAT 93.6 05/13/2017 0315   CBG (last 3)   Recent Labs  05/14/17 1642 05/14/17 2101 05/15/17 0619  GLUCAP 131* 118* 120*    Assessment/Plan: S/P Procedure(s)  (LRB): CORONARY ARTERY BYPASS GRAFTING (CABG) x three , using left inernal mammary artery and left leg     greater saphenous vein harvested endoscopically (N/A) TRANSESOPHAGEAL ECHOCARDIOGRAM (TEE) (N/A)  1. CV- Sinus Tach- will increase Lopressor to 25 mg BID 2. Pulm- off oxygen, CXR with atelectasis bilaterally, no significant effusion, continue IS 3. Renal- creatinine WNL, weight remains elevated, continue Lasix, potassium supplementation 4. Expected post operative blood loss anemia stable, Hgb at 8.5 5. DM- sugars controlled, will d/c Levemir restart home medications 6. Dispo- patient stable, mild tachycardia will titrate BB, will d/c EPW, continue diuresis,    LOS: 7 days    [name] PitcherBARRETT, [name] 05/15/2017  He has severe OSA and desats to 55% at night documented by sleep study He cannot go home after CABG until he is fitted with a CPAP mask and has home equipment in place+ His appt at the sleep center will need to be reset for tues am so he can get setup on way home unless someone can come to hospital- need care manager involvement[ordered]  patient examined and medical record reviewed,agree with above note. Kathlee Nationseter Van Trigt III 05/15/2017

## 2017-05-15 NOTE — Progress Notes (Signed)
CARDIAC REHAB PHASE I   PRE:  Rate/Rhythm: 70 SR    BP: sitting 107/92    SaO2: 100 RA  MODE:  Ambulation: 650 ft   POST:  Rate/Rhythm: 110 ST    BP: sitting 134/75     SaO2: 100 RA  Pt able to walk without RW. Steady, gets SOB with distance. Quick pace. No major c/o. Moving well. Ed completed with pt and wife. Good reception, appropriate diet questions. He is not a smoker. Will send referral for [facility]. This was his third walk today. [phone]   Harriet MassonRandi Kristan Narda Fundora CES, ACSM 05/15/2017 11:53 AM

## 2017-05-16 LAB — GLUCOSE, CAPILLARY
GLUCOSE-CAPILLARY: 99 mg/dL (ref 65–99)
GLUCOSE-CAPILLARY: 113 mg/dL — AB (ref 65–99)
Glucose-Capillary: 138 mg/dL — ABNORMAL HIGH (ref 65–99)
Glucose-Capillary: 129 mg/dL — ABNORMAL HIGH (ref 65–99)

## 2017-05-16 MED ORDER — LISINOPRIL 10 MG PO TABS
10.0000 mg | ORAL_TABLET | Freq: Every day | ORAL | Status: DC
  Administered 2017-05-16 – 2017-05-24 (×9): 10 mg via ORAL
  Filled 2017-05-16 (×9): qty 1

## 2017-05-16 NOTE — Progress Notes (Addendum)
[address]       [place] [id number]             [phone]      4 Days Post-Op Procedure(s) (LRB): CORONARY ARTERY BYPASS GRAFTING (CABG) x three , using left inernal mammary artery and left leg     greater saphenous vein harvested endoscopically (N/A) TRANSESOPHAGEAL ECHOCARDIOGRAM (TEE) (N/A) Subjective: conts to make good progress, feels pretty well  Objective: Vital signs in last 24 hours: Temp:  [97.5 F (36.4 C)-98.7 F (37.1 C)] 97.5 F (36.4 C) (05/26 0513) Pulse Rate:  [73-84] 73 (05/26 0513) Cardiac Rhythm: Normal sinus rhythm (05/26 0700) Resp:  [18-20] 18 (05/26 0513) BP: (108-135)/(59-68) 129/66 (05/26 0513) SpO2:  [94 %-99 %] 98 % (05/26 0513) Weight:  [310 lb 4.8 oz (140.8 kg)] 310 lb 4.8 oz (140.8 kg) (05/26 0513)  Hemodynamic parameters for last 24 hours:    Intake/Output from previous day: 05/25 0701 - 05/26 0700 In: 480 [P.O.:480] Out: -  Intake/Output this shift: No intake/output data recorded.  General appearance: alert, cooperative and no distress Heart: regular rate and rhythm Lungs: min dim in the bases Abdomen: benign Extremities: + LE edema Wound: incis healing well  Lab Results:  Recent Labs  05/14/17 0405 05/15/17 0233  WBC 11.4* 10.5  HGB 8.9* 8.5*  HCT 26.6* 26.2*  PLT 144* 164   BMET:  Recent Labs  05/14/17 0405 05/15/17 0233  NA 135 136  K 4.1 3.8  CL 104 103  CO2 26 28  GLUCOSE 132* 120*  BUN 12 13  CREATININE 0.83 0.85  CALCIUM 7.3* 7.5*    PT/INR: No results for input(s): LABPROT, INR in the last 72 hours. ABG    Component Value Date/Time   PHART 7.347 (L) 05/13/2017 0315   HCO3 23.7 05/13/2017 0315   TCO2 25 05/13/2017 1709   ACIDBASEDEF 1.2 05/13/2017 0315   O2SAT 93.6 05/13/2017 0315   CBG (last 3)   Recent Labs  05/15/17 1631 05/15/17 2104 05/16/17 0613  GLUCAP 101* 121* 138*    Meds Scheduled Meds: . acetaminophen  1,000 mg Oral Q6H   Or  . acetaminophen  (TYLENOL) oral liquid 160 mg/5 mL  1,000 mg Per Tube Q6H  . aspirin EC  325 mg Oral Daily   Or  . aspirin  324 mg Per Tube Daily  . atorvastatin  80 mg Oral QHS  . bisacodyl  10 mg Oral Daily   Or  . bisacodyl  10 mg Rectal Daily  . docusate sodium  200 mg Oral Daily  . furosemide  40 mg Oral Daily  . insulin aspart  0-24 Units Subcutaneous TID AC & HS  . levothyroxine  100 mcg Oral QAC breakfast  . linagliptin  5 mg Oral Q breakfast  . mouth rinse  15 mL Mouth Rinse BID  . metFORMIN  1,000 mg Oral Q breakfast  . metoCLOPramide (REGLAN) injection  10 mg Intravenous Q6H  . metoprolol tartrate  25 mg Oral BID  . pantoprazole  40 mg Oral Daily  . potassium chloride  20 mEq Oral BID  . sertraline  50 mg Oral QPM  . sodium chloride flush  3 mL Intravenous Q12H  . sodium chloride flush  3 mL Intravenous Q12H   Continuous Infusions: . sodium chloride    . sodium chloride    . sodium chloride    . sodium chloride    . lactated ringers Stopped (05/14/17  1000)  . lactated ringers     PRN Meds:.sodium chloride, sodium chloride, ALPRAZolam, metoprolol tartrate, morphine injection, ondansetron (ZOFRAN) IV, oxyCODONE, sodium chloride flush, sodium chloride flush, traMADol  Xrays Dg Chest 1 View  Result Date: 05/15/2017 CLINICAL DATA:  Sore chest ,lat view only because pt had ap port this am,,hx cabg EXAM: CHEST  1 VIEW COMPARISON:  Earlier film of the same day, and previous studies FINDINGS: There is blunting of posterior costophrenic angles suggesting small effusions. Adjacent subsegmental atelectasis/ consolidation posteriorly in the lower lobes. Trace fluid in the interlobar fissures.No overt interstitial edema. Change since CABG.  No pneumothorax. IMPRESSION: Small bilateral pleural effusions Electronically Signed   By: Corlis Leak M.D.   On: 05/15/2017 07:51   Dg Chest Port 1 View  Result Date: 05/15/2017 CLINICAL DATA:  Shortness of breath . EXAM: PORTABLE CHEST 1 VIEW COMPARISON:   05/14/2017 . FINDINGS: Interval removal of right IJ sheath, mediastinal drainage catheter, and left chest tube. Prior CABG. Cardiomegaly. Low lung volumes with mild basilar subsegmental atelectasis. No pleural effusion or pneumothorax. IMPRESSION: 1. Interval removal of lines and tubes including left chest tube. No pneumothorax. 2.  Prior CABG.  Stable cardiomegaly.  No CHF. 3. Low lung volumes with mild basilar subsegmental atelectasis . Electronically Signed   By: Maisie Fus  Register   On: 05/15/2017 06:42    Assessment/Plan: S/P Procedure(s) (LRB): CORONARY ARTERY BYPASS GRAFTING (CABG) x three , using left inernal mammary artery and left leg     greater saphenous vein harvested endoscopically (N/A) TRANSESOPHAGEAL ECHOCARDIOGRAM (TEE) (N/A)   1 conts to do well 2 cont gentle diuresis 3 sugars adeq controlled, needs better diet control og CHO'/glucose long term 4 hemodyn stable in sinus, will add low dose ACE-I , hopefully will tolerate 5 CPAP being arranged for home by CM/[name] 6 poss d/c in am    LOS: 8 days    GOLD,WAYNE E 05/16/2017  I have seen and examined [name] Hammond and agree with the above assessment  and plan.  Delight Ovens MD Beeper [phone] Office [phone] 05/16/2017 11:18 AM

## 2017-05-17 LAB — GLUCOSE, CAPILLARY
GLUCOSE-CAPILLARY: 105 mg/dL — AB (ref 65–99)
GLUCOSE-CAPILLARY: 105 mg/dL — AB (ref 65–99)
Glucose-Capillary: 98 mg/dL (ref 65–99)
Glucose-Capillary: 107 mg/dL — ABNORMAL HIGH (ref 65–99)

## 2017-05-17 NOTE — Progress Notes (Addendum)
[address]       [place] [id number]             [phone]      5 Days Post-Op Procedure(s) (LRB): CORONARY ARTERY BYPASS GRAFTING (CABG) x three , using left inernal mammary artery and left leg     greater saphenous vein harvested endoscopically (N/A) TRANSESOPHAGEAL ECHOCARDIOGRAM (TEE) (N/A) Subjective: Feels ok, no specific c/o except left leg having significant drainage requiring frequent dressing changes  Objective: Vital signs in last 24 hours: Temp:  [97.5 F (36.4 C)-98.3 F (36.8 C)] 97.5 F (36.4 C) (05/27 0546) Pulse Rate:  [66-83] 66 (05/27 0546) Cardiac Rhythm: Normal sinus rhythm (05/26 1900) Resp:  [18] 18 (05/27 0546) BP: (111-124)/(58-73) 122/60 (05/26 2159) SpO2:  [98 %-100 %] 100 % (05/27 0546) Weight:  [308 lb 1.6 oz (139.8 kg)] 308 lb 1.6 oz (139.8 kg) (05/27 0546)  Hemodynamic parameters for last 24 hours:    Intake/Output from previous day: 05/26 0701 - 05/27 0700 In: 250 [P.O.:250] Out: -  Intake/Output this shift: No intake/output data recorded.  General appearance: alert, cooperative and no distress Heart: regular rate and rhythm Lungs: mildly dim in bases Abdomen: benign Extremities: + LE edema Wound: left leg with hematoma above knee with bloody drainage, no erethema  Lab Results:  Recent Labs  05/15/17 0233  WBC 10.5  HGB 8.5*  HCT 26.2*  PLT 164   BMET:  Recent Labs  05/15/17 0233  NA 136  K 3.8  CL 103  CO2 28  GLUCOSE 120*  BUN 13  CREATININE 0.85  CALCIUM 7.5*    PT/INR: No results for input(s): LABPROT, INR in the last 72 hours. ABG    Component Value Date/Time   PHART 7.347 (L) 05/13/2017 0315   HCO3 23.7 05/13/2017 0315   TCO2 25 05/13/2017 1709   ACIDBASEDEF 1.2 05/13/2017 0315   O2SAT 93.6 05/13/2017 0315   CBG (last 3)   Recent Labs  05/16/17 1726 05/16/17 2112 05/17/17 0631  GLUCAP 113* 129* 105*    Meds Scheduled Meds: . acetaminophen  1,000 mg Oral Q6H   Or    . acetaminophen (TYLENOL) oral liquid 160 mg/5 mL  1,000 mg Per Tube Q6H  . aspirin EC  325 mg Oral Daily   Or  . aspirin  324 mg Per Tube Daily  . atorvastatin  80 mg Oral QHS  . bisacodyl  10 mg Oral Daily   Or  . bisacodyl  10 mg Rectal Daily  . docusate sodium  200 mg Oral Daily  . furosemide  40 mg Oral Daily  . insulin aspart  0-24 Units Subcutaneous TID AC & HS  . levothyroxine  100 mcg Oral QAC breakfast  . linagliptin  5 mg Oral Q breakfast  . lisinopril  10 mg Oral Daily  . mouth rinse  15 mL Mouth Rinse BID  . metFORMIN  1,000 mg Oral Q breakfast  . metoCLOPramide (REGLAN) injection  10 mg Intravenous Q6H  . metoprolol tartrate  25 mg Oral BID  . pantoprazole  40 mg Oral Daily  . potassium chloride  20 mEq Oral BID  . sertraline  50 mg Oral QPM  . sodium chloride flush  3 mL Intravenous Q12H  . sodium chloride flush  3 mL Intravenous Q12H   Continuous Infusions: . sodium chloride    . sodium chloride    . sodium chloride    . sodium chloride    .  lactated ringers Stopped (05/14/17 1000)  . lactated ringers     PRN Meds:.sodium chloride, sodium chloride, ALPRAZolam, metoprolol tartrate, morphine injection, ondansetron (ZOFRAN) IV, oxyCODONE, sodium chloride flush, sodium chloride flush, traMADol  Xrays No results found.  Assessment/Plan: S/P Procedure(s) (LRB): CORONARY ARTERY BYPASS GRAFTING (CABG) x three , using left inernal mammary artery and left leg     greater saphenous vein harvested endoscopically (N/A) TRANSESOPHAGEAL ECHOCARDIOGRAM (TEE) (N/A) 1 steady progress 2 frequent dressing changes required currently for left leg- continue to monitor- does not appear to be infected  3 sugars well controlled- watching his carbs closer 4 home soon if CPAP arrangements can be done at home on holiday weekend and led drainage is more manageable 5 cont to diurese 6 cont routine rehab  LOS: 9 days    GOLD,WAYNE E 05/17/2017  Drainage from leg leg incision at  knee, does not appear infected  Poss home in am I have seen and examined [name] and agree with the above assessment  and plan.  Delight Ovens MD Beeper [phone] Office [phone] 05/17/2017 10:34 AM

## 2017-05-18 LAB — GLUCOSE, CAPILLARY
GLUCOSE-CAPILLARY: 102 mg/dL — AB (ref 65–99)
GLUCOSE-CAPILLARY: 111 mg/dL — AB (ref 65–99)
Glucose-Capillary: 108 mg/dL — ABNORMAL HIGH (ref 65–99)
Glucose-Capillary: 98 mg/dL (ref 65–99)

## 2017-05-18 NOTE — Progress Notes (Addendum)
      [address]       [place] [id number]             [phone]      6 Days Post-Op Procedure(s) (LRB): CORONARY ARTERY BYPASS GRAFTING (CABG) x three , using left inernal mammary artery and left leg     greater saphenous vein harvested endoscopically (N/A) TRANSESOPHAGEAL ECHOCARDIOGRAM (TEE) (N/A) Subjective: No issues overnight. Still having a decent amount of drainage from the left EVH site  Objective: Vital signs in last 24 hours: Temp:  [97.1 F (36.2 C)-97.8 F (36.6 C)] 97.1 F (36.2 C) (05/28 0447) Pulse Rate:  [68-82] 68 (05/28 0447) Cardiac Rhythm: Normal sinus rhythm (05/28 0722) Resp:  [18] 18 (05/28 0447) BP: (108-129)/(62-67) 126/66 (05/28 0447) SpO2:  [95 %-97 %] 95 % (05/28 0447) Weight:  [138 kg (304 lb 4.8 oz)] 138 kg (304 lb 4.8 oz) (05/28 0447)     Intake/Output from previous day: No intake/output data recorded. Intake/Output this shift: No intake/output data recorded.  General appearance: alert, cooperative and no distress Heart: regular rate and rhythm, S1, S2 normal, no murmur, click, rub or gallop Lungs: clear to auscultation bilaterally Abdomen: soft, non-tender; bowel sounds normal; no masses,  no organomegaly Extremities: brusing on bilateral inner thighs with hematoma under left leg incision Wound: clean and dry sternal incision. Blood tinged clear drainage from the left EVH site  Lab Results: No results for input(s): WBC, HGB, HCT, PLT in the last 72 hours. BMET: No results for input(s): NA, K, CL, CO2, GLUCOSE, BUN, CREATININE, CALCIUM in the last 72 hours.  PT/INR: No results for input(s): LABPROT, INR in the last 72 hours. ABG    Component Value Date/Time   PHART 7.347 (L) 05/13/2017 0315   HCO3 23.7 05/13/2017 0315   TCO2 25 05/13/2017 1709   ACIDBASEDEF 1.2 05/13/2017 0315   O2SAT 93.6 05/13/2017 0315   CBG (last 3)   Recent Labs  05/17/17 1647 05/17/17 2130 05/18/17 0619  GLUCAP 98 107* 102*     Assessment/Plan: S/P Procedure(s) (LRB): CORONARY ARTERY BYPASS GRAFTING (CABG) x three , using left inernal mammary artery and left leg     greater saphenous vein harvested endoscopically (N/A) TRANSESOPHAGEAL ECHOCARDIOGRAM (TEE) (N/A)  1 steady progress, recovering well 2 frequent dressing changes required currently for left leg- continue to monitor- does not appear to be infected. Being changed 3-4x a day. Clear bloody drainage.  3 sugars well controlled- watching his carbs closer 4 home soon if CPAP arrangements can be done at home on holiday weekend and leg drainage is more manageable.  5 cont to diurese 6 cont routine rehab   LOS: 10 days    Sharlene Doryessa N Conte 05/18/2017  Drainage from left leg vein harvest site, some separation of wound  Does not look infected  I have seen and examined [name] PalauJeffrey Hammond and agree with the above assessment  and plan.  Delight OvensEdward B Gurnoor Ursua MD Beeper [phone] Office [phone] 05/18/2017 9:45 AM

## 2017-05-18 NOTE — Progress Notes (Signed)
05/18/2017- Respiratory care note- CPAP set up for the patient for the night.

## 2017-05-19 LAB — GLUCOSE, CAPILLARY
GLUCOSE-CAPILLARY: 113 mg/dL — AB (ref 65–99)
GLUCOSE-CAPILLARY: 120 mg/dL — AB (ref 65–99)
GLUCOSE-CAPILLARY: 133 mg/dL — AB (ref 65–99)
Glucose-Capillary: 97 mg/dL (ref 65–99)

## 2017-05-19 MED ORDER — ASPIRIN 325 MG PO TBEC: 325.0000 mg | DELAYED_RELEASE_TABLET | Freq: Every day | ORAL | Status: DC

## 2017-05-19 MED ORDER — METOPROLOL TARTRATE 25 MG PO TABS: 25.0000 mg | ORAL_TABLET | Freq: Two times a day (BID) | ORAL | 1 refills | Status: DC

## 2017-05-19 MED ORDER — OXYCODONE HCL 5 MG PO TABS: 5.0000 mg | ORAL_TABLET | Freq: Four times a day (QID) | ORAL | 0 refills | Status: DC | PRN

## 2017-05-19 MED ORDER — ATORVASTATIN CALCIUM 80 MG PO TABS: 80.0000 mg | ORAL_TABLET | Freq: Every day | ORAL | 1 refills | Status: DC

## 2017-05-19 NOTE — Care Management Note (Addendum)
Case Management Note  Patient Details  Name: [name] PalauJeffrey Hammond MRN: [id number] Date of Birth: 02/15/1961  Subjective/Objective:                    Action/Plan: [name] and are processing [facility]. [name] will [facility] . [name] aware DC for today.  Patient aware of all of above. CM will call patient when [facility]   [name] will will come to patient's room today between 3 and 5 pm to set up CPAP. Patient aware.  Expected Discharge Date:  05/19/17               Expected Discharge Plan:  Home/Self Care  In-House Referral:     Discharge planning Services  CM Consult  Post Acute Care Choice:  Durable Medical Equipment Choice offered to:  Patient  DME Arranged:  Continuous positive airway pressure (CPAP) DME Agency:  [facility]  HH Arranged:    HH Agency:     Status of Service:  In process, will continue to follow  If discussed at Long Length of Stay Meetings, dates discussed:    Additional Comments:  [name], [name], RN 05/19/2017, 10:21 AM

## 2017-05-19 NOTE — Progress Notes (Signed)
[facility] will talk with them again tomorrow and [facility]

## 2017-05-19 NOTE — Progress Notes (Signed)
CM met with the patient and his wife and provided a list of [place] agencies. They wanted time to look over the list. Patient to discharge home with CPAP. Pt received call from [facility] and the machine should be available this pm and delivered to the room.  Per bedside RN: Dr Darcey Nora is not going to d/c him home until tomorrow d/t drainage from his lt leg. CM following.

## 2017-05-19 NOTE — Progress Notes (Signed)
CARDIAC REHAB PHASE I   Pt eating breakfast, states he is walking independently, states he has no questions regarding education at this time. Pt declines ambulation with cardiac rehab at this time. Pt for discharge today, will sign off.    Joylene GrapesEmily C Eilidh Marcano, RN, BSN 05/19/2017 8:31 AM

## 2017-05-19 NOTE — Progress Notes (Addendum)
[address]       [place] [address]             [phone]      7 Days Post-Op Procedure(s) (LRB): CORONARY ARTERY BYPASS GRAFTING (CABG) x three , using left inernal mammary artery and left leg     greater saphenous vein harvested endoscopically (N/A) TRANSESOPHAGEAL ECHOCARDIOGRAM (TEE) (N/A) Subjective: Less drainage from left leg incision  Objective: Vital signs in last 24 hours: Temp:  [98 F (36.7 C)-99.4 F (37.4 C)] 98.7 F (37.1 C) (05/29 0603) Pulse Rate:  [69-87] 69 (05/29 0603) Cardiac Rhythm: Normal sinus rhythm (05/28 1900) Resp:  [18] 18 (05/29 0603) BP: (117-128)/(56-66) 117/64 (05/29 0603) SpO2:  [98 %-100 %] 98 % (05/29 0603)  Hemodynamic parameters for last 24 hours:    Intake/Output from previous day: 05/28 0701 - 05/29 0700 In: 600 [P.O.:600] Out: -  Intake/Output this shift: No intake/output data recorded.  General appearance: alert, cooperative and no distress Heart: regular rate and rhythm Lungs: clear to auscultation bilaterally Abdomen: benign Extremities: minor edema Wound: incis without signs of infection  Lab Results: No results for input(s): WBC, HGB, HCT, PLT in the last 72 hours. BMET: No results for input(s): NA, K, CL, CO2, GLUCOSE, BUN, CREATININE, CALCIUM in the last 72 hours.  PT/INR: No results for input(s): LABPROT, INR in the last 72 hours. ABG    Component Value Date/Time   PHART 7.347 (L) 05/13/2017 0315   HCO3 23.7 05/13/2017 0315   TCO2 25 05/13/2017 1709   ACIDBASEDEF 1.2 05/13/2017 0315   O2SAT 93.6 05/13/2017 0315   CBG (last 3)   Recent Labs  05/18/17 1617 05/18/17 2207 05/19/17 0647  GLUCAP 108* 98 113*    Meds Scheduled Meds: . aspirin EC  325 mg Oral Daily   Or  . aspirin  324 mg Per Tube Daily  . atorvastatin  80 mg Oral QHS  . bisacodyl  10 mg Oral Daily   Or  . bisacodyl  10 mg Rectal Daily  . docusate sodium  200 mg Oral Daily  . furosemide  40 mg Oral Daily  .  insulin aspart  0-24 Units Subcutaneous TID AC & HS  . levothyroxine  100 mcg Oral QAC breakfast  . linagliptin  5 mg Oral Q breakfast  . lisinopril  10 mg Oral Daily  . mouth rinse  15 mL Mouth Rinse BID  . metFORMIN  1,000 mg Oral Q breakfast  . metoprolol tartrate  25 mg Oral BID  . pantoprazole  40 mg Oral Daily  . potassium chloride  20 mEq Oral BID  . sertraline  50 mg Oral QPM  . sodium chloride flush  3 mL Intravenous Q12H  . sodium chloride flush  3 mL Intravenous Q12H   Continuous Infusions: . sodium chloride    . sodium chloride    . sodium chloride    . sodium chloride    . lactated ringers Stopped (05/14/17 1000)  . lactated ringers     PRN Meds:.sodium chloride, sodium chloride, ALPRAZolam, metoprolol tartrate, morphine injection, ondansetron (ZOFRAN) IV, oxyCODONE, sodium chloride flush, sodium chloride flush, traMADol  Xrays No results found.  Assessment/Plan: S/P Procedure(s) (LRB): CORONARY ARTERY BYPASS GRAFTING (CABG) x three , using left inernal mammary artery and left leg     greater saphenous vein harvested endoscopically (N/A) TRANSESOPHAGEAL ECHOCARDIOGRAM (TEE) (N/A)   1 doing well overall 2 no evidence of incisional infection 3 sugars  adeq controlled, he is motivated for dietary changes 4 home if cpap arranged by CM with [facility]   LOS: 11 days    GOLD,WAYNE E 05/19/2017 Patient's CPAP mask apparently will not be delivered until tonight Will keep patient for observation overnight with his new mask and plan discharge in a.m. patient examined and medical record reviewed,agree with above note. Kathlee Nationseter Van Trigt III 05/19/2017

## 2017-05-20 ENCOUNTER — Telehealth (HOSPITAL_COMMUNITY): Payer: Self-pay

## 2017-05-20 LAB — GLUCOSE, CAPILLARY
GLUCOSE-CAPILLARY: 98 mg/dL (ref 65–99)
GLUCOSE-CAPILLARY: 101 mg/dL — AB (ref 65–99)
Glucose-Capillary: 92 mg/dL (ref 65–99)
Glucose-Capillary: 117 mg/dL — ABNORMAL HIGH (ref 65–99)

## 2017-05-20 NOTE — Progress Notes (Signed)
Negative pressure dressing applied. [name] ([facility] RN) at bedside, applied [facility] vac.  Leonidas Rombergaitlin S Bumbledare, RN

## 2017-05-20 NOTE — Progress Notes (Addendum)
[address]       [place] [id number]             [phone]      8 Days Post-Op Procedure(s) (LRB): CORONARY ARTERY BYPASS GRAFTING (CABG) x three , using left inernal mammary artery and left leg     greater saphenous vein harvested endoscopically (N/A) TRANSESOPHAGEAL ECHOCARDIOGRAM (TEE) (N/A) Subjective: Feels ok, drainage conts to decrease  Objective: Vital signs in last 24 hours: Temp:  [97.6 F (36.4 C)-98.7 F (37.1 C)] 97.6 F (36.4 C) (05/30 0639) Pulse Rate:  [75-88] 75 (05/30 0639) Cardiac Rhythm: Normal sinus rhythm (05/29 1929) Resp:  [18-20] 18 (05/30 0639) BP: (103-147)/(59-74) 103/64 (05/30 0639) SpO2:  [100 %] 100 % (05/30 0639) Weight:  [307 lb 12.2 oz (139.6 kg)] 307 lb 12.2 oz (139.6 kg) (05/30 [id number])  Hemodynamic parameters for last 24 hours:    Intake/Output from previous day: 05/29 0701 - 05/30 0700 In: 720 [P.O.:720] Out: -  Intake/Output this shift: No intake/output data recorded.  General appearance: alert, cooperative and no distress Heart: regular rate and rhythm Lungs: mildly dim in bases Abdomen: benign Extremities: trace edema Wound: incis stable without signs of infection  Lab Results: No results for input(s): WBC, HGB, HCT, PLT in the last 72 hours. BMET: No results for input(s): NA, K, CL, CO2, GLUCOSE, BUN, CREATININE, CALCIUM in the last 72 hours.  PT/INR: No results for input(s): LABPROT, INR in the last 72 hours. ABG    Component Value Date/Time   PHART 7.347 (L) 05/13/2017 0315   HCO3 23.7 05/13/2017 0315   TCO2 25 05/13/2017 1709   ACIDBASEDEF 1.2 05/13/2017 0315   O2SAT 93.6 05/13/2017 0315   CBG (last 3)   Recent Labs  05/19/17 1620 05/19/17 2146 05/20/17 0637  GLUCAP 133* 97 98    Meds Scheduled Meds: . aspirin EC  325 mg Oral Daily   Or  . aspirin  324 mg Per Tube Daily  . atorvastatin  80 mg Oral QHS  . bisacodyl  10 mg Oral Daily   Or  . bisacodyl  10 mg Rectal Daily  .  docusate sodium  200 mg Oral Daily  . furosemide  40 mg Oral Daily  . insulin aspart  0-24 Units Subcutaneous TID AC & HS  . levothyroxine  100 mcg Oral QAC breakfast  . linagliptin  5 mg Oral Q breakfast  . lisinopril  10 mg Oral Daily  . mouth rinse  15 mL Mouth Rinse BID  . metFORMIN  1,000 mg Oral Q breakfast  . metoprolol tartrate  25 mg Oral BID  . pantoprazole  40 mg Oral Daily  . potassium chloride  20 mEq Oral BID  . sertraline  50 mg Oral QPM  . sodium chloride flush  3 mL Intravenous Q12H  . sodium chloride flush  3 mL Intravenous Q12H   Continuous Infusions: . sodium chloride    . sodium chloride    . sodium chloride    . sodium chloride    . lactated ringers Stopped (05/14/17 1000)  . lactated ringers     PRN Meds:.sodium chloride, sodium chloride, ALPRAZolam, metoprolol tartrate, morphine injection, ondansetron (ZOFRAN) IV, oxyCODONE, sodium chloride flush, sodium chloride flush, traMADol  Xrays No results found.  Assessment/Plan: S/P Procedure(s) (LRB): CORONARY ARTERY BYPASS GRAFTING (CABG) x three , using left inernal mammary artery and left leg     greater saphenous vein harvested endoscopically (N/A) TRANSESOPHAGEAL ECHOCARDIOGRAM (  TEE) (N/A) Plan for discharge: see discharge orders   LOS: 12 days    GOLD,WAYNE E 05/20/2017  During personal inspection of left leg wound a large left thigh hematoma drained through an opening in the above-the-knee incision with large amounts of clotted and non-clotted blood removed. The wound was cleaned and Betadine applied with a dry dressing after the hematoma had been expressed.  The patient will need to remain in the hospital to observe the status of the draining large left thigh hematoma. Continue aspirin, stop Lovenox Check CBC tomorrow  Kathlee Nations trigt M.D.

## 2017-05-20 NOTE — Telephone Encounter (Signed)
Verified [employer] insurance benefits through [employer]. No Copay, Coinsurance 20%, Deductible $1200.00, pt has met $1200.00 Out of Pocket $3200.00, pt has met $1420.31 Reference [phone].... KJ

## 2017-05-20 NOTE — Care Management Note (Addendum)
Case Management Note  Patient Details  Name: [name] PalauJeffrey Hammond MRN: [id number] Date of Birth: 06/11/1961  Subjective/Objective:                 CPAP delivered to room. Patient and wife would [facility], clinical liaison [facility]. Upon assessment with Dr Alla GermanVanTright this am nurse states large amount of blood drained from incision and MD plan is to DC tomorrow.  [facility] could not take payor. Will offer choice again. [facility], referral accepted by [name], clinical liaison [name].    Action/Plan:   Expected Discharge Date:  05/20/17               Expected Discharge Plan:  Home w Home Health Services  In-House Referral:     Discharge planning Services  CM Consult  Post Acute Care Choice:  Durable Medical Equipment Choice offered to:  Patient, Spouse  DME Arranged:  Continuous positive airway pressure (CPAP) DME Agency:  [facility]  HH Arr[facility]  [facility]  Status of Service:  Completed, signed off  If discussed at Long Length of Stay Meetings, dates discussed:    Additional Comments:  Lawerance SabalDebbie Quantavis Obryant, RN 05/20/2017, 10:01 AM

## 2017-05-21 LAB — GLUCOSE, CAPILLARY
GLUCOSE-CAPILLARY: 105 mg/dL — AB (ref 65–99)
GLUCOSE-CAPILLARY: 97 mg/dL (ref 65–99)
Glucose-Capillary: 111 mg/dL — ABNORMAL HIGH (ref 65–99)
Glucose-Capillary: 112 mg/dL — ABNORMAL HIGH (ref 65–99)

## 2017-05-21 LAB — CBC
HCT: 28.5 % — ABNORMAL LOW (ref 39.0–52.0)
Hemoglobin: 9.1 g/dL — ABNORMAL LOW (ref 13.0–17.0)
MCH: 29.2 pg (ref 26.0–34.0)
MCHC: 31.9 g/dL (ref 30.0–36.0)
MCV: 91.3 fL (ref 78.0–100.0)
Platelets: 409 10*3/uL — ABNORMAL HIGH (ref 150–400)
RBC: 3.12 MIL/uL — ABNORMAL LOW (ref 4.22–5.81)
RDW: 14.7 % (ref 11.5–15.5)
WBC: 11 10*3/uL — ABNORMAL HIGH (ref 4.0–10.5)

## 2017-05-21 NOTE — Progress Notes (Signed)
Pt. States he can place cpap on himself when he is ready. RT filled pt. Water chamber up for him. No issues at this time.

## 2017-05-21 NOTE — Progress Notes (Addendum)
[address]       [place] [id number]             [phone]      9 Days Post-Op Procedure(s) (LRB): CORONARY ARTERY BYPASS GRAFTING (CABG) x three , using left inernal mammary artery and left leg     greater saphenous vein harvested endoscopically (N/A) TRANSESOPHAGEAL ECHOCARDIOGRAM (TEE) (N/A) Subjective: Decreasing serosang drainage from left leg evh site, yesterday hematoma was expressed so discharge postponed  Objective: Vital signs in last 24 hours: Temp:  [97.5 F (36.4 C)-98.1 F (36.7 C)] 97.5 F (36.4 C) (05/31 0546) Pulse Rate:  [61-83] 61 (05/31 0546) Cardiac Rhythm: Normal sinus rhythm (05/30 1900) Resp:  [19-20] 19 (05/31 0546) BP: (97-109)/(54-68) 103/56 (05/31 0546) SpO2:  [98 %-100 %] 98 % (05/31 0546) Weight:  [298 lb 4.8 oz (135.3 kg)] 298 lb 4.8 oz (135.3 kg) (05/31 0546)  Hemodynamic parameters for last 24 hours:    Intake/Output from previous day: 05/30 0701 - 05/31 0700 In: 1062 [P.O.:1062] Out: -  Intake/Output this shift: No intake/output data recorded.  General appearance: alert, cooperative and no distress Heart: regular rate and rhythm Lungs: dim in bases Abdomen: soft, non-tender Extremities: min edema Wound: incis without signs of infection  Lab Results:  Recent Labs  05/21/17 0229  WBC 11.0*  HGB 9.1*  HCT 28.5*  PLT 409*   BMET: No results for input(s): NA, K, CL, CO2, GLUCOSE, BUN, CREATININE, CALCIUM in the last 72 hours.  PT/INR: No results for input(s): LABPROT, INR in the last 72 hours. ABG    Component Value Date/Time   PHART 7.347 (L) 05/13/2017 0315   HCO3 23.7 05/13/2017 0315   TCO2 25 05/13/2017 1709   ACIDBASEDEF 1.2 05/13/2017 0315   O2SAT 93.6 05/13/2017 0315   CBG (last 3)   Recent Labs  05/20/17 1645 05/20/17 2059 05/21/17 0615  GLUCAP 117* 101* 111*    Meds Scheduled Meds: . aspirin EC  325 mg Oral Daily   Or  . aspirin  324 mg Per Tube Daily  . atorvastatin  80 mg  Oral QHS  . bisacodyl  10 mg Oral Daily   Or  . bisacodyl  10 mg Rectal Daily  . docusate sodium  200 mg Oral Daily  . furosemide  40 mg Oral Daily  . insulin aspart  0-24 Units Subcutaneous TID AC & HS  . levothyroxine  100 mcg Oral QAC breakfast  . linagliptin  5 mg Oral Q breakfast  . lisinopril  10 mg Oral Daily  . mouth rinse  15 mL Mouth Rinse BID  . metFORMIN  1,000 mg Oral Q breakfast  . metoprolol tartrate  25 mg Oral BID  . pantoprazole  40 mg Oral Daily  . potassium chloride  20 mEq Oral BID  . sertraline  50 mg Oral QPM  . sodium chloride flush  3 mL Intravenous Q12H  . sodium chloride flush  3 mL Intravenous Q12H   Continuous Infusions: . sodium chloride    . sodium chloride    . sodium chloride    . sodium chloride     PRN Meds:.sodium chloride, sodium chloride, ALPRAZolam, metoprolol tartrate, morphine injection, ondansetron (ZOFRAN) IV, oxyCODONE, sodium chloride flush, sodium chloride flush, traMADol  Xrays No results found.  Assessment/Plan: S/P Procedure(s) (LRB): CORONARY ARTERY BYPASS GRAFTING (CABG) x three , using left inernal mammary artery and left leg     greater saphenous vein harvested endoscopically (  N/A) TRANSESOPHAGEAL ECHOCARDIOGRAM (TEE) (N/A)  1 doing very well 2 cont wound care to evh site 3 sugars well controlled 4 hemodyn stable in sinus rhythm   LOS: 13 days    GOLD,WAYNE E 05/21/2017   Home with HHN ( already in place) when family can care for draining large left thigh hematoma in endovein tunnel CBC stable patient examined and medical record reviewed,agree with above note. Kathlee Nations Trigt III 05/21/2017

## 2017-05-22 LAB — GLUCOSE, CAPILLARY
GLUCOSE-CAPILLARY: 104 mg/dL — AB (ref 65–99)
GLUCOSE-CAPILLARY: 90 mg/dL (ref 65–99)
GLUCOSE-CAPILLARY: 102 mg/dL — AB (ref 65–99)
Glucose-Capillary: 142 mg/dL — ABNORMAL HIGH (ref 65–99)

## 2017-05-22 NOTE — Progress Notes (Signed)
RT NOTE:  Pt will place himself on CPAP when he is ready for sleep. Pt understands to call if assistance needed.

## 2017-05-22 NOTE — Progress Notes (Addendum)
[address]       [place] [id number]             [phone]      10 Days Post-Op Procedure(s) (LRB): CORONARY ARTERY BYPASS GRAFTING (CABG) x three , using left inernal mammary artery and left leg     greater saphenous vein harvested endoscopically (N/A) TRANSESOPHAGEAL ECHOCARDIOGRAM (TEE) (N/A) Subjective: Feels well  Objective: Vital signs in last 24 hours: Temp:  [97.8 F (36.6 C)-98.7 F (37.1 C)] 98.2 F (36.8 C) (06/01 0403) Pulse Rate:  [71-78] 75 (06/01 0403) Cardiac Rhythm: Normal sinus rhythm (05/31 1900) Resp:  [18-20] 18 (06/01 0403) BP: (103-121)/(48-59) 109/52 (06/01 0403) SpO2:  [95 %-98 %] 98 % (06/01 0403) Weight:  [297 lb 14.4 oz (135.1 kg)] 297 lb 14.4 oz (135.1 kg) (06/01 0403)  Hemodynamic parameters for last 24 hours:    Intake/Output from previous day: 05/31 0701 - 06/01 0700 In: 3 [I.V.:3] Out: 575 [Urine:575] Intake/Output this shift: No intake/output data recorded.  General appearance: alert, cooperative and no distress Heart: regular rate and rhythm Lungs: mildly dim in bases Abdomen: benign Extremities: no edema Wound: incis stable, decreasing drainage from left leg  Lab Results:  Recent Labs  05/21/17 0229  WBC 11.0*  HGB 9.1*  HCT 28.5*  PLT 409*   BMET: No results for input(s): NA, K, CL, CO2, GLUCOSE, BUN, CREATININE, CALCIUM in the last 72 hours.  PT/INR: No results for input(s): LABPROT, INR in the last 72 hours. ABG    Component Value Date/Time   PHART 7.347 (L) 05/13/2017 0315   HCO3 23.7 05/13/2017 0315   TCO2 25 05/13/2017 1709   ACIDBASEDEF 1.2 05/13/2017 0315   O2SAT 93.6 05/13/2017 0315   CBG (last 3)   Recent Labs  05/21/17 1606 05/21/17 2104 05/22/17 0606  GLUCAP 112* 97 142*    Meds Scheduled Meds: . aspirin EC  325 mg Oral Daily   Or  . aspirin  324 mg Per Tube Daily  . atorvastatin  80 mg Oral QHS  . bisacodyl  10 mg Oral Daily   Or  . bisacodyl  10 mg Rectal  Daily  . docusate sodium  200 mg Oral Daily  . furosemide  40 mg Oral Daily  . insulin aspart  0-24 Units Subcutaneous TID AC & HS  . levothyroxine  100 mcg Oral QAC breakfast  . linagliptin  5 mg Oral Q breakfast  . lisinopril  10 mg Oral Daily  . mouth rinse  15 mL Mouth Rinse BID  . metFORMIN  1,000 mg Oral Q breakfast  . metoprolol tartrate  25 mg Oral BID  . pantoprazole  40 mg Oral Daily  . potassium chloride  20 mEq Oral BID  . sertraline  50 mg Oral QPM  . sodium chloride flush  3 mL Intravenous Q12H  . sodium chloride flush  3 mL Intravenous Q12H   Continuous Infusions: . sodium chloride    . sodium chloride    . sodium chloride    . sodium chloride     PRN Meds:.sodium chloride, sodium chloride, ALPRAZolam, metoprolol tartrate, morphine injection, ondansetron (ZOFRAN) IV, oxyCODONE, sodium chloride flush, sodium chloride flush, traMADol  Xrays No results found.  Assessment/Plan: S/P Procedure(s) (LRB): CORONARY ARTERY BYPASS GRAFTING (CABG) x three , using left inernal mammary artery and left leg     greater saphenous vein harvested endoscopically (N/A) TRANSESOPHAGEAL ECHOCARDIOGRAM (TEE) (N/A) 1 hemodyn stable in sinus rhythm  2 leg drainage improving 3 sugars controlled 4 home soon   LOS: 14 days    GOLD,WAYNE E 05/22/2017  L thigh hematoma drainage improved Observe one more day  HHN in place for outpatient monitoring patient examined and medical record reviewed,agree with above note. Kathlee Nations Trigt III 05/22/2017

## 2017-05-23 LAB — GLUCOSE, CAPILLARY
GLUCOSE-CAPILLARY: 135 mg/dL — AB (ref 65–99)
Glucose-Capillary: 103 mg/dL — ABNORMAL HIGH (ref 65–99)
Glucose-Capillary: 117 mg/dL — ABNORMAL HIGH (ref 65–99)
Glucose-Capillary: 84 mg/dL (ref 65–99)

## 2017-05-23 NOTE — Discharge Instructions (Signed)
Coronary Artery Bypass Grafting, Care After °This sheet gives you information about how to care for yourself after your procedure. Your health care provider may also give you more specific instructions. If you have problems or questions, contact your health care provider. °What can I expect after the procedure? °After the procedure, it is common to have: °· Nausea and a lack of appetite. °· Constipation. °· Weakness and fatigue. °· Depression or irritability. °· Pain or discomfort in your incision areas. °Follow these instructions at home: °Medicines  °· Take over-the-counter and prescription medicines only as told by your health care provider. Do not stop taking medicines or start any new medicines without approval from your health care provider. °· If you were prescribed an antibiotic medicine, take it as told by your health care provider. Do not stop taking the antibiotic even if you start to feel better. °· Do not drive or use heavy machinery while taking prescription pain medicine. °Incision care  °· Follow instructions from your health care provider about how to take care of your incisions. Make sure you: °¨ Wash your hands with soap and water before you change your bandage (dressing). If soap and water are not available, use hand sanitizer. °¨ Change your dressing as told by your health care provider. °¨ Leave stitches (sutures), skin glue, or adhesive strips in place. These skin closures may need to stay in place for 2 weeks or longer. If adhesive strip edges start to loosen and curl up, you may trim the loose edges. Do not remove adhesive strips completely unless your health care provider tells you to do that. °· Keep incision areas clean, dry, and protected. °· Check your incision areas every day for signs of infection. Check for: °¨ More redness, swelling, or pain. °¨ More fluid or blood. °¨ Warmth. °¨ Pus or a bad smell. °· If incisions were made in your legs: °¨ Avoid crossing your legs. °¨ Avoid  sitting for long periods of time. Change positions every 30 minutes. °¨ Raise (elevate) your legs when you are sitting. °Bathing  °· Do not take baths, swim, or use a hot tub until your health care provider approves. °· Only take sponge baths. Pat the incisions dry. Do not rub incisions with a washcloth or towel. °· Ask your health care provider when you can shower. °Eating and drinking  °· Eat foods that are high in fiber, such as raw fruits and vegetables, whole grains, beans, and nuts. Meats should be lean cut. Avoid canned, processed, and fried foods. This can help prevent constipation and is a recommended part of a heart-healthy diet. °· Drink enough fluid to keep your urine clear or pale yellow. °· Limit alcohol intake to no more than 1 drink a day for nonpregnant women and 2 drinks a day for men. One drink equals 12 oz of beer, 5 oz of wine, or 1½ oz of hard liquor. °Activity  °· Rest and limit your activity as told by your health care provider. You may be instructed to: °¨ Stop any activity right away if you have chest pain, shortness of breath, irregular heartbeats, or dizziness. Get help right away if you have any of these symptoms. °¨ Move around frequently for short periods or take short walks as directed by your health care provider. Gradually increase your activities. You may need physical therapy or cardiac rehabilitation to help strengthen your muscles and build your endurance. °¨ Avoid lifting, pushing, or pulling anything that is heavier than 10 lb (  4.5 kg) for at least 6 weeks or as told by your health care provider. °· Do not drive until your health care provider approves. °· Ask your health care provider when you may return to work. °· Ask your health care provider when you may resume sexual activity. °General instructions  °· Do not use any products that contain nicotine or tobacco, such as cigarettes and e-cigarettes. If you need help quitting, ask your health care provider. °· Take 2-3 deep  breaths every few hours during the day, while you recover. This helps expand your lungs and prevent complications like pneumonia after surgery. °· If you were given a device called an incentive spirometer, use it several times a day to practice deep breathing. Support your chest with a pillow or your arms when you take deep breaths or cough. °· Wear compression stockings as told by your health care provider. These stockings help to prevent blood clots and reduce swelling in your legs. °· Weigh yourself every day. This helps identify if your body is holding (retaining) fluid that may make your heart and lungs work harder. °· Keep all follow-up visits as told by your health care provider. This is important. °Contact a health care provider if: °· You have more redness, swelling, or pain around any incision. °· You have more fluid or blood coming from any incision. °· Any incision feels warm to the touch. °· You have pus or a bad smell coming from any incision °· You have a fever. °· You have swelling in your ankles or legs. °· You have pain in your legs. °· You gain 2 lb (0.9 kg) or more a day. °· You are nauseous or you vomit. °· You have diarrhea. °Get help right away if: °· You have chest pain that spreads to your jaw or arms. °· You are short of breath. °· You have a fast or irregular heartbeat. °· You notice a "clicking" in your breastbone (sternum) when you move. °· You have numbness or weakness in your arms or legs. °· You feel dizzy or light-headed. °Summary °· After the procedure, it is common to have pain or discomfort in the incision areas. °· Do not take baths, swim, or use a hot tub until your health care provider approves. °· Gradually increase your activities. You may need physical therapy or cardiac rehabilitation to help strengthen your muscles and build your endurance. °· Weigh yourself every day. This helps identify if your body is holding (retaining) fluid that may make your heart and lungs work  harder. °This information is not intended to replace advice given to you by your health care provider. Make sure you discuss any questions you have with your health care provider. °Document Released: 06/27/2005 Document Revised: 10/27/2016 Document Reviewed: 10/27/2016 °Elsevier Interactive Patient Education © 2017 [employer] ° ° °Endoscopic Saphenous Vein Harvesting, Care After °Refer to this sheet in the next few weeks. These instructions provide you with information about caring for yourself after your procedure. Your health care provider may also give you more specific instructions. Your treatment has been planned according to current medical practices, but problems sometimes occur. Call your health care provider if you have any problems or questions after your procedure. °What can I expect after the procedure? °After the procedure, it is common to have: °· Pain. °· Bruising. °· Swelling. °· Numbness. °Follow these instructions at home: °Medicine  °· Take over-the-counter and prescription medicines only as told by your health care provider. °· Do not   drive or operate heavy machinery while taking prescription pain medicine. °Incision care  ° °· Follow instructions from your health care provider about how to take care of the cut made during surgery (incision). Make sure you: °¨ Wash your hands with soap and water before you change your bandage (dressing). If soap and water are not available, use hand sanitizer. °¨ Change your dressing as told by your health care provider. °¨ Leave stitches (sutures), skin glue, or adhesive strips in place. These skin closures may need to be in place for 2 weeks or longer. If adhesive strip edges start to loosen and curl up, you may trim the loose edges. Do not remove adhesive strips completely unless your health care provider tells you to do that. °· Check your incision area every day for signs of infection. Check for: °¨ More redness, swelling, or pain. °¨ More fluid or  blood. °¨ Warmth. °¨ Pus or a bad smell. °General instructions  °· Raise (elevate) your legs above the level of your heart while you are sitting or lying down. °· Do any exercises your health care providers have given you. These may include deep breathing, coughing, and walking exercises. °· Do not shower, take baths, swim, or use a hot tub unless told by your health care provider. °· Wear your elastic stocking if told by your health care provider. °· Keep all follow-up visits as told by your health care provider. This is important. °Contact a health care provider if: °· Medicine does not help your pain. °· Your pain gets worse. °· You have new leg bruises or your leg bruises get bigger. °· You have a fever. °· Your leg feels numb. °· You have more redness, swelling, or pain around your incision. °· You have more fluid or blood coming from your incision. °· Your incision feels warm to the touch. °· You have pus or a bad smell coming from your incision. °Get help right away if: °· Your pain is severe. °· You develop pain, tenderness, warmth, redness, or swelling in any part of your leg. °· You have chest pain. °· You have trouble breathing. °This information is not intended to replace advice given to you by your health care provider. Make sure you discuss any questions you have with your health care provider. °Document Released: 08/20/2011 Document Revised: 05/15/2016 Document Reviewed: 10/22/2015 °Elsevier Interactive Patient Education © 2017 [employer] ° ° °

## 2017-05-23 NOTE — Progress Notes (Addendum)
      [address]       [place] [id number]             [phone]      11 Days Post-Op Procedure(s) (LRB): CORONARY ARTERY BYPASS GRAFTING (CABG) x three , using left inernal mammary artery and left leg     greater saphenous vein harvested endoscopically (N/A) TRANSESOPHAGEAL ECHOCARDIOGRAM (TEE) (N/A) Subjective: No issues overnight.   Objective: Vital signs in last 24 hours: Temp:  [97.8 F (36.6 C)-98.8 F (37.1 C)] 97.8 F (36.6 C) (06/02 0426) Pulse Rate:  [68-79] 69 (06/02 0426) Cardiac Rhythm: Sinus bradycardia;Normal sinus rhythm (06/02 0700) Resp:  [18-19] 19 (06/02 0426) BP: (98-118)/(50-65) 98/50 (06/02 0426) SpO2:  [94 %-100 %] 100 % (06/02 0426) Weight:  [134 kg (295 lb 6.4 oz)] 134 kg (295 lb 6.4 oz) (06/02 0426)     Intake/Output from previous day: 06/01 0701 - 06/02 0700 In: 3 [I.V.:3] Out: -  Intake/Output this shift: No intake/output data recorded.  General appearance: alert, cooperative and no distress Heart: regular rate and rhythm, S1, S2 normal, no murmur, click, rub or gallop Lungs: clear to auscultation bilaterally Abdomen: soft, non-tender; bowel sounds normal; no masses,  no organomegaly Extremities: extremities normal, atraumatic, no cyanosis or edema Wound: sternal incision c/d/i without drainage. Left EVH site slightly opened with a small amount of blood tinged drainage.   Lab Results:  Recent Labs  05/21/17 0229  WBC 11.0*  HGB 9.1*  HCT 28.5*  PLT 409*   BMET: No results for input(s): NA, K, CL, CO2, GLUCOSE, BUN, CREATININE, CALCIUM in the last 72 hours.  PT/INR: No results for input(s): LABPROT, INR in the last 72 hours. ABG    Component Value Date/Time   PHART 7.347 (L) 05/13/2017 0315   HCO3 23.7 05/13/2017 0315   TCO2 25 05/13/2017 1709   ACIDBASEDEF 1.2 05/13/2017 0315   O2SAT 93.6 05/13/2017 0315   CBG (last 3)   Recent Labs  05/22/17 1646 05/22/17 2057 05/23/17 0608  GLUCAP 90 102* 103*     Assessment/Plan: S/P Procedure(s) (LRB): CORONARY ARTERY BYPASS GRAFTING (CABG) x three , using left inernal mammary artery and left leg     greater saphenous vein harvested endoscopically (N/A) TRANSESOPHAGEAL ECHOCARDIOGRAM (TEE) (N/A)  1. CV- NSR in the 60s. BP had been boarderline low but stable.  2. Pulm- tolerating room air without issue 3. Blood glucose level well controlled 4. Left leg EVH drainage improved with now BID dressing changes. Drainage is mostly serosanguinous.   Plan: Per Dr. Donata ClayVan Trigt observe today and home tomorrow if remains stable. Will have home health for assistance with dressing changes   LOS: 15 days    [name] Hammond 05/23/2017   I have seen and examined the patient and agree with the assessment and plan as outlined.  D/C home  [name] Nailslarence H Neftali Abair, MD 05/23/2017 2:10 PM

## 2017-05-24 LAB — GLUCOSE, CAPILLARY
GLUCOSE-CAPILLARY: 112 mg/dL — AB (ref 65–99)
GLUCOSE-CAPILLARY: 103 mg/dL — AB (ref 65–99)

## 2017-05-24 MED ORDER — OXYCODONE HCL 5 MG PO TABS: 5.0000 mg | ORAL_TABLET | Freq: Four times a day (QID) | ORAL | 0 refills | Status: DC | PRN

## 2017-05-24 MED ORDER — METOPROLOL TARTRATE 25 MG PO TABS: 25.0000 mg | ORAL_TABLET | Freq: Two times a day (BID) | ORAL | 1 refills | Status: DC

## 2017-05-24 MED ORDER — ATORVASTATIN CALCIUM 80 MG PO TABS: 80.0000 mg | ORAL_TABLET | Freq: Every day | ORAL | 1 refills | Status: DC

## 2017-05-24 MED ORDER — ASPIRIN 325 MG PO TBEC: 325.0000 mg | DELAYED_RELEASE_TABLET | Freq: Every day | ORAL | Status: DC

## 2017-05-24 NOTE — Progress Notes (Addendum)
      [address]       [place] [id number]             [phone]      12 Days Post-Op Procedure(s) (LRB): CORONARY ARTERY BYPASS GRAFTING (CABG) x three , using left inernal mammary artery and left leg     greater saphenous vein harvested endoscopically (N/A) TRANSESOPHAGEAL ECHOCARDIOGRAM (TEE) (N/A) Subjective: No issues overnight. Ready for discharge today. Understands the plan for changing his leg dressing twice a day. Home health to assist  Objective: Vital signs in last 24 hours: Temp:  [97.5 F (36.4 C)-97.8 F (36.6 C)] 97.5 F (36.4 C) (06/03 0526) Pulse Rate:  [65-81] 65 (06/03 0526) Cardiac Rhythm: Normal sinus rhythm (06/02 1900) Resp:  [18-20] 18 (06/03 0526) BP: (97-132)/(57-66) 132/66 (06/03 0526) SpO2:  [97 %-100 %] 97 % (06/03 0526) Weight:  [133.3 kg (293 lb 14.4 oz)] 133.3 kg (293 lb 14.4 oz) (06/03 0526)     Intake/Output from previous day: 06/02 0701 - 06/03 0700 In: 843 [P.O.:840; I.V.:3] Out: -  Intake/Output this shift: No intake/output data recorded.  General appearance: alert, cooperative and no distress Heart: regular rate and rhythm, S1, S2 normal, no murmur, click, rub or gallop Lungs: clear to auscultation bilaterally Abdomen: soft, non-tender; bowel sounds normal; no masses,  no organomegaly Extremities: extremities normal, atraumatic, no cyanosis or edema Wound: sternal incision c/d/i. right EVH site c/d/i with ecchymosis and hematoma. left EVH site slighly open drainage mostly clear  Lab Results: No results for input(s): WBC, HGB, HCT, PLT in the last 72 hours. BMET: No results for input(s): NA, K, CL, CO2, GLUCOSE, BUN, CREATININE, CALCIUM in the last 72 hours.  PT/INR: No results for input(s): LABPROT, INR in the last 72 hours. ABG    Component Value Date/Time   PHART 7.347 (L) 05/13/2017 0315   HCO3 23.7 05/13/2017 0315   TCO2 25 05/13/2017 1709   ACIDBASEDEF 1.2 05/13/2017 0315   O2SAT 93.6 05/13/2017 0315     CBG (last 3)   Recent Labs  05/23/17 1635 05/23/17 2107 05/24/17 0533  GLUCAP 84 135* 112*    Assessment/Plan: S/P Procedure(s) (LRB): CORONARY ARTERY BYPASS GRAFTING (CABG) x three , using left inernal mammary artery and left leg     greater saphenous vein harvested endoscopically (N/A) TRANSESOPHAGEAL ECHOCARDIOGRAM (TEE) (N/A)  1. CV- NSR in the 60s. BP improved 2. Pulm- tolerating room air without issue 3. Blood glucose level well controlled 4. Left leg EVH drainage improved with now BID dressing changes. Drainage is mostly serosanguinous.   Plan: home today. Patient has home CPAP with him. HH will be in contact with him regarding dressing changes. Patient received instructions on how to change and when.     LOS: 16 days    Sharlene Doryessa N Conte 05/24/2017  I have seen and examined the patient and agree with the assessment and plan as outlined.  D/C home  Purcell Nailslarence H Tresha Muzio, MD 05/24/2017 9:56 AM

## 2017-05-24 NOTE — Progress Notes (Signed)
Order received to discharge patient.  Telemetry monitor removed and CCMD notified.  PIV access removed.  Discharge instructions, follow up, medications and instructions for their use were discussed with patient. 

## 2017-05-25 DIAGNOSIS — Z48812 Encounter for surgical aftercare following surgery on the circulatory system: Secondary | ICD-10-CM | POA: Diagnosis not present

## 2017-05-29 ENCOUNTER — Ambulatory Visit (INDEPENDENT_AMBULATORY_CARE_PROVIDER_SITE_OTHER): Payer: BLUE CROSS/BLUE SHIELD | Admitting: Physician Assistant

## 2017-05-29 ENCOUNTER — Encounter: Payer: Self-pay | Admitting: Physician Assistant

## 2017-05-29 VITALS — BP 102/64 | HR 73 | Ht 75.0 in | Wt 294.4 lb

## 2017-05-29 DIAGNOSIS — I10 Essential (primary) hypertension: Secondary | ICD-10-CM

## 2017-05-29 DIAGNOSIS — E119 Type 2 diabetes mellitus without complications: Secondary | ICD-10-CM | POA: Diagnosis not present

## 2017-05-29 DIAGNOSIS — I25810 Atherosclerosis of coronary artery bypass graft(s) without angina pectoris: Secondary | ICD-10-CM

## 2017-05-29 DIAGNOSIS — E039 Hypothyroidism, unspecified: Secondary | ICD-10-CM

## 2017-05-29 NOTE — Patient Instructions (Signed)
Medication Instructions:   No changes  Labwork:   none  Testing/Procedures:  none  Follow-Up:  2-3 months with Dr. Duke Salviaandolph  Any Other Special Instructions Will Be Listed Below (If Applicable).  See your primary care physician. We recommend a recheck on your CBC when you see them next.     If you need a refill on your cardiac medications before your next appointment, please call your pharmacy.

## 2017-05-29 NOTE — Progress Notes (Signed)
Cardiology Office Note    Date:  05/30/2017   ID:  [name] Hammond, DOB Jun 25, 1961, MRN [id number]  PCP:  [name], MD  Cardiologist:  Dr. Duke Salvia  Chief Complaint  Patient presents with  . Chest Pain    c/o post-operative pain (CABG)  . Numbness    chest  . Wound Check    weeping/drainage in leg from graft vein removal    History of Present Illness:  [name] Hammond is a 56 y.o. male with PMH of HTN, tobacco abuse, hypothyroidism, diabetes, and recently diagnosed CAD s/p CABG. she was evaluated by Dr. Duke Salvia on 04/13/2017 for shortness of breath and chest pain. Outpatient ETT obtained on 5/80/2018 was severely abnormal with greater than 2 mm ST depression in the inferior lead and also V4 to V6 with exercise. He underwent cardiac catheterization on 05/08/2017, this showed 60% ostial left circumflex lesion, 90% ostial LAD lesion, 50% left main lesion, 65% EF. He was referred to cardiothoracic surgery. Echocardiogram obtained on 05/09/2017 showed EF 60-65%. He was evaluated by Dr. Donata Clay and eventually underwent CABG on 05/10/2017 with LIMA to LAD, SVG to diagonal, SVG to OM. He was extubated in the evening of the surgery. He did have some drainage of hematoma from the left leg vein harvesting site, otherwise no significant postoperative complications.  He presents today for post hospital office visit. EKG obtained in the office showed normal sinus rhythm without significant ischemic changes. He continued to have clear drainage from his left leg vein harvesting site. His wife is changing his dressing. He has what appears to be a resolving hematoma at the right leg vein harvesting site. His sternal wound looks fine. Although he does have a nonhealing puncture wound in his abdomen. I gave him some Steri-Strip. He understands he will need to seek medical attention if he has any redness spreading out from the wound or fever/chill. He has some numbness in the chest but no obvious chest pain.  Otherwise he does not have any heart failure symptom on physical exam.   Past Medical History:  Diagnosis Date  . Anxiety   . Diabetes mellitus without complication (HCC)   . Hypertension   . Hypothyroid   . Obstructive sleep apnea   . Pure hypercholesterolemia   . PVD (peripheral vascular disease) (HCC)   . Testicular hypofunction   . Vitamin D deficiency     Past Surgical History:  Procedure Laterality Date  . CORONARY ARTERY BYPASS GRAFT N/A 05/12/2017   Procedure: CORONARY ARTERY BYPASS GRAFTING (CABG) x three , using left inernal mammary artery and left leg     greater saphenous vein harvested endoscopically;  Surgeon: Kerin Perna, MD;  Location: [facility] OR;  Service: Open Heart Surgery;  Laterality: N/A;  . COSMETIC SURGERY    . INTRAVASCULAR ULTRASOUND/IVUS N/A 05/08/2017   Procedure: Intravascular Ultrasound/IVUS;  Surgeon: Kathleene Hazel, MD;  Location: [facility] INVASIVE CV LAB;  Service: Cardiovascular;  Laterality: N/A;  . LEFT HEART CATH AND CORONARY ANGIOGRAPHY N/A 05/08/2017   Procedure: Left Heart Cath and Coronary Angiography;  Surgeon: Kathleene Hazel, MD;  Location: [facility] INVASIVE CV LAB;  Service: Cardiovascular;  Laterality: N/A;  . TEE WITHOUT CARDIOVERSION N/A 05/12/2017   Procedure: TRANSESOPHAGEAL ECHOCARDIOGRAM (TEE);  Surgeon: Donata Clay, Theron Arista, MD;  Location: [facility] OR;  Service: Open Heart Surgery;  Laterality: N/A;    Current Medications: Outpatient Medications Prior to Visit  Medication Sig Dispense Refill  . aspirin 325 MG EC tablet Take 1  tablet (325 mg total) by mouth daily.    Marland Kitchen. atorvastatin (LIPITOR) 80 MG tablet Take 1 tablet (80 mg total) by mouth at bedtime. 30 tablet 1  . calcium carbonate (TUMS - DOSED IN MG ELEMENTAL CALCIUM) 500 MG chewable tablet Chew 1-2 tablets by mouth at bedtime as needed for indigestion or heartburn.    . levothyroxine (SYNTHROID, LEVOTHROID) 100 MCG tablet Take 100 mcg by mouth daily before breakfast.    .  lisinopril-hydrochlorothiazide (PRINZIDE,ZESTORETIC) 20-12.5 MG tablet Take 1 tablet by mouth daily at 12 noon.     . metoprolol tartrate (LOPRESSOR) 25 MG tablet Take 1 tablet (25 mg total) by mouth 2 (two) times daily. 60 tablet 1  . oxyCODONE (OXY IR/ROXICODONE) 5 MG immediate release tablet Take 1 tablet (5 mg total) by mouth every 6 (six) hours as needed for severe pain. 30 tablet 0  . Saxagliptin-Metformin (KOMBIGLYZE XR) 04-999 MG TB24 Take 1 tablet by mouth daily with breakfast.     . sertraline (ZOLOFT) 50 MG tablet Take 50 mg by mouth every evening.      No facility-administered medications prior to visit.      Allergies:   No known allergies   Social History   Social History  . Marital status: Married    Spouse name: N/A  . Number of children: N/A  . Years of education: N/A   Social History Main Topics  . Smoking status: Never Smoker  . Smokeless tobacco: Never Used  . Alcohol use No  . Drug use: No  . Sexual activity: Yes   Other Topics Concern  . None   Social History Narrative  . None     Family History:  The patient's family history includes Cancer - Other in his father; Diabetes type II in his brother, brother, and mother; Heart disease in his father; Hypertension in his father; Thyroid disease in his mother and sister.   ROS:   Please see the history of present illness.    ROS All other systems reviewed and are negative.   PHYSICAL EXAM:   VS:  BP 102/64 (BP Location: Left Arm, Patient Position: Sitting, Cuff Size: Large)   Pulse 73   Ht 6\' 3"  (1.905 m)   Wt 294 lb 6.4 oz (133.5 kg)   BMI 36.80 kg/m    GEN: Well nourished, well developed, in no acute distress  HEENT: normal  Neck: no JVD, carotid bruits, or masses Cardiac: RRR; no murmurs, rubs, or gallops,no edema  Respiratory:  clear to auscultation bilaterally, normal work of breathing GI: soft, nontender, nondistended, + BS MS: no deformity or atrophy   Skin: warm and dry, no rash   +L leg  drainage, R leg hematoma. Abdomen site drainage Neuro:  Alert and Oriented x 3, Strength and sensation are intact Psych: euthymic mood, full affect  Wt Readings from Last 3 Encounters:  05/29/17 294 lb 6.4 oz (133.5 kg)  05/24/17 293 lb 14.4 oz (133.3 kg)  04/13/17 (!) 304 lb (137.9 kg)      Studies/Labs Reviewed:   EKG:  EKG is ordered today.  The ekg ordered today demonstrates Normal sinus rhythm without significant ST-T wave changes  Recent Labs: 05/09/2017: ALT 32; TSH 0.982 05/13/2017: Magnesium 2.3 05/15/2017: BUN 13; Creatinine, Ser 0.85; Potassium 3.8; Sodium 136 05/21/2017: Hemoglobin 9.1; Platelets 409   Lipid Panel    Component Value Date/Time   CHOL 120 05/09/2017 0254   TRIG 94 05/09/2017 0254   HDL 35 (L) 05/09/2017 0254  CHOLHDL 3.4 05/09/2017 0254   VLDL 19 05/09/2017 0254   LDLCALC 66 05/09/2017 0254    Additional studies/ records that were reviewed today include:   ETT 04/28/2017 Study Highlights    The patient walked for 6:08 of a Bruce protocol GXT. Peak HR of 146 which is 86% predicted maximal HR.  He had > 2 m ST depression in the inferior leads and V4-V6.  BP response to exercise was hypertensive  Abnormal GXT . Evidence of ischemia at peak exercise .    Cath 05/08/2017 Conclusion     Mid RCA lesion, 10 %stenosed.  Ost LM to LM lesion, 50 %stenosed.  Ost Cx to Prox Cx lesion, 60 %stenosed.  Ost LAD to Prox LAD lesion, 90 %stenosed.  LM lesion, 50 %stenosed.  3rd Mrg lesion, 30 %stenosed.  The left ventricular ejection fraction is greater than 65% by visual estimate.  The left ventricular systolic function is normal.  LV end diastolic pressure is normal.  There is no mitral valve regurgitation.  Ost 1st Diag to 1st Diag lesion, 50 %stenosed.   1. Severe ostial LAD stenosis with moderate stenosis in the distal left main artery and ostial Circumflex artery. This is confirmed with IVUS imaging.  2. Mild plaque in the mid RCA and  third OM branch 3. Moderate stenosis in the first diagonal branch 4. Normal LV systolic function  Recommendations: I do not think PCI of the ostial LAD is a good option given involvement of the distal left main artery and Circumflex artery. I think CABG is the best option for revascularization. I will ask CT surgery to see him to discuss CABG. Continue ASA, statin. Start beta blocker. Will arrange echo.       Echo 05/09/2017 LV EF: 60% -   65%  Study Conclusions  - Left ventricle: The cavity size was normal. Wall thickness was   increased in a pattern of mild LVH. Systolic function was normal.   The estimated ejection fraction was in the range of 60% to 65%.   Wall motion was normal; there were no regional wall motion   abnormalities. Left ventricular diastolic function parameters   were normal. - Tricuspid valve: There was trivial regurgitation. - Pulmonary arteries: Systolic pressure could not be accurately   estimated. - Pericardium, extracardiac: There was no pericardial effusion.  Impressions:  - Mild LVH with LVEF 60-65% and normal diastolic function. Trivial   tricuspid regurgitation.    Echo 05/09/2017 Summary: Bilateral: intimal wall thickening CCA. Mild soft plaque origin ICA. 1-39% ICA plaquing. Vertebral artery flow is antegrade. Prepared and Electronically Authenticated by    TEE 05/12/2017 Result status: Final result   Left ventricle: Normal cavity size. Concentric hypertrophy. LV systolic function is normal with an EF of 55-60%. There are no obvious wall motion abnormalities.  Septum: No Patent Foramen Ovale present.  Left atrium: Patent foramen ovale not present.  Aortic valve: The valve is trileaflet. No stenosis. No regurgitation.  Right ventricle: Normal cavity size and ejection fraction.     ASSESSMENT:    1. Coronary artery disease involving coronary bypass graft of native heart without angina pectoris   2. Essential hypertension   3.  Hypothyroidism, unspecified type   4. Controlled type 2 diabetes mellitus without complication, without long-term current use of insulin (HCC)      PLAN:  In order of problems listed above:  1. CAD s/p CABG: No obvious angina, continue aspirin, metoprolol, lisinopril/hydrochlorothiazide. Does have some numbness in the chest,  however this is different from his usual angina. EKG showed no ischemic changes. Continued to have left leg and abdomen wound drainage, his wife is changing the dressing over his left leg. I gave him some restrictive for the abdominal wound. 2 out of 3 abdominal wound has healed, while one of the abdominal wound is open and has clear drainage. It does not look infected. I gave him some Steri-Strips for it. He understands to seek medical attention if he has redness spreading out from the wound site or fever or chill. He may be able to start cardiac rehabilitation nurses and of this month assuming his wound has fully healed by then.  2. HTN: Blood pressure stable 102/64. Unable to titrate medication.  3. DM II: Continue oral antihyperglycemic medication.  4. Hypothyroidism: On Synthroid.    Medication Adjustments/Labs and Tests Ordered: Current medicines are reviewed at length with the patient today.  Concerns regarding medicines are outlined above.  Medication changes, Labs and Tests ordered today are listed in the Patient Instructions below. Patient Instructions  Medication Instructions:   No changes  Labwork:   none  Testing/Procedures:  none  Follow-Up:  2-3 months with Dr. Duke Salvia  Any Other Special Instructions Will Be Listed Below (If Applicable).  See your primary care physician. We recommend a recheck on your CBC when you see them next.     If you need a refill on your cardiac medications before your next appointment, please call your pharmacy.      Ramond Dial, [place]  05/30/2017 1:39 PM    [facility] [address] [place], [place], [place]  [address] Phone: [phone]; Fax: [phone]

## 2017-05-30 ENCOUNTER — Encounter: Payer: Self-pay | Admitting: Physician Assistant

## 2017-06-01 ENCOUNTER — Telehealth: Payer: Self-pay

## 2017-06-01 NOTE — Telephone Encounter (Signed)
I don't see where Dr.Dohmeier has ordered a cpap for this pt. She has only ordered a titration study. [facility]

## 2017-06-01 NOTE — Telephone Encounter (Signed)
-----   Message from Cecilie KicksRobin H Hyler sent at 06/01/2017 10:26 AM EDT ----- I called patient to schedule his cpap titration. His wife said he was set up on cpap by [facility]. They instructed him on machine. I didn't see where she was giving him an Auto. Do you know anything?

## 2017-06-01 NOTE — Telephone Encounter (Signed)
[facility]   "Ellwood HandlerHey Shawne Bulow,  Yes we did just set him up on 05/19/17. It was ordered by Dr. Kathlee NationsPeter Van Trigt III [facility].   Is there any other information I can assist with?"

## 2017-06-02 NOTE — Telephone Encounter (Signed)
I will call and get a follow-up appointment scheduled and put in encounter

## 2017-06-02 NOTE — Telephone Encounter (Signed)
No follow up visit with Dr. Vickey Hugerohmeier is scheduled for this pt.

## 2017-06-02 NOTE — Telephone Encounter (Signed)
He is on Auto so I will cancel cpap titration for now. Does he have follow-up visit scheduled yet?

## 2017-06-03 NOTE — Telephone Encounter (Signed)
Spoke with Corrie DandyMary from [facility]. She reviewed discharge orders and Dr. Maudie Flakesrigt ordered cpap based on his sleep study we did. Not sure how to handle this. Will send this encounter to Dr. Vickey Hugerohmeier for advice.

## 2017-06-03 NOTE — Addendum Note (Signed)
Addended by: Beryle QuantBENSHINE, Marrell Dicaprio L on: 06/03/2017 01:29 PM   Modules accepted: Orders

## 2017-06-12 ENCOUNTER — Other Ambulatory Visit: Payer: Self-pay | Admitting: Cardiothoracic Surgery

## 2017-06-12 DIAGNOSIS — Z951 Presence of aortocoronary bypass graft: Secondary | ICD-10-CM

## 2017-06-15 ENCOUNTER — Encounter: Payer: Self-pay | Admitting: Physician Assistant

## 2017-06-15 ENCOUNTER — Ambulatory Visit
Admission: RE | Admit: 2017-06-15 | Discharge: 2017-06-15 | Disposition: A | Discharge status: Home / Self Care | Payer: BLUE CROSS/BLUE SHIELD | Source: Ambulatory Visit | Attending: Cardiothoracic Surgery | Admitting: Cardiothoracic Surgery

## 2017-06-15 ENCOUNTER — Ambulatory Visit (INDEPENDENT_AMBULATORY_CARE_PROVIDER_SITE_OTHER): Payer: Self-pay | Admitting: Physician Assistant

## 2017-06-15 VITALS — BP 104/60 | HR 56 | Resp 20 | Ht 75.0 in | Wt 293.0 lb

## 2017-06-15 DIAGNOSIS — Z951 Presence of aortocoronary bypass graft: Secondary | ICD-10-CM

## 2017-06-15 NOTE — Patient Instructions (Signed)
Please decrease your Lopressor to 12.5mg  twice a day  Please pack wound wet to dry once a day, wound care nurse to help.  Please follow-up in 2 weeks for a wound check of the left EVH site.   Please call our office if there are changes in your medical condition. If you develop fever, chills, night sweats, redness around the incision or any other signs of infection.  You may return to driving an automobile as long as you are no longer requiring oral narcotic pain relievers during the daytime.  It would be wise to start driving only short distances during the daylight and gradually increase from there as you feel comfortable.  Endocarditis is a potentially serious infection of heart valves or inside lining of the heart.  It occurs more commonly in patients with diseased heart valves (such as patient's with aortic or mitral valve disease) and in patients who have undergone heart valve repair or replacement.  Certain surgical and dental procedures may put you at risk, such as dental cleaning, other dental procedures, or any surgery involving the respiratory, urinary, gastrointestinal tract, gallbladder or prostate gland.   To minimize your chances for develooping endocarditis, maintain good oral health and seek prompt medical attention for any infections involving the mouth, teeth, gums, skin or urinary tract.    Always notify your doctor or dentist about your underlying heart valve condition before having any invasive procedures. You will need to take antibiotics before certain procedures, including all routine dental cleanings or other dental procedures.  Your cardiologist or dentist should prescribe these antibiotics for you to be taken ahead of time.  Make every effort to stay physically active, get some type of exercise on a regular basis, and stick to a "heart healthy diet".  The long term benefits for regular exercise and a healthy diet are critically important to your overall health and  wellbeing.

## 2017-06-15 NOTE — Progress Notes (Signed)
[address]       [place] [address]             [phone]      Jarryd Gratz is a 56 y.o. male patient s/p CABG x 3 on 05/12/2017.  Past Medical History:  Diagnosis Date  . Anxiety   . Diabetes mellitus without complication (HCC)   . Hypertension   . Hypothyroid   . Obstructive sleep apnea   . Pure hypercholesterolemia   . PVD (peripheral vascular disease) (HCC)   . Testicular hypofunction   . Vitamin D deficiency    No past surgical history pertinent negatives on file.    Scheduled Meds: Current Outpatient Prescriptions on File Prior to Visit  Medication Sig Dispense Refill  . aspirin 325 MG EC tablet Take 1 tablet (325 mg total) by mouth daily.    Marland Kitchen atorvastatin (LIPITOR) 80 MG tablet Take 1 tablet (80 mg total) by mouth at bedtime. 30 tablet 1  . calcium carbonate (TUMS - DOSED IN MG ELEMENTAL CALCIUM) 500 MG chewable tablet Chew 1-2 tablets by mouth at bedtime as needed for indigestion or heartburn.    . levothyroxine (SYNTHROID, LEVOTHROID) 100 MCG tablet Take 100 mcg by mouth daily before breakfast.    . lisinopril-hydrochlorothiazide (PRINZIDE,ZESTORETIC) 20-12.5 MG tablet Take 1 tablet by mouth daily at 12 noon.     . metoprolol tartrate (LOPRESSOR) 25 MG tablet Take 1 tablet (25 mg total) by mouth 2 (two) times daily. 60 tablet 1  . oxyCODONE (OXY IR/ROXICODONE) 5 MG immediate release tablet Take 1 tablet (5 mg total) by mouth every 6 (six) hours as needed for severe pain. 30 tablet 0  . Saxagliptin-Metformin (KOMBIGLYZE XR) 04-999 MG TB24 Take 1 tablet by mouth daily with breakfast.     . sertraline (ZOLOFT) 50 MG tablet Take 50 mg by mouth every evening.      No current facility-administered medications on file prior to visit.     Allergies  Allergen Reactions  . No Known Allergies     Blood pressure 104/60, pulse (!) 56, resp. rate 20, height 6\' 3"  (1.905 m), weight 132.9 kg (293 lb), SpO2 98 %.  Subjective:  Overall Mr. Kawano has  been doing very well postoperatively. He did have a complication in the hospital where his left [facility] site developed a hematoma and clot. He required dressing changes several times a day and was held in the hospital for a few extra days until this resolved. Home health nursing has been visiting to assist in dressing changes. He has stopped his pain medication during the day and takes one oxycodone before bed. He has been getting around okay at home.  Objective: Cor: sinus bradycardia, no murmur Pulm: CTA bilaterally Abd: soft, nontender Ext: No edema Wound: sternal sound healing well without drainage or erythema, Right EVH site healing well, left EVH site with pinhole sized opening, it does track 1 inch superior and 1 inch medially. I packed today with wet to dry dressing. No erythema and clear drainage.   CXR reviewed: CLINICAL DATA:  Status post CABG on May 12, 2017. No current complaints.  EXAM: CHEST  2 VIEW  COMPARISON:  Chest x-ray ease of May 15, 2017  FINDINGS: The lungs are well-expanded. There is no focal infiltrate. There is no pleural effusion. The heart is mildly enlarged. Its margins are distinct. There is no pulmonary vascular congestion. The mediastinum is normal in width. The sternal wires are intact.  There is a small pleural effusion blunting the left posterior costophrenic angle. There is no pneumothorax.  IMPRESSION: Residual small left pleural effusion. No pulmonary edema. Further decrease in the size of the cardiac silhouette.   Electronically Signed   By: David  SwazilandJordan M.D.   On: 06/15/2017 13:15     Assessment & Plan  Mr. Marlan PalauJeffrey Tavella is a 56 year old male patient who recently underwent a coronary bypass grafting 3 on 05/12/2017. Overall he is doing quite well. He still has home health nursing coming to visit for dressing changes. He now only has a pinhole incision over his EVH site with no blood but clear drainage. His wound does track about an inch  superior and an inch towards the knee. Since a pocket remains I did pack his incision with a wet 2 x 2 gauze dressing and dry 4 x 4 gauze over top. We covered the area with tape and added an ABVD dressing with an Ace bandage for extra support. Per his wife they have been changing the dressing every other day. I asked for them to resume daily dressing changes. I do not think that the leg is infected. The patient has not had any fevers chills and area is not erythematous. The drainage is clear. I encouraged both him and his wife to call if there are questions. Home health nursing will be out to their house tomorrow to assist with dressing changes. He has a primary care appointment next Monday where he plans to go over his recent blood glucose levels. He states that his blood glucose level has been right around 100 when he takes it at home. His heart rate was in the 50s today and his blood pressure was on the low side therefore I changed his Lopressor from 25 mg to 12.5 mg twice a day. I wrote the instructions on his discharge paperwork and he understands. I encouraged him to continue checking his blood pressure daily at home. He has yet to follow up with a cardiologist. He asked to hold off on cardiac rehabilitation referral until I see him in 2 weeks. He is unsure if he would like to purchase. The program at this time. He shares he's been having some dental pain and I encouraged him to see a dentist as soon as possible. I explained to him that his dentist may require pre-and post antibiotics due to his recent heart surgery. He had no further questions at this time. I encouraged him to reach out to our office if he has any signs of infection or any questions. He is to follow-up in 2 weeks for another wound check.   Sharlene Doryessa N Conte 06/15/2017

## 2017-06-16 NOTE — Telephone Encounter (Signed)
This patient has had a diagnostic polysomnography with [name] Hammond, then was seen in the hospital were Dr. Theron AristaPeter v. trigt ordered the CPAP upon his discharge. If he was set up with the CPAP based on his cardiac surgeons orders, I would usually not follow-up but the ordering physician . I would like to see him in a follow-up appointment so that he can handle his CPAP from here on out, otherwise he will need to go to the referring ordering physician.  Make follow up appointment for me or NP> CD as soon as possible. CD

## 2017-06-17 NOTE — Telephone Encounter (Signed)
Pt returned call, please call back he is at dentist and they want to know If pt can be given Rx for Amoxicillin

## 2017-06-17 NOTE — Telephone Encounter (Signed)
I called pt back. I explained to him that usually for dental procedures, cardiology and PCP are usually deferred to regarding amoxicillin RX. Pt said he will speak to his cardiologist and PCP regarding this.  I also advised him that usually hospital MDs don't start cpaps for pt's that we did the sleep study for. If pt wants to be followed by [facility] for his cpap, he will need an appt. Pt is agreeable to this an appt was scheduled with [name] Jonesarolyn, NP for 09/03/17 at 1:15pm. Pt verbalized understanding of this appt date and time and to bring his cpap. Pt asked for a letter reminding him of his appt. I verified with the pt that the address we have on file is correct.

## 2017-06-17 NOTE — Telephone Encounter (Signed)
I called pt to discuss. No answer, left a message asking him to call me back. 

## 2017-06-29 ENCOUNTER — Ambulatory Visit: Payer: Self-pay

## 2017-06-30 ENCOUNTER — Ambulatory Visit (INDEPENDENT_AMBULATORY_CARE_PROVIDER_SITE_OTHER): Payer: Self-pay | Admitting: Physician Assistant

## 2017-06-30 VITALS — BP 107/68 | HR 65 | Resp 20 | Ht 75.0 in | Wt 290.0 lb

## 2017-06-30 DIAGNOSIS — Z951 Presence of aortocoronary bypass graft: Secondary | ICD-10-CM

## 2017-06-30 MED ORDER — METOPROLOL TARTRATE 25 MG PO TABS: 12.5000 mg | ORAL_TABLET | Freq: Two times a day (BID) | ORAL | 1 refills | Status: DC

## 2017-06-30 NOTE — Patient Instructions (Signed)
He will see us again in 2 weeks for a wound check.  I asked them to continue to daily packing until the wound is completely closed then they may change the dressing every other day. Continue to cover the wound as long as it is draining.  Tylenol for pain

## 2017-06-30 NOTE — Progress Notes (Signed)
[name] Hammond is a 56 y.o. male patient who presents for a wound check.    1. S/P CABG x 3    Past Medical History:  Diagnosis Date  . Anxiety   . Diabetes mellitus without complication (HCC)   . Hypertension   . Hypothyroid   . Obstructive sleep apnea   . Pure hypercholesterolemia   . PVD (peripheral vascular disease) (HCC)   . Testicular hypofunction   . Vitamin D deficiency    No past surgical history pertinent negatives on file.   Scheduled Meds: Current Outpatient Prescriptions on File Prior to Visit  Medication Sig Dispense Refill  . aspirin 325 MG EC tablet Take 1 tablet (325 mg total) by mouth daily.    Marland Kitchen. atorvastatin (LIPITOR) 80 MG tablet Take 1 tablet (80 mg total) by mouth at bedtime. 30 tablet 1  . calcium carbonate (TUMS - DOSED IN MG ELEMENTAL CALCIUM) 500 MG chewable tablet Chew 1-2 tablets by mouth at bedtime as needed for indigestion or heartburn.    . levothyroxine (SYNTHROID, LEVOTHROID) 100 MCG tablet Take 100 mcg by mouth daily before breakfast.    . lisinopril-hydrochlorothiazide (PRINZIDE,ZESTORETIC) 20-12.5 MG tablet Take 1 tablet by mouth daily at 12 noon.     . metoprolol tartrate (LOPRESSOR) 25 MG tablet Take 1 tablet (25 mg total) by mouth 2 (two) times daily. (Patient taking differently: Take 12.5 mg by mouth 2 (two) times daily. ) 60 tablet 1  . Saxagliptin-Metformin (KOMBIGLYZE XR) 04-999 MG TB24 Take 1 tablet by mouth daily with breakfast.     . sertraline (ZOLOFT) 50 MG tablet Take 50 mg by mouth every evening.      No current facility-administered medications on file prior to visit.     Allergies  Allergen Reactions  . No Known Allergies     Blood pressure 107/68, pulse 65, resp. rate 20, height 6\' 3"  (1.905 m), weight 131.5 kg (290 lb), SpO2 97 %.  Subjective: [name] Hammond presents for a wound check today. He is s/p CABG x 3 on 05/12/2017.   Objective: Cor: RRR, no murmur Pulm: CTA bilaterally ZOX:WRUEAbd:soft, non tender Ext: no edema Wound:  left leg EVH wound is healing nicely. There is a small opening that tracks 1/2 cm anteriorly and 1/2 cm medially which is markedly improved. He continued to pack the incision daily.   Assessment & Plan  He is doing much better. I asked them to continue to daily packing until the wound is completely closed then they may change the dressing every other day. Continue to cover the wound as long as it is draining. I asked them to return in 2 weeks for one last appointment. At this time I expect that he will no longer have any drainage and will not need to cover the incision. It should also be closed at this time. He has had no further issues. I asked him to clean his incision with soap and water daily. If he wants to change the dressing and packing after his shower daily this will likely work best. He doe have a small flattened hematoma which will likely absorb overtime. He continued to wrap his leg with an ACE bandage as needed for comfort but it is no longer a necessity. I gave him another prescription for his Lopressor today since he was out. I think he is doing much better on a lower dose. We discussed not using NSAIDS in the post-op period but just using Tylenol for pain at this point. He  said he had some dental pain and he went to the dentist for an evaluation. It eventually went away but he is to return there and may need work performed. They want to give antibiotics if any procedures are done which is okay with [place]. I asked the patient to have them call us if they are unable to reach cardiology which has been the case in the past. He is hopeful since the pain went away that he will not require a procedure. He will see [place] again in 2 weeks for a wound check.  [name] Hammond 06/30/2017

## 2017-06-30 NOTE — Addendum Note (Signed)
Addended by: Rowland LatheWRIGHT, SUSAN R on: 06/30/2017 03:23 PM   Modules accepted: Orders

## 2017-07-13 ENCOUNTER — Ambulatory Visit (INDEPENDENT_AMBULATORY_CARE_PROVIDER_SITE_OTHER): Payer: Self-pay | Admitting: Physician Assistant

## 2017-07-13 VITALS — BP 106/66 | HR 61 | Resp 16 | Ht 75.0 in | Wt 292.4 lb

## 2017-07-13 DIAGNOSIS — Z951 Presence of aortocoronary bypass graft: Secondary | ICD-10-CM

## 2017-07-13 NOTE — Patient Instructions (Signed)
Follow-up with our practice as needed. [facility] is welcome to call our office with questions or concerns. Follow-up with cardiology, Dr. Duke Salviaandolph for blood pressure control in medication titration. Follow-up with primary care for diabetes mellitus management.  Referral [facility] provided today.

## 2017-07-13 NOTE — Progress Notes (Signed)
[address]       [name] [id number]             [phone]      [name] Hammond is a 56 y.o. male patient [facility]  1. S/P CABG x 3    Past Medical History:  Diagnosis Date  . Anxiety   . Diabetes mellitus without complication (HCC)   . Hypertension   . Hypothyroid   . Obstructive sleep apnea   . Pure hypercholesterolemia   . PVD (peripheral vascular disease) (HCC)   . Testicular hypofunction   . Vitamin D deficiency    Scheduled Meds: Current Outpatient Prescriptions on File Prior to Visit  Medication Sig Dispense Refill  . aspirin 325 MG EC tablet Take 1 tablet (325 mg total) by mouth daily.    Marland Kitchen. atorvastatin (LIPITOR) 80 MG tablet Take 1 tablet (80 mg total) by mouth at bedtime. 30 tablet 1  . levothyroxine (SYNTHROID, LEVOTHROID) 100 MCG tablet Take 100 mcg by mouth daily before breakfast.    . lisinopril-hydrochlorothiazide (PRINZIDE,ZESTORETIC) 20-12.5 MG tablet Take 1 tablet by mouth daily at 12 noon.     . metoprolol tartrate (LOPRESSOR) 25 MG tablet Take 0.5 tablets (12.5 mg total) by mouth 2 (two) times daily. 60 tablet 1  . Saxagliptin-Metformin (KOMBIGLYZE XR) 04-999 MG TB24 Take 1 tablet by mouth daily with breakfast.     . sertraline (ZOLOFT) 50 MG tablet Take 50 mg by mouth every evening.      No current facility-administered medications on file prior to visit.     Allergies  Allergen Reactions  . No Known Allergies     Blood pressure 106/66, pulse 61, resp. rate 16, height 6\' 3"  (1.905 m), weight 132.6 kg (292 lb 6.4 oz), SpO2 97 %.  Subjective: Here for a wound check of his left EVH site.   Objective: Cor: Regular rate and rhythm, no murmur Pulm: Clear to auscultation bilaterally in all fields Abd: Soft, nontender Wound: Sternal incision has a keloid, no drainage, well approximated. Left EVH site is completely closed, no drainage, there is a small amount of eschar present. Right EVH site is  well approximated, no drainage, healing well. Ext: No edema    Assessment & Plan: [name] presents today for his final EVH wound check. Today his left EVH site is well approximated without any drainage. There is no tracking present in the wound is completely closed. He does have a flat hardened hematoma below the incision. He has been slowly increasing his activity level at home as tolerated. He asked today about going to [employer] and walking on the treadmill. I stated this would be fine in addition to a stationary bike. No elliptical at this time. I did provide [facility] and [name] plans to attend.  Will need follow-up with Dr. Duke Salviaandolph for further medication titration for hypertension. Currently well controlled on Lopressor 12.5mg  and lisinopril/HCTZ 20-12.5mg . he plans to increase his activity level and try to lose some weight. I stated that his only limitations currently are with his upper body and he may do as much walking as he can tolerate as weather permits. I stated he can continue to wrap his leg with an Ace bandage if it is sore swollen. He states his blood glucose level has been running anywhere from 90-110 usually. There have been a few high outliers but  none higher than 150. He did have one hypoglycemic incident early in the morning where his blood glucose level was in the 60s. This was quickly rectified. He is encouraged to follow up with his primary care for his diabetes management. He can follow-up with our practice as needed. He is always welcome to call our office if questions or concerns arise.  [name] 07/13/2017

## 2017-07-15 ENCOUNTER — Telehealth (HOSPITAL_COMMUNITY): Payer: Self-pay

## 2017-07-15 NOTE — Telephone Encounter (Signed)
I called and left message on patient voicemail to call office about scheduling for cardiac rehab. I left office contact information on voicemail to return call.  ° °

## 2017-07-16 ENCOUNTER — Encounter (HOSPITAL_COMMUNITY): Payer: Self-pay

## 2017-07-20 ENCOUNTER — Telehealth (HOSPITAL_COMMUNITY): Payer: Self-pay

## 2017-07-20 ENCOUNTER — Telehealth (HOSPITAL_COMMUNITY): Payer: Self-pay | Admitting: Pharmacist

## 2017-07-20 NOTE — Telephone Encounter (Signed)
Disregard this Encounter. Entered telephone encounter in errorb

## 2017-07-20 NOTE — Telephone Encounter (Signed)
*  Updated insurance verification *  [employer] - no co-payment, deductible $1200/$1200 has been met, out of pocket $3200/$3200 has been met, 20% co-insurance, no pre-authorization and no limit on visit. Passport/reference # [phone].

## 2017-07-21 ENCOUNTER — Encounter (HOSPITAL_COMMUNITY)
Admission: RE | Admit: 2017-07-21 | Discharge: 2017-07-21 | Disposition: A | Discharge status: Home / Self Care | Payer: BLUE CROSS/BLUE SHIELD | Source: Ambulatory Visit | Attending: Cardiovascular Disease | Admitting: Cardiovascular Disease

## 2017-07-21 VITALS — BP 122/64 | HR 63 | Ht 74.0 in | Wt 294.8 lb

## 2017-07-21 DIAGNOSIS — Z951 Presence of aortocoronary bypass graft: Secondary | ICD-10-CM

## 2017-07-21 DIAGNOSIS — Z48812 Encounter for surgical aftercare following surgery on the circulatory system: Secondary | ICD-10-CM | POA: Insufficient documentation

## 2017-07-21 NOTE — Progress Notes (Signed)
[name] Hammond 56 y.o. male DOB: 10/08/1961 MRN: [id number]      Nutrition Note  S/p CABG x 3  Past Medical History:  Diagnosis Date  . Anxiety   . Diabetes mellitus without complication (HCC)   . Hypertension   . Hypothyroid   . Obstructive sleep apnea   . Pure hypercholesterolemia   . PVD (peripheral vascular disease) (HCC)   . Testicular hypofunction   . Vitamin D deficiency    Meds reviewed. Saxagliptin-Metformin noted  HT: Ht Readings from Last 1 Encounters:  07/13/17 6\' 3"  (1.905 m)    WT: Wt Readings from Last 3 Encounters:  07/13/17 292 lb 6.4 oz (132.6 kg)  06/30/17 290 lb (131.5 kg)  06/15/17 293 lb (132.9 kg)     BMI 36.5   Current tobacco use? No  Labs:  Lipid Panel     Component Value Date/Time   CHOL 120 05/09/2017 0254   TRIG 94 05/09/2017 0254   HDL 35 (L) 05/09/2017 0254   CHOLHDL 3.4 05/09/2017 0254   VLDL 19 05/09/2017 0254   LDLCALC 66 05/09/2017 0254    Lab Results  Component Value Date   HGBA1C 6.6 (H) 05/09/2017   CBG (last 3)  No results for input(s): GLUCAP in the last 72 hours.  Nutrition Note Spoke with pt. Nutrition plan and goals reviewed with pt. Pt is following Step 2 of the Therapeutic Lifestyle Changes diet. Pt had several nutrition questions, which were clarified by this Clinical research associatewriter. Pt wants to lose wt. Wt loss tips reviewed. Pt is diabetic. Last A1c indicates blood glucose well-controlled. Pt aware of nutrition education classes offered. Pt expressed understanding of the information reviewed via feed-back method.  Nutrition Diagnosis ? Food-and nutrition-related knowledge deficit related to lack of exposure to information as related to diagnosis of: ? CVD ? DM ? Obesity related to excessive energy intake as evidenced by a BMI of 36.5  Nutrition Intervention ? Pt's individual nutrition plan and goals reviewed with pt. ? Pt given handouts for: ? Nutrition I class ? Nutrition II class ? Diabetes Blitz Class   Nutrition Goal(s):   Pt to identify food quantities necessary to achieve weight loss of 6-24 lb (2.7-10.9 kg) at graduation from cardiac rehab.  Plan:  Pt to attend nutrition classes ? Portion Distortion ? Diabetes Q & A Will provide client-centered nutrition education as part of interdisciplinary care.   Monitor and evaluate progress toward nutrition goal with team.  Mickle PlumbEdna Aizik Reh, M.Ed, RD, LDN, CDE 07/21/2017 2:13 PM

## 2017-07-23 ENCOUNTER — Encounter (HOSPITAL_COMMUNITY): Payer: Self-pay

## 2017-07-23 NOTE — Progress Notes (Signed)
Cardiac Individual Treatment Plan  Patient Details  Name: [name] Hammond MRN: [id number] Date of Birth: 05/18/1961 Referring Provider:     CARDIAC REHAB PHASE II ORIENTATION from 07/21/2017 in [facility] CARDIAC REHAB  Referring Provider  Chilton Si MD      Initial Encounter Date:    CARDIAC REHAB PHASE II ORIENTATION from 07/21/2017 in [facility] CARDIAC REHAB  Date  07/21/17  Referring Provider  Chilton Si MD      Visit Diagnosis: 05/13/17 S/P CABG (coronary artery bypass graft)  Patient's Home Medications on Admission:  Current Outpatient Prescriptions:  .  aspirin 325 MG EC tablet, Take 1 tablet (325 mg total) by mouth daily., Disp: , Rfl:  .  atorvastatin (LIPITOR) 80 MG tablet, Take 1 tablet (80 mg total) by mouth at bedtime., Disp: 30 tablet, Rfl: 1 .  levothyroxine (SYNTHROID, LEVOTHROID) 100 MCG tablet, Take 100 mcg by mouth daily before breakfast., Disp: , Rfl:  .  lisinopril-hydrochlorothiazide (PRINZIDE,ZESTORETIC) 20-12.5 MG tablet, Take 1 tablet by mouth daily at 12 noon. , Disp: , Rfl:  .  metoprolol tartrate (LOPRESSOR) 25 MG tablet, Take 0.5 tablets (12.5 mg total) by mouth 2 (two) times daily., Disp: 60 tablet, Rfl: 1 .  Saxagliptin-Metformin (KOMBIGLYZE XR) 04-999 MG TB24, Take 1 tablet by mouth daily with breakfast. , Disp: , Rfl:  .  sertraline (ZOLOFT) 50 MG tablet, Take 50 mg by mouth every evening. , Disp: , Rfl:   Past Medical History: Past Medical History:  Diagnosis Date  . Anxiety   . Diabetes mellitus without complication (HCC)   . Hypertension   . Hypothyroid   . Obstructive sleep apnea   . Pure hypercholesterolemia   . PVD (peripheral vascular disease) (HCC)   . Testicular hypofunction   . Vitamin D deficiency     Tobacco Use: History  Smoking Status  . Never Smoker  Smokeless Tobacco  . Never Used    Labs: Recent Review Flowsheet Data    Labs for ITP Cardiac and Pulmonary Rehab Latest  Ref Rng & Units 05/12/2017 05/12/2017 05/12/2017 05/12/2017 05/13/2017   Cholestrol 0 - 200 mg/dL - - - - -   LDLCALC 0 - 99 mg/dL - - - - -   HDL >40 mg/dL - - - - -   Trlycerides <150 mg/dL - - - - -   Hemoglobin A1c 4.8 - 5.6 % - - - - -   PHART 7.350 - 7.450 7.306(L) 7.310(L) - 7.319(L) 7.347(L)   PCO2ART 32.0 - 48.0 mmHg 46.2 47.7 - 44.5 44.4   HCO3 20.0 - 28.0 mmol/L 23.1 24.0 - 22.8 23.7   TCO2 0 - 100 mmol/L 25 25 25 24 25    ACIDBASEDEF 0.0 - 2.0 mmol/L 3.0(H) 2.0 - 3.0(H) 1.2   O2SAT % 97.0 99.0 - 95.0 93.6      Capillary Blood Glucose: Lab Results  Component Value Date   GLUCAP 103 (H) 05/24/2017   GLUCAP 112 (H) 05/24/2017   GLUCAP 135 (H) 05/23/2017   GLUCAP 84 05/23/2017   GLUCAP 117 (H) 05/23/2017     Exercise Target Goals: Date: 07/21/17  Exercise Program Goal: Individual exercise prescription set with THRR, safety & activity barriers. Participant demonstrates ability to understand and report RPE using BORG scale, to self-measure pulse accurately, and to acknowledge the importance of the exercise prescription.  Exercise Prescription Goal: Starting with aerobic activity 30 plus minutes a day, 3 days per week for initial exercise prescription. Provide home  exercise prescription and guidelines that participant acknowledges understanding prior to discharge.  Activity Barriers & Risk Stratification:     Activity Barriers & Cardiac Risk Stratification - 07/21/17 1440      Activity Barriers & Cardiac Risk Stratification   Activity Barriers Muscular Weakness;Deconditioning   Cardiac Risk Stratification High      6 Minute Walk:     6 Minute Walk    Row Name 07/21/17 1557         6 Minute Walk   Phase Initial     Distance 1692 feet     Walk Time 6 minutes     # of Rest Breaks 0     MPH 3.2     METS 4.08     RPE 13     VO2 Peak 14.29     Resting HR 71 bpm     Resting BP 122/64     Max Ex. HR 128 bpm     Max Ex. BP 140/70     2 Minute Post BP 114/82         Oxygen Initial Assessment:   Oxygen Re-Evaluation:   Oxygen Discharge (Final Oxygen Re-Evaluation):   Initial Exercise Prescription:     Initial Exercise Prescription - 07/21/17 1600      Date of Initial Exercise RX and Referring Provider   Date 07/21/17   Referring Provider Chilton Si MD     Treadmill   MPH 3   Grade 0   Minutes 10   METs 3.3     Bike   Level 1   Minutes 10   METs 2.4     NuStep   Level 3   SPM 80   Minutes 10   METs 2     Prescription Details   Frequency (times per week) 3   Duration Progress to 30 minutes of continuous aerobic without signs/symptoms of physical distress     Intensity   THRR 40-80% of Max Heartrate 66-132   Ratings of Perceived Exertion 11-13   Perceived Dyspnea 0-4     Progression   Progression Continue progressive overload as per policy without signs/symptoms or physical distress.     Resistance Training   Training Prescription Yes   Weight 3lbs   Reps 10-15      Perform Capillary Blood Glucose checks as needed.  Exercise Prescription Changes:   Exercise Comments:   Exercise Goals and Review:     Exercise Goals    Row Name 07/21/17 1440             Exercise Goals   Increase Physical Activity Yes       Intervention Provide advice, education, support and counseling about physical activity/exercise needs.;Develop an individualized exercise prescription for aerobic and resistive training based on initial evaluation findings, risk stratification, comorbidities and participant's personal goals.       Expected Outcomes Achievement of increased cardiorespiratory fitness and enhanced flexibility, muscular endurance and strength shown through measurements of functional capacity and personal statement of participant.       Increase Strength and Stamina Yes       Intervention Provide advice, education, support and counseling about physical activity/exercise needs.;Develop an individualized exercise  prescription for aerobic and resistive training based on initial evaluation findings, risk stratification, comorbidities and participant's personal goals.       Expected Outcomes Achievement of increased cardiorespiratory fitness and enhanced flexibility, muscular endurance and strength shown through measurements of functional capacity and personal statement of participant.  Exercise Goals Re-Evaluation :    Discharge Exercise Prescription (Final Exercise Prescription Changes):   Nutrition:  Target Goals: Understanding of nutrition guidelines, daily intake of sodium 1500mg , cholesterol 200mg , calories 30% from fat and 7% or less from saturated fats, daily to have 5 or more servings of fruits and vegetables.  Biometrics:     Pre Biometrics - 07/21/17 1555      Pre Biometrics   Waist Circumference 44.75 inches   Hip Circumference 50 inches   Waist to Hip Ratio 0.9 %   Triceps Skinfold 31 mm   % Body Fat 35.3 %   Grip Strength 38 kg   Flexibility 10 in   Single Leg Stand 25 seconds       Nutrition Therapy Plan and Nutrition Goals:     Nutrition Therapy & Goals - 07/21/17 1602      Nutrition Therapy   Diet Carb Modified, Therapeutic Lifestyle Changes     Personal Nutrition Goals   Nutrition Goal Wt loss of 1-2 lb/week to a wt loss goal of 6-24 lb at graduation from Cardiac Rehab.      Intervention Plan   Intervention Prescribe, educate and counsel regarding individualized specific dietary modifications aiming towards targeted core components such as weight, hypertension, lipid management, diabetes, heart failure and other comorbidities.   Expected Outcomes Short Term Goal: Understand basic principles of dietary content, such as calories, fat, sodium, cholesterol and nutrients.;Long Term Goal: Adherence to prescribed nutrition plan.      Nutrition Discharge: Nutrition Scores:     Nutrition Assessments - 07/21/17 1602      MEDFICTS Scores   Pre Score 39       Nutrition Goals Re-Evaluation:   Nutrition Goals Re-Evaluation:   Nutrition Goals Discharge (Final Nutrition Goals Re-Evaluation):   Psychosocial: Target Goals: Acknowledge presence or absence of significant depression and/or stress, maximize coping skills, provide positive support system. Participant is able to verbalize types and ability to use techniques and skills needed for reducing stress and depression.  Initial Review & Psychosocial Screening:     Initial Psych Review & Screening - 07/23/17 1146      Initial Review   Current issues with History of Depression;Current Anxiety/Panic   Comments per nursing assessment pt reports medium level of depression and anxiety.  Rates low level of stress "everyday"      Quality of Life Scores:   PHQ-9: Recent Review Flowsheet Data    There is no flowsheet data to display.     Interpretation of Total Score  Total Score Depression Severity:  1-4 = Minimal depression, 5-9 = Mild depression, 10-14 = Moderate depression, 15-19 = Moderately severe depression, 20-27 = Severe depression   Psychosocial Evaluation and Intervention:   Psychosocial Re-Evaluation:   Psychosocial Discharge (Final Psychosocial Re-Evaluation):   Vocational Rehabilitation: Provide vocational rehab assistance to qualifying candidates.   Vocational Rehab Evaluation & Intervention:   Education: Education Goals: Education classes will be provided on a weekly basis, covering required topics. Participant will state understanding/return demonstration of topics presented.  Learning Barriers/Preferences:     Learning Barriers/Preferences - 07/21/17 1439      Learning Barriers/Preferences   Learning Barriers Sight   Learning Preferences Skilled Demonstration;Verbal Instruction      Education Topics: Count Your Pulse:  -Group instruction provided by verbal instruction, demonstration, patient participation and written materials to support subject.   Instructors address importance of being able to find your pulse and how to count your pulse when at home  without a heart monitor.  Patients get hands on experience counting their pulse with staff help and individually.   Heart Attack, Angina, and Risk Factor Modification:  -Group instruction provided by verbal instruction, video, and written materials to support subject.  Instructors address signs and symptoms of angina and heart attacks.    Also discuss risk factors for heart disease and how to make changes to improve heart health risk factors.   Functional Fitness:  -Group instruction provided by verbal instruction, demonstration, patient participation, and written materials to support subject.  Instructors address safety measures for doing things around the house.  Discuss how to get up and down off the floor, how to pick things up properly, how to safely get out of a chair without assistance, and balance training.   Meditation and Mindfulness:  -Group instruction provided by verbal instruction, patient participation, and written materials to support subject.  Instructor addresses importance of mindfulness and meditation practice to help reduce stress and improve awareness.  Instructor also leads participants through a meditation exercise.    Stretching for Flexibility and Mobility:  -Group instruction provided by verbal instruction, patient participation, and written materials to support subject.  Instructors lead participants through series of stretches that are designed to increase flexibility thus improving mobility.  These stretches are additional exercise for major muscle groups that are typically performed during regular warm up and cool down.   Hands Only CPR:  -Group verbal, video, and participation provides a basic overview of AHA guidelines for community CPR. Role-play of emergencies allow participants the opportunity to practice calling for help and chest compression technique  with discussion of AED use.   Hypertension: -Group verbal and written instruction that provides a basic overview of hypertension including the most recent diagnostic guidelines, risk factor reduction with self-care instructions and medication management.    Nutrition I class: Heart Healthy Eating:  -Group instruction provided by PowerPoint slides, verbal discussion, and written materials to support subject matter. The instructor gives an explanation and review of the Therapeutic Lifestyle Changes diet recommendations, which includes a discussion on lipid goals, dietary fat, sodium, fiber, plant stanol/sterol esters, sugar, and the components of a well-balanced, healthy diet.   CARDIAC RE[facility]AB [facility]  Date  07/21/17  Educator  RD  Instruction Review Code  Not applicable      Nutrition II class: Lifestyle Skills:  -Group instruction provided by PowerPoint slides, verbal discussion, and written materials to support subject matter. The instructor gives an explanation and review of label reading, grocery shopping for heart health, heart healthy recipe modifications, and ways to make healthier choices when eating out.   CARDIAC REHAB PHASE II ORIENTATION [facility]  Date  07/21/17  Educator  RD  Instruction Review Code  Not applicable      Diabetes Question & Answer:  -Group instruction provided by PowerPoint slides, verbal discussion, and written materials to support subject matter. The instructor gives an explanation and review of diabetes co-morbidities, pre- and post-prandial blood glucose goals, pre-exercise blood glucose goals, signs, symptoms, and treatment of hypoglycemia and hyperglycemia, and foot care basics.   Diabetes Blitz:  -Group instruction provided by PowerPoint slides, verbal discussion, and written materials to support subject matter. The instructor  gives an explanation and review of the physiology behind type 1 and type 2 diabetes, diabetes medications and rational behind using different medications, pre- and post-prandial blood glucose recommendations  and Hemoglobin A1c goals, diabetes diet, and exercise including blood glucose guidelines for exercising safely.    CARDIAC REHAB PHASE II ORIENTATION from 07/21/2017 in [facility] CARDIAC REHAB  Date  07/21/17  Educator  RD  Instruction Review Code  Not applicable      Portion Distortion:  -Group instruction provided by PowerPoint slides, verbal discussion, written materials, and food models to support subject matter. The instructor gives an explanation of serving size versus portion size, changes in portions sizes over the last 20 years, and what consists of a serving from each food group.   Stress Management:  -Group instruction provided by verbal instruction, video, and written materials to support subject matter.  Instructors review role of stress in heart disease and how to cope with stress positively.     Exercising on Your Own:  -Group instruction provided by verbal instruction, power point, and written materials to support subject.  Instructors discuss benefits of exercise, components of exercise, frequency and intensity of exercise, and end points for exercise.  Also discuss use of nitroglycerin and activating EMS.  Review options of places to exercise outside of rehab.  Review guidelines for sex with heart disease.   Cardiac Drugs I:  -Group instruction provided by verbal instruction and written materials to support subject.  Instructor reviews cardiac drug classes: antiplatelets, anticoagulants, beta blockers, and statins.  Instructor discusses reasons, side effects, and lifestyle considerations for each drug class.   Cardiac Drugs II:  -Group instruction provided by verbal instruction and written materials to support subject.  Instructor reviews cardiac drug  classes: angiotensin converting enzyme inhibitors (ACE-I), angiotensin II receptor blockers (ARBs), nitrates, and calcium channel blockers.  Instructor discusses reasons, side effects, and lifestyle considerations for each drug class.   Anatomy and Physiology of the Circulatory System:  Group verbal and written instruction and models provide basic cardiac anatomy and physiology, with the coronary electrical and arterial systems. Review of: AMI, Angina, Valve disease, Heart Failure, Peripheral Artery Disease, Cardiac Arrhythmia, Pacemakers, and the ICD.   Other Education:  -Group or individual verbal, written, or video instructions that support the educational goals of the cardiac rehab program.   Knowledge Questionnaire Score:     Knowledge Questionnaire Score - 07/21/17 1617      Knowledge Questionnaire Score   Pre Score 21/24      Core Components/Risk Factors/Patient Goals at Admission:     Personal Goals and Risk Factors at Admission - 07/21/17 1443      Core Components/Risk Factors/Patient Goals on Admission    Weight Management Yes;Weight Loss;Weight Maintenance;Obesity   Intervention Weight Management: Develop a combined nutrition and exercise program designed to reach desired caloric intake, while maintaining appropriate intake of nutrient and fiber, sodium and fats, and appropriate energy expenditure required for the weight goal.;Weight Management: Provide education and appropriate resources to help participant work on and attain dietary goals.;Weight Management/Obesity: Establish reasonable short term and long term weight goals.;Obesity: Provide education and appropriate resources to help participant work on and attain dietary goals.   Expected Outcomes Short Term: Continue to assess and modify interventions until short term weight is achieved;Weight Maintenance: Understanding of the daily nutrition guidelines, which includes 25-35% calories from fat, 7% or less cal from  saturated fats, less than 200mg  cholesterol, less than 1.5gm of sodium, & 5 or more servings of fruits and vegetables daily;Long Term: Adherence to nutrition and physical activity/exercise program aimed toward attainment of established weight goal;Weight Loss: Understanding of general recommendations for  a balanced deficit meal plan, which promotes 1-2 lb weight loss per week and includes a negative energy balance of [phone] kcal/d;Understanding recommendations for meals to include 15-35% energy as protein, 25-35% energy from fat, 35-60% energy from carbohydrates, less than 200mg  of dietary cholesterol, 20-35 gm of total fiber daily;Understanding of distribution of calorie intake throughout the day with the consumption of 4-5 meals/snacks;Weight Gain: Understanding of general recommendations for a high calorie, high protein meal plan that promotes weight gain by distributing calorie intake throughout the day with the consumption for 4-5 meals, snacks, and/or supplements   Diabetes Yes   Intervention Provide education about signs/symptoms and action to take for hypo/hyperglycemia.;Provide education about proper nutrition, including hydration, and aerobic/resistive exercise prescription along with prescribed medications to achieve blood glucose in normal ranges: Fasting glucose 65-99 mg/dL   Expected Outcomes Short Term: Participant verbalizes understanding of the signs/symptoms and immediate care of hyper/hypoglycemia, proper foot care and importance of medication, aerobic/resistive exercise and nutrition plan for blood glucose control.;Long Term: Attainment of HbA1C < 7%.   Hypertension Yes   Intervention Provide education on lifestyle modifcations including regular physical activity/exercise, weight management, moderate sodium restriction and increased consumption of fresh fruit, vegetables, and low fat dairy, alcohol moderation, and smoking cessation.;Monitor prescription use compliance.   Expected Outcomes  Short Term: Continued assessment and intervention until BP is < 140/5390mm HG in hypertensive participants. < 130/3980mm HG in hypertensive participants with diabetes, heart failure or chronic kidney disease.;Long Term: Maintenance of blood pressure at goal levels.   Lipids Yes   Intervention Provide education and support for participant on nutrition & aerobic/resistive exercise along with prescribed medications to achieve LDL 70mg , HDL >40mg .   Expected Outcomes Short Term: Participant states understanding of desired cholesterol values and is compliant with medications prescribed. Participant is following exercise prescription and nutrition guidelines.;Long Term: Cholesterol controlled with medications as prescribed, with individualized exercise RX and with personalized nutrition plan. Value goals: LDL < 70mg , HDL > 40 mg.   Personal Goal Other Yes   Personal Goal Decrease fear avoidance, learn exercise limitations and lose weight. Overall goal is to be heart healthy   Intervention Provide cardiac education classes and exercise programming to assist with building confidence with actvity, and learn activity restrictions to promote a heart healthy lifestyle and    Expected Outcomes Pt will be able to lose wt, decrease fear avoidance and learn activity limitations.      Core Components/Risk Factors/Patient Goals Review:    Core Components/Risk Factors/Patient Goals at Discharge (Final Review):    ITP Comments:     ITP Comments    Row Name 07/21/17 1421           ITP Comments Dr. Armanda Magicraci Turner, Medical Director          Comments:  Patient attended orientation on 7/31 from 1400 to 1501 to review rules and guidelines for program. Completed 6 minute walk test, Intitial ITP, and exercise prescription.  VSS. Telemetry-SR.  Asymptomatic. 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   Pt is excited to participate in cardiac rehab. Alanson Alyarlette Kentavious Michele RN, BSN Cardiac and Emergency planning/management officerulmonary Rehab Nurse Navigator

## 2017-07-27 ENCOUNTER — Encounter (HOSPITAL_COMMUNITY)
Admission: RE | Admit: 2017-07-27 | Discharge: 2017-07-27 | Disposition: A | Discharge status: Home / Self Care | Payer: BLUE CROSS/BLUE SHIELD | Source: Ambulatory Visit | Attending: Cardiovascular Disease | Admitting: Cardiovascular Disease

## 2017-07-27 ENCOUNTER — Encounter (HOSPITAL_COMMUNITY): Payer: Self-pay

## 2017-07-27 DIAGNOSIS — Z951 Presence of aortocoronary bypass graft: Secondary | ICD-10-CM | POA: Diagnosis not present

## 2017-07-27 DIAGNOSIS — Z48812 Encounter for surgical aftercare following surgery on the circulatory system: Secondary | ICD-10-CM | POA: Diagnosis not present

## 2017-07-27 LAB — GLUCOSE, CAPILLARY
GLUCOSE-CAPILLARY: 101 mg/dL — AB (ref 65–99)
GLUCOSE-CAPILLARY: 117 mg/dL — AB (ref 65–99)

## 2017-07-27 NOTE — Progress Notes (Signed)
Daily Session Note  Patient Details  Name: [name] Hammond MRN: [id number] Date of Birth: 08/01/1961 Referring Provider:     CARDIAC REHAB PHASE II ORIENTATION from 07/21/2017 in [place]  Referring Provider  Skeet Latch MD      Encounter Date: 07/27/2017  Check In:     Session Check In - 07/27/17 1441      Check-In   Location [facility]   Staff Present Andi Hence, RN, Marga Melnick, RN, BSN;Molly diVincenzo, MS, ACSM RCEP, Exercise Physiologist   Supervising physician immediately available to respond to emergencies [facility] immediately available   Physician(s) Dr Allyson Sabal   Medication changes reported     No   Fall or balance concerns reported    No   Tobacco Cessation No Change   Warm-up and Cool-down Performed as group-led instruction   Resistance Training Performed No   VAD Patient? No     Pain Assessment   Currently in Pain? No/denies      Capillary Blood Glucose: Results for orders placed or performed during the hospital encounter of 07/27/17 (from the past 24 hour(s))  Glucose, capillary     Status: Abnormal   Collection Time: 07/27/17  1:36 PM  Result Value Ref Range   Glucose-Capillary 117 (H) 65 - 99 mg/dL  Glucose, capillary     Status: Abnormal   Collection Time: 07/27/17  2:13 PM  Result Value Ref Range   Glucose-Capillary 101 (H) 65 - 99 mg/dL      History  Smoking Status  . Never Smoker  Smokeless Tobacco  . Never Used    Goals Met:  Exercise tolerated well  Goals Unmet:  Not Applicable  Comments: Pt started cardiac rehab today.  Pt tolerated light exercise without difficulty. VSS, telemetry-sinus rhythm,  asymptomatic.  Medication list reconciled. Pt denies barriers to medicaiton compliance.  PSYCHOSOCIAL ASSESSMENT:  PHQ-6. Pt exhibits positive coping skills, hopeful outlook with supportive family. No psychosocial needs identified at this time, no psychosocial interventions  necessary.    Pt enjoys movies, his Occupational psychologist.   pt reports he formally enjoyed physical activity such as basketball but has been able to play "in a long time".  Pt goals for cardiac rehab are to lose weight and develop exercise routine.    Pt ultimate weight loss goal is 50lb.  Pt encouraged to participate in CR exercise, nutrition and educational opportunities to increase his ability to reach these goals.  Pt also counseled on realistic weight loss goals during CR 12 week time period.   Pt oriented to exercise equipment and routine.    Understanding verbalized.   Dr. Fransico Him is Medical Director for Cardiac Rehab at [facility].

## 2017-07-29 ENCOUNTER — Encounter (HOSPITAL_COMMUNITY): Payer: BLUE CROSS/BLUE SHIELD

## 2017-07-29 ENCOUNTER — Encounter (HOSPITAL_COMMUNITY)
Admission: RE | Admit: 2017-07-29 | Discharge: 2017-07-29 | Disposition: A | Discharge status: Home / Self Care | Payer: BLUE CROSS/BLUE SHIELD | Source: Ambulatory Visit

## 2017-07-29 DIAGNOSIS — Z48812 Encounter for surgical aftercare following surgery on the circulatory system: Secondary | ICD-10-CM | POA: Diagnosis not present

## 2017-07-30 LAB — GLUCOSE, CAPILLARY: Glucose-Capillary: 91 mg/dL (ref 65–99)

## 2017-07-30 NOTE — Progress Notes (Signed)
Incomplete Session Note  Patient Details  Name: [name] Hammond MRN: [id number] Date of Birth: 10/05/1961 Referring Provider:     CARDI[facility]HAB [facility]  Referring Provider  Chilton Siandolph, Tiffany MD      [name] Hammond did not complete his rehab session. [name] Hammond told me that he ate breakfast this morning and did not get a chance to eat lunch. CBG 91. Patient given lemonade. I advised that [name] Hammond not exercise today. Patient was given lemonade. [name] Hammond left cardiac rehab to get some lunch.[name] Hammond plans to return to exercise on Friday.[place] LighterMaria Latrish Mogel, RN,BSN 07/30/2017 12:51 PM

## 2017-07-31 ENCOUNTER — Encounter (HOSPITAL_COMMUNITY): Payer: BLUE CROSS/BLUE SHIELD

## 2017-08-03 ENCOUNTER — Encounter (HOSPITAL_COMMUNITY)
Admission: RE | Admit: 2017-08-03 | Discharge: 2017-08-03 | Disposition: A | Discharge status: Home / Self Care | Payer: BLUE CROSS/BLUE SHIELD | Source: Ambulatory Visit | Attending: Cardiovascular Disease | Admitting: Cardiovascular Disease

## 2017-08-03 DIAGNOSIS — Z951 Presence of aortocoronary bypass graft: Secondary | ICD-10-CM

## 2017-08-03 DIAGNOSIS — Z48812 Encounter for surgical aftercare following surgery on the circulatory system: Secondary | ICD-10-CM | POA: Diagnosis not present

## 2017-08-03 LAB — GLUCOSE, CAPILLARY
GLUCOSE-CAPILLARY: 106 mg/dL — AB (ref 65–99)
GLUCOSE-CAPILLARY: 106 mg/dL — AB (ref 65–99)

## 2017-08-05 ENCOUNTER — Encounter (HOSPITAL_COMMUNITY)
Admission: RE | Admit: 2017-08-05 | Discharge: 2017-08-05 | Disposition: A | Discharge status: Home / Self Care | Payer: BLUE CROSS/BLUE SHIELD | Source: Ambulatory Visit | Attending: Cardiovascular Disease | Admitting: Cardiovascular Disease

## 2017-08-05 DIAGNOSIS — Z951 Presence of aortocoronary bypass graft: Secondary | ICD-10-CM

## 2017-08-05 DIAGNOSIS — Z48812 Encounter for surgical aftercare following surgery on the circulatory system: Secondary | ICD-10-CM | POA: Diagnosis not present

## 2017-08-05 LAB — GLUCOSE, CAPILLARY: Glucose-Capillary: 118 mg/dL — ABNORMAL HIGH (ref 65–99)

## 2017-08-06 ENCOUNTER — Telehealth: Payer: Self-pay | Admitting: Cardiovascular Disease

## 2017-08-06 MED ORDER — ATORVASTATIN CALCIUM 80 MG PO TABS: 80.0000 mg | ORAL_TABLET | Freq: Every day | ORAL | 2 refills | Status: DC

## 2017-08-06 NOTE — Progress Notes (Signed)
Cardiac Individual Treatment Plan  Patient Details  Name: [name] Hammond MRN: [id number] Date of Birth: 24-May-1961 Referring Provider:     CARDIAC REHAB PHASE II ORIENTATION from 07/21/2017 in [facility] CARDIAC REHAB  Referring Provider  Chilton Si MD      Initial Encounter Date:    CARDIAC REHAB PHASE II ORIENTATION from 07/21/2017 in [facility] CARDIAC REHAB  Date  07/21/17  Referring Provider  Chilton Si MD      Visit Diagnosis: 05/13/17 S/P CABG (coronary artery bypass graft)  Patient's Home Medications on Admission:  Current Outpatient Prescriptions:  .  aspirin 325 MG EC tablet, Take 1 tablet (325 mg total) by mouth daily., Disp: , Rfl:  .  atorvastatin (LIPITOR) 80 MG tablet, Take 1 tablet (80 mg total) by mouth at bedtime., Disp: 30 tablet, Rfl: 1 .  levothyroxine (SYNTHROID, LEVOTHROID) 100 MCG tablet, Take 100 mcg by mouth daily before breakfast., Disp: , Rfl:  .  lisinopril-hydrochlorothiazide (PRINZIDE,ZESTORETIC) 20-12.5 MG tablet, Take 1 tablet by mouth daily at 12 noon. , Disp: , Rfl:  .  metoprolol tartrate (LOPRESSOR) 25 MG tablet, Take 0.5 tablets (12.5 mg total) by mouth 2 (two) times daily., Disp: 60 tablet, Rfl: 1 .  Saxagliptin-Metformin (KOMBIGLYZE XR) 04-999 MG TB24, Take 1 tablet by mouth daily with breakfast. , Disp: , Rfl:  .  sertraline (ZOLOFT) 50 MG tablet, Take 50 mg by mouth every evening. , Disp: , Rfl:   Past Medical History: Past Medical History:  Diagnosis Date  . Anxiety   . Diabetes mellitus without complication (HCC)   . Hypertension   . Hypothyroid   . Obstructive sleep apnea   . Pure hypercholesterolemia   . PVD (peripheral vascular disease) (HCC)   . Testicular hypofunction   . Vitamin D deficiency     Tobacco Use: History  Smoking Status  . Never Smoker  Smokeless Tobacco  . Never Used    Labs: Recent Review Flowsheet Data    Labs for ITP Cardiac and Pulmonary Rehab Latest  Ref Rng & Units 05/12/2017 05/12/2017 05/12/2017 05/12/2017 05/13/2017   Cholestrol 0 - 200 mg/dL - - - - -   LDLCALC 0 - 99 mg/dL - - - - -   HDL >40 mg/dL - - - - -   Trlycerides <150 mg/dL - - - - -   Hemoglobin A1c 4.8 - 5.6 % - - - - -   PHART 7.350 - 7.450 7.306(L) 7.310(L) - 7.319(L) 7.347(L)   PCO2ART 32.0 - 48.0 mmHg 46.2 47.7 - 44.5 44.4   HCO3 20.0 - 28.0 mmol/L 23.1 24.0 - 22.8 23.7   TCO2 0 - 100 mmol/L 25 25 25 24 25    ACIDBASEDEF 0.0 - 2.0 mmol/L 3.0(H) 2.0 - 3.0(H) 1.2   O2SAT % 97.0 99.0 - 95.0 93.6      Capillary Blood Glucose: Lab Results  Component Value Date   GLUCAP 118 (H) 08/05/2017   GLUCAP 106 (H) 08/03/2017   GLUCAP 106 (H) 08/03/2017   GLUCAP 91 07/29/2017   GLUCAP 101 (H) 07/27/2017     Exercise Target Goals:    Exercise Program Goal: Individual exercise prescription set with THRR, safety & activity barriers. Participant demonstrates ability to understand and report RPE using BORG scale, to self-measure pulse accurately, and to acknowledge the importance of the exercise prescription.  Exercise Prescription Goal: Starting with aerobic activity 30 plus minutes a day, 3 days per week for initial exercise prescription. Provide home  exercise prescription and guidelines that participant acknowledges understanding prior to discharge.  Activity Barriers & Risk Stratification:     Activity Barriers & Cardiac Risk Stratification - 07/21/17 1440      Activity Barriers & Cardiac Risk Stratification   Activity Barriers Muscular Weakness;Deconditioning   Cardiac Risk Stratification High      6 Minute Walk:     6 Minute Walk    Row Name 07/21/17 1557         6 Minute Walk   Phase Initial     Distance 1692 feet     Walk Time 6 minutes     # of Rest Breaks 0     MPH 3.2     METS 4.08     RPE 13     VO2 Peak 14.29     Resting HR 71 bpm     Resting BP 122/64     Max Ex. HR 128 bpm     Max Ex. BP 140/70     2 Minute Post BP 114/82         Oxygen Initial Assessment:   Oxygen Re-Evaluation:   Oxygen Discharge (Final Oxygen Re-Evaluation):   Initial Exercise Prescription:     Initial Exercise Prescription - 07/21/17 1600      Date of Initial Exercise RX and Referring Provider   Date 07/21/17   Referring Provider Chilton Si MD     Treadmill   MPH 3   Grade 0   Minutes 10   METs 3.3     Bike   Level 1   Minutes 10   METs 2.4     NuStep   Level 3   SPM 80   Minutes 10   METs 2     Prescription Details   Frequency (times per week) 3   Duration Progress to 30 minutes of continuous aerobic without signs/symptoms of physical distress     Intensity   THRR 40-80% of Max Heartrate 66-132   Ratings of Perceived Exertion 11-13   Perceived Dyspnea 0-4     Progression   Progression Continue progressive overload as per policy without signs/symptoms or physical distress.     Resistance Training   Training Prescription Yes   Weight 3lbs   Reps 10-15      Perform Capillary Blood Glucose checks as needed.  Exercise Prescription Changes:     Exercise Prescription Changes    Row Name 07/27/17 1502 08/05/17 1500           Response to Exercise   Blood Pressure (Admit) 104/74 110/70      Blood Pressure (Exercise) 168/80 158/80      Blood Pressure (Exit) 130/86 118/70      Heart Rate (Admit) 86 bpm 80 bpm      Heart Rate (Exercise) 129 bpm 108 bpm      Heart Rate (Exit) 78 bpm 69 bpm      Rating of Perceived Exertion (Exercise) 13 12      Symptoms none none      Comments pt was oriented to exercise equipment  -      Duration Continue with 30 min of aerobic exercise without signs/symptoms of physical distress. Continue with 30 min of aerobic exercise without signs/symptoms of physical distress.      Intensity THRR unchanged THRR unchanged        Progression   Progression Continue to progress workloads to maintain intensity without signs/symptoms of physical distress. Continue to progress  workloads to maintain intensity without signs/symptoms of physical distress.      Average METs 2.6 3.1        Resistance Training   Training Prescription Yes Yes      Weight 3lbs 4lbs      Reps 10-15 10-15      Time 10 Minutes 10 Minutes        Treadmill   MPH 3  -      Grade 0  -      Minutes 10  -      METs 3.3  -        Bike   Level  - 1      Minutes  - 10      METs  - 2.4        NuStep   Level 3 4      SPM 80 80      Minutes 10 10      METs 2.1 2.6         Exercise Comments:     Exercise Comments    Row Name 07/27/17 1517           Exercise Comments Pt was oriented to exercise equipment today. Pt responded well to exercise session; will continue to monitor actvity levels.          Exercise Goals and Review:     Exercise Goals    Row Name 07/21/17 1440             Exercise Goals   Increase Physical Activity Yes       Intervention Provide advice, education, support and counseling about physical activity/exercise needs.;Develop an individualized exercise prescription for aerobic and resistive training based on initial evaluation findings, risk stratification, comorbidities and participant's personal goals.       Expected Outcomes Achievement of increased cardiorespiratory fitness and enhanced flexibility, muscular endurance and strength shown through measurements of functional capacity and personal statement of participant.       Increase Strength and Stamina Yes       Intervention Provide advice, education, support and counseling about physical activity/exercise needs.;Develop an individualized exercise prescription for aerobic and resistive training based on initial evaluation findings, risk stratification, comorbidities and participant's personal goals.       Expected Outcomes Achievement of increased cardiorespiratory fitness and enhanced flexibility, muscular endurance and strength shown through measurements of functional capacity and personal statement  of participant.          Exercise Goals Re-Evaluation :    Discharge Exercise Prescription (Final Exercise Prescription Changes):     Exercise Prescription Changes - 08/05/17 1500      Response to Exercise   Blood Pressure (Admit) 110/70   Blood Pressure (Exercise) 158/80   Blood Pressure (Exit) 118/70   Heart Rate (Admit) 80 bpm   Heart Rate (Exercise) 108 bpm   Heart Rate (Exit) 69 bpm   Rating of Perceived Exertion (Exercise) 12   Symptoms none   Duration Continue with 30 min of aerobic exercise without signs/symptoms of physical distress.   Intensity THRR unchanged     Progression   Progression Continue to progress workloads to maintain intensity without signs/symptoms of physical distress.   Average METs 3.1     Resistance Training   Training Prescription Yes   Weight 4lbs   Reps 10-15   Time 10 Minutes     Bike   Level 1   Minutes 10   METs 2.4  NuStep   Level 4   SPM 80   Minutes 10   METs 2.6      Nutrition:  Target Goals: Understanding of nutrition guidelines, daily intake of sodium 1500mg , cholesterol 200mg , calories 30% from fat and 7% or less from saturated fats, daily to have 5 or more servings of fruits and vegetables.  Biometrics:     Pre Biometrics - 07/21/17 1555      Pre Biometrics   Waist Circumference 44.75 inches   Hip Circumference 50 inches   Waist to Hip Ratio 0.9 %   Triceps Skinfold 31 mm   % Body Fat 35.3 %   Grip Strength 38 kg   Flexibility 10 in   Single Leg Stand 25 seconds       Nutrition Therapy Plan and Nutrition Goals:     Nutrition Therapy & Goals - 07/21/17 1602      Nutrition Therapy   Diet Carb Modified, Therapeutic Lifestyle Changes     Personal Nutrition Goals   Nutrition Goal Wt loss of 1-2 lb/week to a wt loss goal of 6-24 lb at graduation from Cardiac Rehab.      Intervention Plan   Intervention Prescribe, educate and counsel regarding individualized specific dietary modifications aiming  towards targeted core components such as weight, hypertension, lipid management, diabetes, heart failure and other comorbidities.   Expected Outcomes Short Term Goal: Understand basic principles of dietary content, such as calories, fat, sodium, cholesterol and nutrients.;Long Term Goal: Adherence to prescribed nutrition plan.      Nutrition Discharge: Nutrition Scores:     Nutrition Assessments - 07/21/17 1602      MEDFICTS Scores   Pre Score 39      Nutrition Goals Re-Evaluation:   Nutrition Goals Re-Evaluation:   Nutrition Goals Discharge (Final Nutrition Goals Re-Evaluation):   Psychosocial: Target Goals: Acknowledge presence or absence of significant depression and/or stress, maximize coping skills, provide positive support system. Participant is able to verbalize types and ability to use techniques and skills needed for reducing stress and depression.  Initial Review & Psychosocial Screening:     Initial Psych Review & Screening - 07/27/17 1627      Barriers   Psychosocial barriers to participate in program The patient should benefit from training in stress management and relaxation.     Screening Interventions   Interventions Encouraged to exercise;Program counselor consult;Provide feedback about the scores to participant      Quality of Life Scores:   PHQ-9: Recent Review Flowsheet Data    Depression screen [facility] 07/27/2017   Decreased Interest 1   Down, Depressed, Hopeless 1   PHQ - 2 Score 2   Altered sleeping 1   Tired, decreased energy 1   Change in appetite 1   Feeling bad or failure about yourself  1   Trouble concentrating 0   Moving slowly or fidgety/restless 0   Suicidal thoughts 0   PHQ-9 Score 6   Difficult doing work/chores Somewhat difficult     Interpretation of Total Score  Total Score Depression Severity:  1-4 = Minimal depression, 5-9 = Mild depression, 10-14 = Moderate depression, 15-19 = Moderately severe depression, 20-27 =  Severe depression   Psychosocial Evaluation and Intervention:     Psychosocial Evaluation - 07/27/17 1627      Psychosocial Evaluation & Interventions   Interventions Stress management education;Relaxation education;Encouraged to exercise with the program and follow exercise prescription   Comments pt with known history of depression,currently treated wtih  zoloft by PCP. at pt request, appt will be scheduled with Theda Belfast.    Expected Outcomes pt will demonstrate improved outlook with positive coping skills   Continue Psychosocial Services  Follow up required by staff      Psychosocial Re-Evaluation:     Psychosocial Re-Evaluation    Row Name 08/06/17 1136             Psychosocial Re-Evaluation   Current issues with None Identified;Current Stress Concerns;Current Anxiety/Panic;Current Depression       Comments pt with health related stress/anxiety and depression. appt will be made with Theda Belfast for counseling.         Expected Outcomes pt will exhibit positive outlook with good coping skills.        Interventions Encouraged to attend Cardiac Rehabilitation for the exercise       Continue Psychosocial Services  Follow up required by staff       Comments per nursing assessment pt reports medium level of depression and anxiety.  Rates low level of stress "everyday"         Initial Review   Source of Stress Concerns Chronic Illness;Poor Coping Skills;Unable to participate in former interests or hobbies          Psychosocial Discharge (Final Psychosocial Re-Evaluation):     Psychosocial Re-Evaluation - 08/06/17 1136      Psychosocial Re-Evaluation   Current issues with None Identified;Current Stress Concerns;Current Anxiety/Panic;Current Depression   Comments pt with health related stress/anxiety and depression. appt will be made with Theda Belfast for counseling.     Expected Outcomes pt will exhibit positive outlook with good coping skills.    Interventions  Encouraged to attend Cardiac Rehabilitation for the exercise   Continue Psychosocial Services  Follow up required by staff   Comments per nursing assessment pt reports medium level of depression and anxiety.  Rates low level of stress "everyday"     Initial Review   Source of Stress Concerns Chronic Illness;Poor Coping Skills;Unable to participate in former interests or hobbies      Vocational Rehabilitation: Provide vocational rehab assistance to qualifying candidates.   Vocational Rehab Evaluation & Intervention:   Education: Education Goals: Education classes will be provided on a weekly basis, covering required topics. Participant will state understanding/return demonstration of topics presented.  Learning Barriers/Preferences:     Learning Barriers/Preferences - 07/21/17 1439      Learning Barriers/Preferences   Learning Barriers Sight   Learning Preferences Skilled Demonstration;Verbal Instruction      Education Topics: Count Your Pulse:  -Group instruction provided by verbal instruction, demonstration, patient participation and written materials to support subject.  Instructors address importance of being able to find your pulse and how to count your pulse when at home without a heart monitor.  Patients get hands on experience counting their pulse with staff help and individually.   Heart Attack, Angina, and Risk Factor Modification:  -Group instruction provided by verbal instruction, video, and written materials to support subject.  Instructors address signs and symptoms of angina and heart attacks.    Also discuss risk factors for heart disease and how to make changes to improve heart health risk factors.   Functional Fitness:  -Group instruction provided by verbal instruction, demonstration, patient participation, and written materials to support subject.  Instructors address safety measures for doing things around the house.  Discuss how to get up and down off the  floor, how to pick things up properly, how to safely get  out of a chair without assistance, and balance training.   Meditation and Mindfulness:  -Group instruction provided by verbal instruction, patient participation, and written materials to support subject.  Instructor addresses importance of mindfulness and meditation practice to help reduce stress and improve awareness.  Instructor also leads participants through a meditation exercise.    CARDIAC REHAB PHASE II EXERCISE from 08/05/2017 in [facility] CARDIAC REHAB  Date  08/05/17  Instruction Review Code  2- meets goals/outcomes      Stretching for Flexibility and Mobility:  -Group instruction provided by verbal instruction, patient participation, and written materials to support subject.  Instructors lead participants through series of stretches that are designed to increase flexibility thus improving mobility.  These stretches are additional exercise for major muscle groups that are typically performed during regular warm up and cool down.   Hands Only CPR:  -Group verbal, video, and participation provides a basic overview of AHA guidelines for community CPR. Role-play of emergencies allow participants the opportunity to practice calling for help and chest compression technique with discussion of AED use.   Hypertension: -Group verbal and written instruction that provides a basic overview of hypertension including the most recent diagnostic guidelines, risk factor reduction with self-care instructions and medication management.    Nutrition I class: Heart Healthy Eating:  -Group instruction provided by PowerPoint slides, verbal discussion, and written materials to support subject matter. The instructor gives an explanation and review of the Therapeutic Lifestyle Changes diet recommendations, which includes a discussion on lipid goals, dietary fat, sodium, fiber, plant stanol/sterol esters, sugar, and the components of  a well-balanced, healthy diet.   CARDIAC REHAB PHASE II ORIENTATION from 07/21/2017 in [facility] CARDIAC REHAB  Date  07/21/17  Educator  RD  Instruction Review Code  Not applicable      Nutrition II class: Lifestyle Skills:  -Group instruction provided by PowerPoint slides, verbal discussion, and written materials to support subject matter. The instructor gives an explanation and review of label reading, grocery shopping for heart health, heart healthy recipe modifications, and ways to make healthier choices when eating out.   CARDIAC REHAB PHASE II ORIENTATION from 07/21/2017 in [facility] CARDIAC REHAB  Date  07/21/17  Educator  RD  Instruction Review Code  Not applicable      Diabetes Question & Answer:  -Group instruction provided by PowerPoint slides, verbal discussion, and written materials to support subject matter. The instructor gives an explanation and review of diabetes co-morbidities, pre- and post-prandial blood glucose goals, pre-exercise blood glucose goals, signs, symptoms, and treatment of hypoglycemia and hyperglycemia, and foot care basics.   Diabetes Blitz:  -Group instruction provided by PowerPoint slides, verbal discussion, and written materials to support subject matter. The instructor gives an explanation and review of the physiology behind type 1 and type 2 diabetes, diabetes medications and rational behind using different medications, pre- and post-prandial blood glucose recommendations and Hemoglobin A1c goals, diabetes diet, and exercise including blood glucose guidelines for exercising safely.    CARDIAC REHAB PHASE II ORIENTATION from 07/21/2017 in [facility] CARDIAC REHAB  Date  07/21/17  Educator  RD  Instruction Review Code  Not applicable      Portion Distortion:  -Group instruction provided by PowerPoint slides, verbal discussion, written materials, and food models to support subject matter. The  instructor gives an explanation of serving size versus portion size, changes in portions sizes over the last 20 years, and what  consists of a serving from each food group.   Stress Management:  -Group instruction provided by verbal instruction, video, and written materials to support subject matter.  Instructors review role of stress in heart disease and how to cope with stress positively.     Exercising on Your Own:  -Group instruction provided by verbal instruction, power point, and written materials to support subject.  Instructors discuss benefits of exercise, components of exercise, frequency and intensity of exercise, and end points for exercise.  Also discuss use of nitroglycerin and activating EMS.  Review options of places to exercise outside of rehab.  Review guidelines for sex with heart disease.   Cardiac Drugs I:  -Group instruction provided by verbal instruction and written materials to support subject.  Instructor reviews cardiac drug classes: antiplatelets, anticoagulants, beta blockers, and statins.  Instructor discusses reasons, side effects, and lifestyle considerations for each drug class.   CARDIAC REHAB PHASE II EXERCISE from 08/05/2017 in [facility] CARDIAC REHAB  Date  07/29/17  Instruction Review Code  2- meets goals/outcomes      Cardiac Drugs II:  -Group instruction provided by verbal instruction and written materials to support subject.  Instructor reviews cardiac drug classes: angiotensin converting enzyme inhibitors (ACE-I), angiotensin II receptor blockers (ARBs), nitrates, and calcium channel blockers.  Instructor discusses reasons, side effects, and lifestyle considerations for each drug class.   Anatomy and Physiology of the Circulatory System:  Group verbal and written instruction and models provide basic cardiac anatomy and physiology, with the coronary electrical and arterial systems. Review of: AMI, Angina, Valve disease, Heart Failure,  Peripheral Artery Disease, Cardiac Arrhythmia, Pacemakers, and the ICD.   Other Education:  -Group or individual verbal, written, or video instructions that support the educational goals of the cardiac rehab program.   Knowledge Questionnaire Score:     Knowledge Questionnaire Score - 07/21/17 1617      Knowledge Questionnaire Score   Pre Score 21/24      Core Components/Risk Factors/Patient Goals at Admission:     Personal Goals and Risk Factors at Admission - 07/21/17 1443      Core Components/Risk Factors/Patient Goals on Admission    Weight Management Yes;Weight Loss;Weight Maintenance;Obesity   Intervention Weight Management: Develop a combined nutrition and exercise program designed to reach desired caloric intake, while maintaining appropriate intake of nutrient and fiber, sodium and fats, and appropriate energy expenditure required for the weight goal.;Weight Management: Provide education and appropriate resources to help participant work on and attain dietary goals.;Weight Management/Obesity: Establish reasonable short term and long term weight goals.;Obesity: Provide education and appropriate resources to help participant work on and attain dietary goals.   Expected Outcomes Short Term: Continue to assess and modify interventions until short term weight is achieved;Weight Maintenance: Understanding of the daily nutrition guidelines, which includes 25-35% calories from fat, 7% or less cal from saturated fats, less than 200mg  cholesterol, less than 1.5gm of sodium, & 5 or more servings of fruits and vegetables daily;Long Term: Adherence to nutrition and physical activity/exercise program aimed toward attainment of established weight goal;Weight Loss: Understanding of general recommendations for a balanced deficit meal plan, which promotes 1-2 lb weight loss per week and includes a negative energy balance of [phone] kcal/d;Understanding recommendations for meals to include 15-35%  energy as protein, 25-35% energy from fat, 35-60% energy from carbohydrates, less than 200mg  of dietary cholesterol, 20-35 gm of total fiber daily;Understanding of distribution of calorie intake throughout the day with the consumption of  4-5 meals/snacks;Weight Gain: Understanding of general recommendations for a high calorie, high protein meal plan that promotes weight gain by distributing calorie intake throughout the day with the consumption for 4-5 meals, snacks, and/or supplements   Diabetes Yes   Intervention Provide education about signs/symptoms and action to take for hypo/hyperglycemia.;Provide education about proper nutrition, including hydration, and aerobic/resistive exercise prescription along with prescribed medications to achieve blood glucose in normal ranges: Fasting glucose 65-99 mg/dL   Expected Outcomes Short Term: Participant verbalizes understanding of the signs/symptoms and immediate care of hyper/hypoglycemia, proper foot care and importance of medication, aerobic/resistive exercise and nutrition plan for blood glucose control.;Long Term: Attainment of HbA1C < 7%.   Hypertension Yes   Intervention Provide education on lifestyle modifcations including regular physical activity/exercise, weight management, moderate sodium restriction and increased consumption of fresh fruit, vegetables, and low fat dairy, alcohol moderation, and smoking cessation.;Monitor prescription use compliance.   Expected Outcomes Short Term: Continued assessment and intervention until BP is < 140/54mm HG in hypertensive participants. < 130/74mm HG in hypertensive participants with diabetes, heart failure or chronic kidney disease.;Long Term: Maintenance of blood pressure at goal levels.   Lipids Yes   Intervention Provide education and support for participant on nutrition & aerobic/resistive exercise along with prescribed medications to achieve LDL 70mg , HDL >40mg .   Expected Outcomes Short Term: Participant  states understanding of desired cholesterol values and is compliant with medications prescribed. Participant is following exercise prescription and nutrition guidelines.;Long Term: Cholesterol controlled with medications as prescribed, with individualized exercise RX and with personalized nutrition plan. Value goals: LDL < 70mg , HDL > 40 mg.   Personal Goal Other Yes   Personal Goal Decrease fear avoidance, learn exercise limitations and lose weight. Overall goal is to be heart healthy   Intervention Provide cardiac education classes and exercise programming to assist with building confidence with actvity, and learn activity restrictions to promote a heart healthy lifestyle and    Expected Outcomes Pt will be able to lose wt, decrease fear avoidance and learn activity limitations.      Core Components/Risk Factors/Patient Goals Review:      Goals and Risk Factor Review    Row Name 08/06/17 1006             Core Components/Risk Factors/Patient Goals Review   Personal Goals Review Weight Management/Obesity;Hypertension;Lipids;Diabetes;Other       Review pt with multiple CAD RF demonstrates eagerness to participate in CR activiites to decrease cardiac risk, fear and learn ways to improve health.         Expected Outcomes pt will participate in CR exercise, nutrition and lifestyle edcucation opportunities to decrease overall CAD RF.           Core Components/Risk Factors/Patient Goals at Discharge (Final Review):      Goals and Risk Factor Review - 08/06/17 1006      Core Components/Risk Factors/Patient Goals Review   Personal Goals Review Weight Management/Obesity;Hypertension;Lipids;Diabetes;Other   Review pt with multiple CAD RF demonstrates eagerness to participate in CR activiites to decrease cardiac risk, fear and learn ways to improve health.     Expected Outcomes pt will participate in CR exercise, nutrition and lifestyle edcucation opportunities to decrease overall CAD RF.        ITP Comments:     ITP Comments    Row Name 07/21/17 1421 08/06/17 1005         ITP Comments Dr. Armanda Magic, Medical Director Dr. Armanda Magic, Medical Director  Comments: Pt is making expected progress toward personal goals after completing 4 sessions. Recommend continued exercise and life style modification education including  stress management and relaxation techniques to decrease cardiac risk profile.

## 2017-08-06 NOTE — Telephone Encounter (Signed)
New message     *STAT* If patient is at the pharmacy, call can be transferred to refill team.   1. Which medications need to be refilled? (please list name of each medication and dose if known) atorvastatin (LIPITOR) 80 MG tablet  2. Which pharmacy/location (including street and city if local pharmacy) is medication to be sent to? [facility] in [place] [phone]  3. Do they need a 30 day or 90 day supply? 90 day supply

## 2017-08-06 NOTE — Telephone Encounter (Signed)
Rx sent to preferred pharmacy.

## 2017-08-07 ENCOUNTER — Encounter (HOSPITAL_COMMUNITY)
Admission: RE | Admit: 2017-08-07 | Discharge: 2017-08-07 | Disposition: A | Discharge status: Home / Self Care | Payer: BLUE CROSS/BLUE SHIELD | Source: Ambulatory Visit | Attending: Cardiovascular Disease | Admitting: Cardiovascular Disease

## 2017-08-07 DIAGNOSIS — Z951 Presence of aortocoronary bypass graft: Secondary | ICD-10-CM

## 2017-08-07 DIAGNOSIS — Z48812 Encounter for surgical aftercare following surgery on the circulatory system: Secondary | ICD-10-CM | POA: Diagnosis not present

## 2017-08-07 LAB — GLUCOSE, CAPILLARY: GLUCOSE-CAPILLARY: 150 mg/dL — AB (ref 65–99)

## 2017-08-10 ENCOUNTER — Encounter (HOSPITAL_COMMUNITY)
Admission: RE | Admit: 2017-08-10 | Discharge: 2017-08-10 | Disposition: A | Discharge status: Home / Self Care | Payer: BLUE CROSS/BLUE SHIELD | Source: Ambulatory Visit | Attending: Cardiovascular Disease | Admitting: Cardiovascular Disease

## 2017-08-10 DIAGNOSIS — Z951 Presence of aortocoronary bypass graft: Secondary | ICD-10-CM

## 2017-08-10 DIAGNOSIS — Z48812 Encounter for surgical aftercare following surgery on the circulatory system: Secondary | ICD-10-CM | POA: Diagnosis not present

## 2017-08-10 LAB — GLUCOSE, CAPILLARY
GLUCOSE-CAPILLARY: 91 mg/dL (ref 65–99)
Glucose-Capillary: 85 mg/dL (ref 65–99)

## 2017-08-12 ENCOUNTER — Encounter (HOSPITAL_COMMUNITY)
Admission: RE | Admit: 2017-08-12 | Discharge: 2017-08-12 | Disposition: A | Discharge status: Home / Self Care | Payer: BLUE CROSS/BLUE SHIELD | Source: Ambulatory Visit | Attending: Cardiovascular Disease | Admitting: Cardiovascular Disease

## 2017-08-12 ENCOUNTER — Encounter (HOSPITAL_COMMUNITY): Payer: BLUE CROSS/BLUE SHIELD

## 2017-08-12 DIAGNOSIS — Z48812 Encounter for surgical aftercare following surgery on the circulatory system: Secondary | ICD-10-CM | POA: Diagnosis not present

## 2017-08-12 DIAGNOSIS — Z951 Presence of aortocoronary bypass graft: Secondary | ICD-10-CM

## 2017-08-13 ENCOUNTER — Telehealth: Payer: Self-pay | Admitting: *Deleted

## 2017-08-13 MED ORDER — ATORVASTATIN CALCIUM 80 MG PO TABS: 80.0000 mg | ORAL_TABLET | Freq: Every day | ORAL | 2 refills | Status: DC

## 2017-08-13 NOTE — Telephone Encounter (Signed)
Left detailed message on machine, ok per [facility]

## 2017-08-13 NOTE — Telephone Encounter (Signed)
-----   Message from Chilton Si, MD sent at 08/13/2017  6:03 PM EDT ----- Regarding: RE: Pt out of Atorvastatin OK we will take care of this.  He should be on 80mg  daily.    ----- Message ----- From: Jacques Earthly, RD Sent: 08/13/2017  11:44 AM To: Chilton Si, MD, Cammy Copa, RN, # Subject: Pt out of Atorvastatin                         Pt reports he was taking 10 mg Atorvastatin prior to recent hospitalization.   Pt was ordered 80 mg Atorvastatin during hospitalization.   Pt states he only has an RX for 10 mg Atorvastatin.   Pt has been out of his statin x 1 week due to need for updated RX at his pharmacy.   Please send in an RX for 80 mg for Atorvastatin to pt's pharmacy if you would like him to continue taking 80 mg dose.   Please advise prn. Thank you!

## 2017-08-14 ENCOUNTER — Encounter (HOSPITAL_COMMUNITY)
Admission: RE | Admit: 2017-08-14 | Discharge: 2017-08-14 | Disposition: A | Discharge status: Home / Self Care | Payer: BLUE CROSS/BLUE SHIELD | Source: Ambulatory Visit | Attending: Cardiovascular Disease | Admitting: Cardiovascular Disease

## 2017-08-14 DIAGNOSIS — Z951 Presence of aortocoronary bypass graft: Secondary | ICD-10-CM

## 2017-08-14 DIAGNOSIS — Z48812 Encounter for surgical aftercare following surgery on the circulatory system: Secondary | ICD-10-CM | POA: Diagnosis not present

## 2017-08-14 NOTE — Progress Notes (Signed)
Reviewed home exercise with pt today.  Pt plans to walk at home and at [employer] for exercise.  Reviewed THR, pulse, RPE, sign and symptoms, NTG use, and when to call 911 or MD.  Also discussed weather considerations and indoor options.  Pt voiced understanding.

## 2017-08-17 ENCOUNTER — Encounter (HOSPITAL_COMMUNITY)
Admission: RE | Admit: 2017-08-17 | Discharge: 2017-08-17 | Disposition: A | Discharge status: Home / Self Care | Payer: BLUE CROSS/BLUE SHIELD | Source: Ambulatory Visit | Attending: Cardiovascular Disease | Admitting: Cardiovascular Disease

## 2017-08-17 DIAGNOSIS — Z48812 Encounter for surgical aftercare following surgery on the circulatory system: Secondary | ICD-10-CM | POA: Diagnosis not present

## 2017-08-17 DIAGNOSIS — Z951 Presence of aortocoronary bypass graft: Secondary | ICD-10-CM

## 2017-08-17 LAB — GLUCOSE, CAPILLARY
GLUCOSE-CAPILLARY: 95 mg/dL (ref 65–99)
Glucose-Capillary: 116 mg/dL — ABNORMAL HIGH (ref 65–99)

## 2017-08-19 ENCOUNTER — Encounter (HOSPITAL_COMMUNITY): Admission: RE | Admit: 2017-08-19 | Payer: BLUE CROSS/BLUE SHIELD | Source: Ambulatory Visit

## 2017-08-21 ENCOUNTER — Encounter (HOSPITAL_COMMUNITY): Payer: BLUE CROSS/BLUE SHIELD

## 2017-08-26 ENCOUNTER — Encounter (HOSPITAL_COMMUNITY)
Admission: RE | Admit: 2017-08-26 | Discharge: 2017-08-26 | Disposition: A | Discharge status: Home / Self Care | Payer: BLUE CROSS/BLUE SHIELD | Source: Ambulatory Visit | Attending: Cardiovascular Disease | Admitting: Cardiovascular Disease

## 2017-08-26 DIAGNOSIS — Z48812 Encounter for surgical aftercare following surgery on the circulatory system: Secondary | ICD-10-CM | POA: Insufficient documentation

## 2017-08-26 DIAGNOSIS — Z951 Presence of aortocoronary bypass graft: Secondary | ICD-10-CM | POA: Diagnosis not present

## 2017-08-26 LAB — GLUCOSE, CAPILLARY: GLUCOSE-CAPILLARY: 118 mg/dL — AB (ref 65–99)

## 2017-08-28 ENCOUNTER — Encounter (HOSPITAL_COMMUNITY)
Admission: RE | Admit: 2017-08-28 | Discharge: 2017-08-28 | Disposition: A | Discharge status: Home / Self Care | Payer: BLUE CROSS/BLUE SHIELD | Source: Ambulatory Visit | Attending: Cardiovascular Disease | Admitting: Cardiovascular Disease

## 2017-08-28 DIAGNOSIS — Z48812 Encounter for surgical aftercare following surgery on the circulatory system: Secondary | ICD-10-CM | POA: Diagnosis not present

## 2017-08-28 DIAGNOSIS — Z951 Presence of aortocoronary bypass graft: Secondary | ICD-10-CM

## 2017-08-28 LAB — GLUCOSE, CAPILLARY
GLUCOSE-CAPILLARY: 95 mg/dL (ref 65–99)
Glucose-Capillary: 136 mg/dL — ABNORMAL HIGH (ref 65–99)

## 2017-08-31 ENCOUNTER — Encounter (HOSPITAL_COMMUNITY): Payer: BLUE CROSS/BLUE SHIELD

## 2017-09-02 ENCOUNTER — Encounter (HOSPITAL_COMMUNITY)
Admission: RE | Admit: 2017-09-02 | Discharge: 2017-09-02 | Disposition: A | Discharge status: Home / Self Care | Payer: BLUE CROSS/BLUE SHIELD | Source: Ambulatory Visit | Attending: Cardiovascular Disease | Admitting: Cardiovascular Disease

## 2017-09-02 ENCOUNTER — Ambulatory Visit (INDEPENDENT_AMBULATORY_CARE_PROVIDER_SITE_OTHER): Payer: BLUE CROSS/BLUE SHIELD | Admitting: Cardiovascular Disease

## 2017-09-02 ENCOUNTER — Encounter: Payer: Self-pay | Admitting: Cardiovascular Disease

## 2017-09-02 VITALS — BP 118/73 | HR 71 | Ht 75.0 in | Wt 294.8 lb

## 2017-09-02 DIAGNOSIS — I10 Essential (primary) hypertension: Secondary | ICD-10-CM

## 2017-09-02 DIAGNOSIS — Z951 Presence of aortocoronary bypass graft: Secondary | ICD-10-CM

## 2017-09-02 DIAGNOSIS — E7800 Pure hypercholesterolemia, unspecified: Secondary | ICD-10-CM | POA: Diagnosis not present

## 2017-09-02 DIAGNOSIS — Z48812 Encounter for surgical aftercare following surgery on the circulatory system: Secondary | ICD-10-CM | POA: Diagnosis not present

## 2017-09-02 LAB — GLUCOSE, CAPILLARY
GLUCOSE-CAPILLARY: 110 mg/dL — AB (ref 65–99)
GLUCOSE-CAPILLARY: 86 mg/dL (ref 65–99)

## 2017-09-02 NOTE — Progress Notes (Signed)
Cardiac Individual Treatment Plan  Patient Details  Name: [name] Hammond MRN: [id number] Date of Birth: April 09, 1961 Referring Provider:     CARDIAC REHAB PHASE II ORIENTATION from 07/21/2017 in [facility] CARDIAC REHAB  Referring Provider  Chilton Si MD      Initial Encounter Date:    CARDIAC REHAB PHASE II ORIENTATION from 07/21/2017 in [facility] CARDIAC REHAB  Date  07/21/17  Referring Provider  Chilton Si MD      Visit Diagnosis: 05/13/17 S/P CABG (coronary artery bypass graft)  Patient's Home Medications on Admission:  Current Outpatient Prescriptions:  .  aspirin 325 MG EC tablet, Take 1 tablet (325 mg total) by mouth daily., Disp: , Rfl:  .  atorvastatin (LIPITOR) 80 MG tablet, Take 1 tablet (80 mg total) by mouth at bedtime., Disp: 90 tablet, Rfl: 2 .  levothyroxine (SYNTHROID, LEVOTHROID) 100 MCG tablet, Take 100 mcg by mouth daily before breakfast., Disp: , Rfl:  .  lisinopril-hydrochlorothiazide (PRINZIDE,ZESTORETIC) 20-12.5 MG tablet, Take 1 tablet by mouth daily at 12 noon. , Disp: , Rfl:  .  metoprolol tartrate (LOPRESSOR) 25 MG tablet, Take 0.5 tablets (12.5 mg total) by mouth 2 (two) times daily., Disp: 60 tablet, Rfl: 1 .  Saxagliptin-Metformin (KOMBIGLYZE XR) 04-999 MG TB24, Take 1 tablet by mouth daily with breakfast. , Disp: , Rfl:  .  sertraline (ZOLOFT) 50 MG tablet, Take 50 mg by mouth every evening. , Disp: , Rfl:   Past Medical History: Past Medical History:  Diagnosis Date  . Anxiety   . Diabetes mellitus without complication (HCC)   . Hypertension   . Hypothyroid   . Obstructive sleep apnea   . Pure hypercholesterolemia   . PVD (peripheral vascular disease) (HCC)   . Testicular hypofunction   . Vitamin D deficiency     Tobacco Use: History  Smoking Status  . Never Smoker  Smokeless Tobacco  . Never Used    Labs: Recent Review Flowsheet Data    Labs for ITP Cardiac and Pulmonary Rehab Latest  Ref Rng & Units 05/12/2017 05/12/2017 05/12/2017 05/12/2017 05/13/2017   Cholestrol 0 - 200 mg/dL - - - - -   LDLCALC 0 - 99 mg/dL - - - - -   HDL >40 mg/dL - - - - -   Trlycerides <150 mg/dL - - - - -   Hemoglobin A1c 4.8 - 5.6 % - - - - -   PHART 7.350 - 7.450 7.306(L) 7.310(L) - 7.319(L) 7.347(L)   PCO2ART 32.0 - 48.0 mmHg 46.2 47.7 - 44.5 44.4   HCO3 20.0 - 28.0 mmol/L 23.1 24.0 - 22.8 23.7   TCO2 0 - 100 mmol/L 25 25 25 24 25    ACIDBASEDEF 0.0 - 2.0 mmol/L 3.0(H) 2.0 - 3.0(H) 1.2   O2SAT % 97.0 99.0 - 95.0 93.6      Capillary Blood Glucose: Lab Results  Component Value Date   GLUCAP 110 (H) 09/02/2017   GLUCAP 86 09/02/2017   GLUCAP 136 (H) 08/28/2017   GLUCAP 95 08/28/2017   GLUCAP 118 (H) 08/26/2017     Exercise Target Goals:    Exercise Program Goal: Individual exercise prescription set with THRR, safety & activity barriers. Participant demonstrates ability to understand and report RPE using BORG scale, to self-measure pulse accurately, and to acknowledge the importance of the exercise prescription.  Exercise Prescription Goal: Starting with aerobic activity 30 plus minutes a day, 3 days per week for initial exercise prescription. Provide home exercise  prescription and guidelines that participant acknowledges understanding prior to discharge.  Activity Barriers & Risk Stratification:     Activity Barriers & Cardiac Risk Stratification - 07/21/17 1440      Activity Barriers & Cardiac Risk Stratification   Activity Barriers Muscular Weakness;Deconditioning   Cardiac Risk Stratification High      6 Minute Walk:     6 Minute Walk    Row Name 07/21/17 1557         6 Minute Walk   Phase Initial     Distance 1692 feet     Walk Time 6 minutes     # of Rest Breaks 0     MPH 3.2     METS 4.08     RPE 13     VO2 Peak 14.29     Resting HR 71 bpm     Resting BP 122/64     Max Ex. HR 128 bpm     Max Ex. BP 140/70     2 Minute Post BP 114/82        Oxygen  Initial Assessment:   Oxygen Re-Evaluation:   Oxygen Discharge (Final Oxygen Re-Evaluation):   Initial Exercise Prescription:     Initial Exercise Prescription - 07/21/17 1600      Date of Initial Exercise RX and Referring Provider   Date 07/21/17   Referring Provider Chilton Si MD     Treadmill   MPH 3   Grade 0   Minutes 10   METs 3.3     Bike   Level 1   Minutes 10   METs 2.4     NuStep   Level 3   SPM 80   Minutes 10   METs 2     Prescription Details   Frequency (times per week) 3   Duration Progress to 30 minutes of continuous aerobic without signs/symptoms of physical distress     Intensity   THRR 40-80% of Max Heartrate 66-132   Ratings of Perceived Exertion 11-13   Perceived Dyspnea 0-4     Progression   Progression Continue progressive overload as per policy without signs/symptoms or physical distress.     Resistance Training   Training Prescription Yes   Weight 3lbs   Reps 10-15      Perform Capillary Blood Glucose checks as needed.  Exercise Prescription Changes:     Exercise Prescription Changes    Row Name 07/27/17 1502 08/05/17 1500 08/17/17 1453 09/01/17 1400       Response to Exercise   Blood Pressure (Admit) 104/74 110/70 108/70 110/80    Blood Pressure (Exercise) 168/80 158/80 130/80 142/82    Blood Pressure (Exit) 130/86 118/70 120/70 116/70    Heart Rate (Admit) 86 bpm 80 bpm 97 bpm 78 bpm    Heart Rate (Exercise) 129 bpm 108 bpm 138 bpm 120 bpm    Heart Rate (Exit) 78 bpm 69 bpm 95 bpm 84 bpm    Rating of Perceived Exertion (Exercise) Symptoms none none none none    Comments pt was oriented to exercise equipment  -  -  -    Duration Continue with 30 min of aerobic exercise without signs/symptoms of physical distress. Continue with 30 min of aerobic exercise without signs/symptoms of physical distress. Continue with 30 min of aerobic exercise without signs/symptoms of physical distress. Continue with 30  min of aerobic exercise without signs/symptoms of physical distress.    Intensity THRR  unchanged THRR unchanged THRR unchanged THRR unchanged      Progression   Progression Continue to progress workloads to maintain intensity without signs/symptoms of physical distress. Continue to progress workloads to maintain intensity without signs/symptoms of physical distress. Continue to progress workloads to maintain intensity without signs/symptoms of physical distress. Continue to progress workloads to maintain intensity without signs/symptoms of physical distress.    Average METs 2.6 3.1 3.7 3.1      Resistance Training   Training Prescription Yes Yes Yes Yes    Weight 3lbs 4lbs 5lbs 5lbs    Reps 10-15 10-15 10-15 10-15    Time 10 Minutes 10 Minutes 10 Minutes 10 Minutes      Treadmill   MPH 3  - 3  -    Grade 0  - 0  -    Minutes 10  - 15  -    METs 3.3  - 3.3  -      Bike   Level  - 1  - 1.5    Minutes  - 10  - 15    METs  - 2.4  - 3.11      NuStep   Level 3 4 4 4     SPM 80 80 100 80    Minutes 10 10 15 15     METs 2.1 2.6 4.1 3.1      Home Exercise Plan   Plans to continue exercise at  -  - [employer] (comment)  [employer] (comment)  [employer]    Frequency  -  - Add 2 additional days to program exercise sessions. Add 2 additional days to program exercise sessions.    Initial Home Exercises Provided  -  - 08/14/17 08/14/17       Exercise Comments:     Exercise Comments    Row Name 07/27/17 1517 08/14/17 1434 08/28/17 1628       Exercise Comments Pt was oriented to exercise equipment today. Pt responded well to exercise session; will continue to monitor actvity levels. home exercise completed Reviewed METs and goals. Pt is progressing well in cardiac rehab; will continue to monitor pt's progress/activity levels         Exercise Goals and Review:     Exercise Goals    Row Name 07/21/17 1440             Exercise Goals   Increase  Physical Activity Yes       Intervention Provide advice, education, support and counseling about physical activity/exercise needs.;Develop an individualized exercise prescription for aerobic and resistive training based on initial evaluation findings, risk stratification, comorbidities and participant's personal goals.       Expected Outcomes Achievement of increased cardiorespiratory fitness and enhanced flexibility, muscular endurance and strength shown through measurements of functional capacity and personal statement of participant.       Increase Strength and Stamina Yes       Intervention Provide advice, education, support and counseling about physical activity/exercise needs.;Develop an individualized exercise prescription for aerobic and resistive training based on initial evaluation findings, risk stratification, comorbidities and participant's personal goals.       Expected Outcomes Achievement of increased cardiorespiratory fitness and enhanced flexibility, muscular endurance and strength shown through measurements of functional capacity and personal statement of participant.          Exercise Goals Re-Evaluation :     Exercise Goals Re-Evaluation    Row Name 08/28/17 1627 08/28/17 1631  Exercise Goal Re-Evaluation   Exercise Goals Review Increase Physical Activity;Able to understand and use rate of perceived exertion (RPE) scale;Knowledge and understanding of Target Heart Rate Range (THRR);Understanding of Exercise Prescription;Able to check pulse independently;Increase Strength and Stamina  -      Comments Pt tolerates WL increases very well. Pt is also very compliant with HEP and goes to [employer] 2x/week in addition to coming to cardiac rehab. Pt tolerates WL increases very well. Pt is also very compliant with HEP and goes to [employer] 2x/week in addition to coming to cardiac rehab. Pt walks on TM for 15' and uses the ellipitcal for 15' at the gym      Expected  Outcomes Pt will continue be compliant with HEP and improve in aerobic capacity.   -          Discharge Exercise Prescription (Final Exercise Prescription Changes):     Exercise Prescription Changes - 09/01/17 1400      Response to Exercise   Blood Pressure (Admit) 110/80   Blood Pressure (Exercise) 142/82   Blood Pressure (Exit) 116/70   Heart Rate (Admit) 78 bpm   Heart Rate (Exercise) 120 bpm   Heart Rate (Exit) 84 bpm   Rating of Perceived Exertion (Exercise) 13   Symptoms none   Duration Continue with 30 min of aerobic exercise without signs/symptoms of physical distress.   Intensity THRR unchanged     Progression   Progression Continue to progress workloads to maintain intensity without signs/symptoms of physical distress.   Average METs 3.1     Resistance Training   Training Prescription Yes   Weight 5lbs   Reps 10-15   Time 10 Minutes     Bike   Level 1.5   Minutes 15   METs 3.11     NuStep   Level 4   SPM 80   Minutes 15   METs 3.1     Home Exercise Plan   Plans to continue exercise at [employer] (comment)  [employer]   Frequency Add 2 additional days to program exercise sessions.   Initial Home Exercises Provided 08/14/17      Nutrition:  Target Goals: Understanding of nutrition guidelines, daily intake of sodium 1500mg , cholesterol 200mg , calories 30% from fat and 7% or less from saturated fats, daily to have 5 or more servings of fruits and vegetables.  Biometrics:     Pre Biometrics - 07/21/17 1555      Pre Biometrics   Waist Circumference 44.75 inches   Hip Circumference 50 inches   Waist to Hip Ratio 0.9 %   Triceps Skinfold 31 mm   % Body Fat 35.3 %   Grip Strength 38 kg   Flexibility 10 in   Single Leg Stand 25 seconds       Nutrition Therapy Plan and Nutrition Goals:     Nutrition Therapy & Goals - 07/21/17 1602      Nutrition Therapy   Diet Carb Modified, Therapeutic Lifestyle Changes     Personal  Nutrition Goals   Nutrition Goal Wt loss of 1-2 lb/week to a wt loss goal of 6-24 lb at graduation from Cardiac Rehab.      Intervention Plan   Intervention Prescribe, educate and counsel regarding individualized specific dietary modifications aiming towards targeted core components such as weight, hypertension, lipid management, diabetes, heart failure and other comorbidities.   Expected Outcomes Short Term Goal: Understand basic principles of dietary content, such as calories, fat, sodium,  cholesterol and nutrients.;Long Term Goal: Adherence to prescribed nutrition plan.      Nutrition Discharge: Nutrition Scores:     Nutrition Assessments - 07/21/17 1602      MEDFICTS Scores   Pre Score 39      Nutrition Goals Re-Evaluation:   Nutrition Goals Re-Evaluation:   Nutrition Goals Discharge (Final Nutrition Goals Re-Evaluation):   Psychosocial: Target Goals: Acknowledge presence or absence of significant depression and/or stress, maximize coping skills, provide positive support system. Participant is able to verbalize types and ability to use techniques and skills needed for reducing stress and depression.  Initial Review & Psychosocial Screening:     Initial Psych Review & Screening - 07/27/17 1627      Barriers   Psychosocial barriers to participate in program The patient should benefit from training in stress management and relaxation.     Screening Interventions   Interventions Encouraged to exercise;Program counselor consult;Provide feedback about the scores to participant      Quality of Life Scores:   PHQ-9: Recent Review Flowsheet Data    Depression screen [facility] 2/9 07/27/2017   Decreased Interest 1   Down, Depressed, Hopeless 1   PHQ - 2 Score 2   Altered sleeping 1   Tired, decreased energy 1   Change in appetite 1   Feeling bad or failure about yourself  1   Trouble concentrating 0   Moving slowly or fidgety/restless 0   Suicidal thoughts 0   PHQ-9  Score 6   Difficult doing work/chores Somewhat difficult     Interpretation of Total Score  Total Score Depression Severity:  1-4 = Minimal depression, 5-9 = Mild depression, 10-14 = Moderate depression, 15-19 = Moderately severe depression, 20-27 = Severe depression   Psychosocial Evaluation and Intervention:     Psychosocial Evaluation - 07/27/17 1627      Psychosocial Evaluation & Interventions   Interventions Stress management education;Relaxation education;Encouraged to exercise with the program and follow exercise prescription   Comments pt with known history of depression,currently treated wtih zoloft by PCP. at pt request, appt will be scheduled with Theda Belfast.    Expected Outcomes pt will demonstrate improved outlook with positive coping skills   Continue Psychosocial Services  Follow up required by staff      Psychosocial Re-Evaluation:     Psychosocial Re-Evaluation    Row Name 08/06/17 1136 09/02/17 1648           Psychosocial Re-Evaluation   Current issues with None Identified;Current Stress Concerns;Current Anxiety/Panic;Current Depression None Identified;Current Stress Concerns;Current Anxiety/Panic;Current Depression      Comments pt with health related stress/anxiety and depression. appt will be made with Theda Belfast for counseling.   pt with health related stress/anxiety and depression. appt will be made with Theda Belfast for counseling.        Expected Outcomes pt will exhibit positive outlook with good coping skills.  pt will exhibit positive outlook with good coping skills.       Interventions Encouraged to attend Cardiac Rehabilitation for the exercise Encouraged to attend Cardiac Rehabilitation for the exercise      Continue Psychosocial Services  Follow up required by staff Follow up required by staff      Comments per nursing assessment pt reports medium level of depression and anxiety.  Rates low level of stress "everyday" per nursing assessment pt  reports medium level of depression and anxiety.  Rates low level of stress "everyday"        Initial  Review   Source of Stress Concerns Chronic Illness;Poor Coping Skills;Unable to participate in former interests or hobbies Chronic Illness;Poor Coping Skills;Unable to participate in former interests or hobbies         Psychosocial Discharge (Final Psychosocial Re-Evaluation):     Psychosocial Re-Evaluation - 09/02/17 1648      Psychosocial Re-Evaluation   Current issues with None Identified;Current Stress Concerns;Current Anxiety/Panic;Current Depression   Comments pt with health related stress/anxiety and depression. appt will be made with Theda Belfast for counseling.     Expected Outcomes pt will exhibit positive outlook with good coping skills.    Interventions Encouraged to attend Cardiac Rehabilitation for the exercise   Continue Psychosocial Services  Follow up required by staff   Comments per nursing assessment pt reports medium level of depression and anxiety.  Rates low level of stress "everyday"     Initial Review   Source of Stress Concerns Chronic Illness;Poor Coping Skills;Unable to participate in former interests or hobbies      Vocational Rehabilitation: Provide vocational rehab assistance to qualifying candidates.   Vocational Rehab Evaluation & Intervention:   Education: Education Goals: Education classes will be provided on a weekly basis, covering required topics. Participant will state understanding/return demonstration of topics presented.  Learning Barriers/Preferences:     Learning Barriers/Preferences - 07/21/17 1439      Learning Barriers/Preferences   Learning Barriers Sight   Learning Preferences Skilled Demonstration;Verbal Instruction      Education Topics: Count Your Pulse:  -Group instruction provided by verbal instruction, demonstration, patient participation and written materials to support subject.  Instructors address importance of  being able to find your pulse and how to count your pulse when at home without a heart monitor.  Patients get hands on experience counting their pulse with staff help and individually.   CARDIAC REHAB PHASE II EXERCISE from 08/28/2017 in [facility] CARDIAC REHAB  Date  08/28/17  Instruction Review Code  2- meets goals/outcomes      Heart Attack, Angina, and Risk Factor Modification:  -Group instruction provided by verbal instruction, video, and written materials to support subject.  Instructors address signs and symptoms of angina and heart attacks.    Also discuss risk factors for heart disease and how to make changes to improve heart health risk factors.   Functional Fitness:  -Group instruction provided by verbal instruction, demonstration, patient participation, and written materials to support subject.  Instructors address safety measures for doing things around the house.  Discuss how to get up and down off the floor, how to pick things up properly, how to safely get out of a chair without assistance, and balance training.   CARDIAC REHAB PHASE II EXERCISE from 08/28/2017 in [facility] CARDIAC REHAB  Date  08/07/17  Instruction Review Code  2- meets goals/outcomes      Meditation and Mindfulness:  -Group instruction provided by verbal instruction, patient participation, and written materials to support subject.  Instructor addresses importance of mindfulness and meditation practice to help reduce stress and improve awareness.  Instructor also leads participants through a meditation exercise.    CARDIAC REHAB PHASE II EXERCISE from 08/28/2017 in [facility] CARDIAC REHAB  Date  08/05/17  Instruction Review Code  2- meets goals/outcomes      Stretching for Flexibility and Mobility:  -Group instruction provided by verbal instruction, patient participation, and written materials to support subject.  Instructors lead participants through  series of stretches that are  designed to increase flexibility thus improving mobility.  These stretches are additional exercise for major muscle groups that are typically performed during regular warm up and cool down.   Hands Only CPR:  -Group verbal, video, and participation provides a basic overview of AHA guidelines for community CPR. Role-play of emergencies allow participants the opportunity to practice calling for help and chest compression technique with discussion of AED use.   Hypertension: -Group verbal and written instruction that provides a basic overview of hypertension including the most recent diagnostic guidelines, risk factor reduction with self-care instructions and medication management.    Nutrition I class: Heart Healthy Eating:  -Group instruction provided by PowerPoint slides, verbal discussion, and written materials to support subject matter. The instructor gives an explanation and review of the Therapeutic Lifestyle Changes diet recommendations, which includes a discussion on lipid goals, dietary fat, sodium, fiber, plant stanol/sterol esters, sugar, and the components of a well-balanced, healthy diet.   CARDIAC REHAB PHASE II EXERCISE from 08/28/2017 in [facility] CARDIAC REHAB  Date  07/21/17  Educator  RD  Instruction Review Code  Not applicable      Nutrition II class: Lifestyle Skills:  -Group instruction provided by PowerPoint slides, verbal discussion, and written materials to support subject matter. The instructor gives an explanation and review of label reading, grocery shopping for heart health, heart healthy recipe modifications, and ways to make healthier choices when eating out.   CARDIAC REHAB PHASE II EXERCISE from 08/28/2017 in [facility] CARDIAC REHAB  Date  07/21/17  Educator  RD  Instruction Review Code  Not applicable      Diabetes Question & Answer:  -Group instruction provided by PowerPoint slides, verbal  discussion, and written materials to support subject matter. The instructor gives an explanation and review of diabetes co-morbidities, pre- and post-prandial blood glucose goals, pre-exercise blood glucose goals, signs, symptoms, and treatment of hypoglycemia and hyperglycemia, and foot care basics.   Diabetes Blitz:  -Group instruction provided by PowerPoint slides, verbal discussion, and written materials to support subject matter. The instructor gives an explanation and review of the physiology behind type 1 and type 2 diabetes, diabetes medications and rational behind using different medications, pre- and post-prandial blood glucose recommendations and Hemoglobin A1c goals, diabetes diet, and exercise including blood glucose guidelines for exercising safely.    CARDIAC REHAB PHASE II EXERCISE from 08/28/2017 in [facility] CARDIAC REHAB  Date  07/21/17  Educator  RD  Instruction Review Code  Not applicable      Portion Distortion:  -Group instruction provided by PowerPoint slides, verbal discussion, written materials, and food models to support subject matter. The instructor gives an explanation of serving size versus portion size, changes in portions sizes over the last 20 years, and what consists of a serving from each food group.   CARDIAC REHAB PHASE II EXERCISE from 08/28/2017 in [facility] CARDIAC REHAB  Date  08/12/17  Educator  RD  Instruction Review Code  2- meets goals/outcomes      Stress Management:  -Group instruction provided by verbal instruction, video, and written materials to support subject matter.  Instructors review role of stress in heart disease and how to cope with stress positively.     Exercising on Your Own:  -Group instruction provided by verbal instruction, power point, and written materials to support subject.  Instructors discuss benefits of exercise, components of exercise, frequency and intensity of exercise, and end  points  for exercise.  Also discuss use of nitroglycerin and activating EMS.  Review options of places to exercise outside of rehab.  Review guidelines for sex with heart disease.   CARDIAC REHAB PHASE II EXERCISE from 08/28/2017 in [facility] CARDIAC REHAB  Date  08/26/17  Educator  EP  Instruction Review Code  2- meets goals/outcomes      Cardiac Drugs I:  -Group instruction provided by verbal instruction and written materials to support subject.  Instructor reviews cardiac drug classes: antiplatelets, anticoagulants, beta blockers, and statins.  Instructor discusses reasons, side effects, and lifestyle considerations for each drug class.   CARDIAC REHAB PHASE II EXERCISE from 08/28/2017 in [facility] CARDIAC REHAB  Date  07/29/17  Instruction Review Code  2- meets goals/outcomes      Cardiac Drugs II:  -Group instruction provided by verbal instruction and written materials to support subject.  Instructor reviews cardiac drug classes: angiotensin converting enzyme inhibitors (ACE-I), angiotensin II receptor blockers (ARBs), nitrates, and calcium channel blockers.  Instructor discusses reasons, side effects, and lifestyle considerations for each drug class.   Anatomy and Physiology of the Circulatory System:  Group verbal and written instruction and models provide basic cardiac anatomy and physiology, with the coronary electrical and arterial systems. Review of: AMI, Angina, Valve disease, Heart Failure, Peripheral Artery Disease, Cardiac Arrhythmia, Pacemakers, and the ICD.   Other Education:  -Group or individual verbal, written, or video instructions that support the educational goals of the cardiac rehab program.   Knowledge Questionnaire Score:     Knowledge Questionnaire Score - 07/21/17 1617      Knowledge Questionnaire Score   Pre Score 21/24      Core Components/Risk Factors/Patient Goals at Admission:     Personal Goals and Risk  Factors at Admission - 07/21/17 1443      Core Components/Risk Factors/Patient Goals on Admission    Weight Management Yes;Weight Loss;Weight Maintenance;Obesity   Intervention Weight Management: Develop a combined nutrition and exercise program designed to reach desired caloric intake, while maintaining appropriate intake of nutrient and fiber, sodium and fats, and appropriate energy expenditure required for the weight goal.;Weight Management: Provide education and appropriate resources to help participant work on and attain dietary goals.;Weight Management/Obesity: Establish reasonable short term and long term weight goals.;Obesity: Provide education and appropriate resources to help participant work on and attain dietary goals.   Expected Outcomes Short Term: Continue to assess and modify interventions until short term weight is achieved;Weight Maintenance: Understanding of the daily nutrition guidelines, which includes 25-35% calories from fat, 7% or less cal from saturated fats, less than 200mg  cholesterol, less than 1.5gm of sodium, & 5 or more servings of fruits and vegetables daily;Long Term: Adherence to nutrition and physical activity/exercise program aimed toward attainment of established weight goal;Weight Loss: Understanding of general recommendations for a balanced deficit meal plan, which promotes 1-2 lb weight loss per week and includes a negative energy balance of [phone] kcal/d;Understanding recommendations for meals to include 15-35% energy as protein, 25-35% energy from fat, 35-60% energy from carbohydrates, less than 200mg  of dietary cholesterol, 20-35 gm of total fiber daily;Understanding of distribution of calorie intake throughout the day with the consumption of 4-5 meals/snacks;Weight Gain: Understanding of general recommendations for a high calorie, high protein meal plan that promotes weight gain by distributing calorie intake throughout the day with the consumption for 4-5 meals,  snacks, and/or supplements   Diabetes Yes   Intervention Provide education about signs/symptoms and action  to take for hypo/hyperglycemia.;Provide education about proper nutrition, including hydration, and aerobic/resistive exercise prescription along with prescribed medications to achieve blood glucose in normal ranges: Fasting glucose 65-99 mg/dL   Expected Outcomes Short Term: Participant verbalizes understanding of the signs/symptoms and immediate care of hyper/hypoglycemia, proper foot care and importance of medication, aerobic/resistive exercise and nutrition plan for blood glucose control.;Long Term: Attainment of HbA1C < 7%.   Hypertension Yes   Intervention Provide education on lifestyle modifcations including regular physical activity/exercise, weight management, moderate sodium restriction and increased consumption of fresh fruit, vegetables, and low fat dairy, alcohol moderation, and smoking cessation.;Monitor prescription use compliance.   Expected Outcomes Short Term: Continued assessment and intervention until BP is < 140/73mm HG in hypertensive participants. < 130/81mm HG in hypertensive participants with diabetes, heart failure or chronic kidney disease.;Long Term: Maintenance of blood pressure at goal levels.   Lipids Yes   Intervention Provide education and support for participant on nutrition & aerobic/resistive exercise along with prescribed medications to achieve LDL 70mg , HDL >40mg .   Expected Outcomes Short Term: Participant states understanding of desired cholesterol values and is compliant with medications prescribed. Participant is following exercise prescription and nutrition guidelines.;Long Term: Cholesterol controlled with medications as prescribed, with individualized exercise RX and with personalized nutrition plan. Value goals: LDL < 70mg , HDL > 40 mg.   Personal Goal Other Yes   Personal Goal Decrease fear avoidance, learn exercise limitations and lose weight. Overall  goal is to be heart healthy   Intervention Provide cardiac education classes and exercise programming to assist with building confidence with actvity, and learn activity restrictions to promote a heart healthy lifestyle and    Expected Outcomes Pt will be able to lose wt, decrease fear avoidance and learn activity limitations.      Core Components/Risk Factors/Patient Goals Review:      Goals and Risk Factor Review    Row Name 08/06/17 1006 09/02/17 1648           Core Components/Risk Factors/Patient Goals Review   Personal Goals Review Weight Management/Obesity;Hypertension;Lipids;Diabetes;Other Weight Management/Obesity;Hypertension;Lipids;Diabetes;Other      Review pt with multiple CAD RF demonstrates eagerness to participate in CR activiites to decrease cardiac risk, fear and learn ways to improve health.   pt with multiple CAD RF demonstrates eagerness to participate in CR activiites to decrease cardiac risk, fear and learn ways to improve health.        Expected Outcomes pt will participate in CR exercise, nutrition and lifestyle edcucation opportunities to decrease overall CAD RF.  pt will participate in CR exercise, nutrition and lifestyle edcucation opportunities to decrease overall CAD RF.          Core Components/Risk Factors/Patient Goals at Discharge (Final Review):      Goals and Risk Factor Review - 09/02/17 1648      Core Components/Risk Factors/Patient Goals Review   Personal Goals Review Weight Management/Obesity;Hypertension;Lipids;Diabetes;Other   Review pt with multiple CAD RF demonstrates eagerness to participate in CR activiites to decrease cardiac risk, fear and learn ways to improve health.     Expected Outcomes pt will participate in CR exercise, nutrition and lifestyle edcucation opportunities to decrease overall CAD RF.       ITP Comments:     ITP Comments    Row Name 07/21/17 1421 08/06/17 1005 09/02/17 1647       ITP Comments Dr. Armanda Magic,  Medical Director Dr. Armanda Magic, Medical Director Dr. Armanda Magic, Medical Director  Comments: Pt is making expected progress toward personal goals after completing 12 sessions. Recommend continued exercise and life style modification education including  stress management and relaxation techniques to decrease cardiac risk profile.

## 2017-09-02 NOTE — Patient Instructions (Signed)
Medication Instructions:  Your physician recommends that you continue on your current medications as directed. Please refer to the Current Medication list given to you today.  Labwork: FASTING LP/CMET SOON   Testing/Procedures: NONE  Follow-Up: Your physician wants you to follow-up in: 6 MONTH OV  You will receive a reminder letter in the mail two months in advance. If you don't receive a letter, please call our office to schedule the follow-up appointment.  If you need a refill on your cardiac medications before your next appointment, please call your pharmacy.  

## 2017-09-02 NOTE — Progress Notes (Signed)
Cardiology Office Note   Date:  09/04/2017   ID:  [name] Hammond, DOB 1961/10/12, MRN [id number]  PCP:  [name], MD  Cardiologist:   Chilton Si, MD   No chief complaint on file.    History of Present Illness: [name] Hammond is a 56 y.o. male with CAD s/p CABG, hypertension, hypothyroidism and diabetes who is here for follow up.  He was first seen 03/2017 with chest pain.  He was referred for ETT 04/2017 that was positive for ischemia.  He then had a LHC with obstructive disease in the LAD and LCx territories.  Echo revealed LVEF 60-65% and mild LVH.  He underwent CABG (LIMA-->LAD, SVG-->D, SVG-->OM) on 05/13/17.  His surgery was complicated by a hematoma and bleeding from the leg vein graft removal site.  His procedure was otherwise uncomplicated. He followed up with Azalee Course, PA, on 05/2017. He has participated in cardiac rehabilitation and is somewhat frustrated by the fact that he cannot work out more strenuously yet.   He enjoys the cardio but wants to start lifting weights.  He denies chest pain or shortness of breath.  He continues to some numbness in his chest around the incision site. He sometimes also has a prickling sensation. He has no lower extremity edema, orthopnea, or PND. 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    Past Medical History:  Diagnosis Date  . Anxiety   . Diabetes mellitus without complication (HCC)   . Hypertension   . Hypothyroid   . Obstructive sleep apnea   . Pure hypercholesterolemia   . PVD (peripheral vascular disease) (HCC)   . Testicular hypofunction   . Vitamin D deficiency     Past Surgical History:  Procedure Laterality Date  . CORONARY ARTERY BYPASS GRAFT N/A 05/12/2017   Procedure: CORONARY ARTERY BYPASS GRAFTING (CABG) x three , using left inernal mammary artery and left leg     greater saphenous vein harvested endoscopically;  Surgeon: Kerin Perna, MD;  Location: [facility] OR;   Service: Open Heart Surgery;  Laterality: N/A;  . COSMETIC SURGERY    . INTRAVASCULAR ULTRASOUND/IVUS N/A 05/08/2017   Procedure: Intravascular Ultrasound/IVUS;  Surgeon: Kathleene Hazel, MD;  Location: [facility] INVASIVE CV LAB;  Service: Cardiovascular;  Laterality: N/A;  . LEFT HEART CATH AND CORONARY ANGIOGRAPHY N/A 05/08/2017   Procedure: Left Heart Cath and Coronary Angiography;  Surgeon: Kathleene Hazel, MD;  Location: [facility] INVASIVE CV LAB;  Service: Cardiovascular;  Laterality: N/A;  . TEE WITHOUT CARDIOVERSION N/A 05/12/2017   Procedure: TRANSESOPHAGEAL ECHOCARDIOGRAM (TEE);  Surgeon: Donata Clay, Theron Arista, MD;  Location: [facility] OR;  Service: Open Heart Surgery;  Laterality: N/A;     Current Outpatient Prescriptions  Medication Sig Dispense Refill  . aspirin 325 MG EC tablet Take 1 tablet (325 mg total) by mouth daily.    Marland Kitchen atorvastatin (LIPITOR) 80 MG tablet Take 1 tablet (80 mg total) by mouth at bedtime. 90 tablet 2  . levothyroxine (SYNTHROID, LEVOTHROID) 100 MCG tablet Take 100 mcg by mouth daily before breakfast.    . lisinopril-hydrochlorothiazide (PRINZIDE,ZESTORETIC) 20-12.5 MG tablet Take 1 tablet by mouth daily at 12 noon.     . metoprolol tartrate (LOPRESSOR) 25 MG tablet Take 0.5 tablets (12.5 mg total) by mouth 2 (two) times daily. 60 tablet 1  . Saxagliptin-Metformin (KOMBIGLYZE XR) 04-999 MG TB24 Take 1 tablet by mouth daily with breakfast.     .  sertraline (ZOLOFT) 50 MG tablet Take 50 mg by mouth every evening.      No current facility-administered medications for this visit.     Allergies:   No known allergies    Social History:  The patient  reports that he has never smoked. He has never used smokeless tobacco. He reports that he does not drink alcohol or use drugs.   Family History:  The patient's family history includes Cancer - Other in his father; Diabetes type II in his brother, brother, and mother; Heart disease in his father; Hypertension in his father;  Thyroid disease in his mother and sister.    ROS:  Please see the history of present illness.   Otherwise, review of systems are positive for none.   All other systems are reviewed and negative.    PHYSICAL EXAM: VS:  BP 118/73   Pulse 71   Ht  (1.905 m)   Wt 133.7 kg (294 lb 12.8 oz)   SpO2 95%   BMI 36.85 kg/m  , BMI Body mass index is 36.85 kg/m. GENERAL:  Well appearing HEENT: Pupils equal round and reactive, fundi not visualized, oral mucosa unremarkable NECK:  No jugular venous distention, waveform within normal limits, carotid upstroke brisk and symmetric, no bruits LUNGS:  Clear to auscultation bilaterally HEART:  RRR.  PMI not displaced or sustained,S1 and S2 within normal limits, no S3, no S4, no clicks, no rubs, no murmurs ABD:  Flat, positive bowel sounds normal in frequency in pitch, no bruits, no rebound, no guarding, no midline pulsatile mass, no hepatomegaly, no splenomegaly EXT:  2 plus pulses throughout, no edema, no cyanosis no clubbing SKIN:  No rashes no nodules NEURO:  Cranial nerves II through XII grossly intact, motor grossly intact throughout PSYCH:  Cognitively intact, oriented to person place and time   EKG:  EKG is not ordered today. The ekg ordered 04/13/17 demonstrates sinus rhythm.  Rate 64 bpm.  Echo 05/09/17:  Study Conclusions  - Left ventricle: The cavity size was normal. Wall thickness was   increased in a pattern of mild LVH. Systolic function was normal.   The estimated ejection fraction was in the range of 60% to 65%.   Wall motion was normal; there were no regional wall motion   abnormalities. Left ventricular diastolic function parameters   were normal. - Tricuspid valve: There was trivial regurgitation. - Pulmonary arteries: Systolic pressure could not be accurately   estimated. - Pericardium, extracardiac: There was no pericardial effusion.  Impressions:  - Mild LVH with LVEF 60-65% and normal diastolic function. Trivial    tricuspid regurgitation.  LHC 05/08/17:  Mid RCA lesion, 10 %stenosed.  Ost LM to LM lesion, 50 %stenosed.  Ost Cx to Prox Cx lesion, 60 %stenosed.  Ost LAD to Prox LAD lesion, 90 %stenosed.  LM lesion, 50 %stenosed.  3rd Mrg lesion, 30 %stenosed.  The left ventricular ejection fraction is greater than 65% by visual estimate.  The left ventricular systolic function is normal.  LV end diastolic pressure is normal.  There is no mitral valve regurgitation.  Ost 1st Diag to 1st Diag lesion, 50 %stenosed.   1. Severe ostial LAD stenosis with moderate stenosis in the distal left main artery and ostial Circumflex artery. This is confirmed with IVUS imaging.  2. Mild plaque in the mid RCA and third OM branch 3. Moderate stenosis in the first diagonal branch 4. Normal LV systolic function   Recent Labs: 05/09/2017: ALT 32; TSH  0.982 05/13/2017: Magnesium 2.3 05/15/2017: BUN 13; Creatinine, Ser 0.85; Potassium 3.8; Sodium 136 05/21/2017: Hemoglobin 9.1; Platelets 409   2/18:   TSH 2.3.  total cholesterol 139, triglyerides 73, HDL 39, LDL 85  WBC 8.3, hemoglon 16.114.8, hematcrit 42.9, atelets 278 Hemoglobin A1c 6.5  Sodium 139, potassium 4.1, BUN 10, creatinine 0.86  AST 30, ALT 37  Lipid Panel    Component Value Date/Time   CHOL 120 05/09/2017 0254   TRIG 94 05/09/2017 0254   HDL 35 (L) 05/09/2017 0254   CHOLHDL 3.4 05/09/2017 0254   VLDL 19 05/09/2017 0254   LDLCALC 66 05/09/2017 0254      Wt Readings from Last 3 Encounters:  09/03/17 133.7 kg (294 lb 12.8 oz)  09/02/17 133.7 kg (294 lb 12.8 oz)  07/21/17 133.7 kg (294 lb 12.1 oz)      ASSESSMENT AND PLAN:  # CAD s/p CABG: # Hyperlipidemia: [name] Hammond is doing well. He has no chest pain. He wants to start lifting weights. I will contact Dr. Maren BeachVanTrigt to get better guidance on his restrictions.  OK to return to work.  Continue aspirin, metoprolol and atorvastatin.  We will check lipids and a CMP given atorvastatin  was increased in the hospital.  # Hypertension: Blood pressure well controlled. Continue metoprolol, lisinopril, and hydrochlorothiazide.  # Morbid obesity:  [name] Hammond was encouraged to continue his exercise routine.    All medicines are reviewed at length with the patient today.  The patient does not have concerns regarding medicines.  The following changes have been made:  no change  Labs/ tests ordered today include:   Orders Placed This Encounter  Procedures  . Lipid panel  . Comprehensive metabolic panel     Disposition:   FU with [name] Hammond C. Duke Salviaandolph, MD, [facility] in 6 months.    This note was written with the assistance of speech recognition software.  Please excuse any transcriptional errors.  Signed, Terrace Chiem C. Duke Salviaandolph, MD, [facility]  09/04/2017 8:46 AM    [facility]
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
## 2017-09-03 ENCOUNTER — Ambulatory Visit (INDEPENDENT_AMBULATORY_CARE_PROVIDER_SITE_OTHER): Payer: BLUE CROSS/BLUE SHIELD | Admitting: Nurse Practitioner

## 2017-09-03 ENCOUNTER — Encounter: Payer: Self-pay | Admitting: Nurse Practitioner

## 2017-09-03 DIAGNOSIS — G4733 Obstructive sleep apnea (adult) (pediatric): Secondary | ICD-10-CM

## 2017-09-03 DIAGNOSIS — Z9989 Dependence on other enabling machines and devices: Secondary | ICD-10-CM

## 2017-09-03 NOTE — Patient Instructions (Signed)
Will set up for appt with Robin in sleep lab  Follow up in 3 months for repeat compliance

## 2017-09-04 ENCOUNTER — Encounter (HOSPITAL_COMMUNITY): Payer: BLUE CROSS/BLUE SHIELD

## 2017-09-07 ENCOUNTER — Encounter (HOSPITAL_COMMUNITY): Payer: BLUE CROSS/BLUE SHIELD

## 2017-09-09 ENCOUNTER — Telehealth: Payer: Self-pay

## 2017-09-09 ENCOUNTER — Encounter (HOSPITAL_COMMUNITY): Payer: BLUE CROSS/BLUE SHIELD

## 2017-09-09 NOTE — Telephone Encounter (Signed)
LM for patient to call back and schedule a mask fitting 

## 2017-09-11 ENCOUNTER — Encounter (HOSPITAL_COMMUNITY): Payer: BLUE CROSS/BLUE SHIELD

## 2017-09-14 ENCOUNTER — Encounter (HOSPITAL_COMMUNITY)
Admission: RE | Admit: 2017-09-14 | Discharge: 2017-09-14 | Disposition: A | Discharge status: Home / Self Care | Payer: BLUE CROSS/BLUE SHIELD | Source: Ambulatory Visit | Attending: Cardiovascular Disease | Admitting: Cardiovascular Disease

## 2017-09-14 DIAGNOSIS — Z951 Presence of aortocoronary bypass graft: Secondary | ICD-10-CM

## 2017-09-14 DIAGNOSIS — Z48812 Encounter for surgical aftercare following surgery on the circulatory system: Secondary | ICD-10-CM | POA: Diagnosis not present

## 2017-09-14 LAB — GLUCOSE, CAPILLARY: Glucose-Capillary: 153 mg/dL — ABNORMAL HIGH (ref 65–99)

## 2017-09-16 ENCOUNTER — Encounter (HOSPITAL_COMMUNITY)
Admission: RE | Admit: 2017-09-16 | Discharge: 2017-09-16 | Disposition: A | Discharge status: Home / Self Care | Payer: BLUE CROSS/BLUE SHIELD | Source: Ambulatory Visit | Attending: Cardiovascular Disease | Admitting: Cardiovascular Disease

## 2017-09-16 DIAGNOSIS — Z48812 Encounter for surgical aftercare following surgery on the circulatory system: Secondary | ICD-10-CM | POA: Diagnosis not present

## 2017-09-16 DIAGNOSIS — Z951 Presence of aortocoronary bypass graft: Secondary | ICD-10-CM

## 2017-09-16 LAB — LIPID PANEL
CHOL/HDL RATIO: 2.9 ratio (ref 0.0–5.0)
Cholesterol, Total: 112 mg/dL (ref 100–199)
HDL: 38 mg/dL — ABNORMAL LOW (ref >39)
LDL CALC: 61 mg/dL (ref 0–99)
Triglycerides: 67 mg/dL (ref 0–149)
VLDL Cholesterol Cal: 13 mg/dL (ref 5–40)

## 2017-09-16 LAB — COMPREHENSIVE METABOLIC PANEL
ALT: 15 IU/L (ref 0–44)
AST: 17 IU/L (ref 0–40)
Albumin/Globulin Ratio: 1.2 (ref 1.2–2.2)
Albumin: 4.1 g/dL (ref 3.5–5.5)
Alkaline Phosphatase: 104 IU/L (ref 39–117)
BILIRUBIN TOTAL: 0.4 mg/dL (ref 0.0–1.2)
BUN/Creatinine Ratio: 19 (ref 9–20)
BUN: 16 mg/dL (ref 6–24)
CALCIUM: 9 mg/dL (ref 8.7–10.2)
CHLORIDE: 102 mmol/L (ref 96–106)
CO2: 22 mmol/L (ref 20–29)
Creatinine, Ser: 0.86 mg/dL (ref 0.76–1.27)
GFR, EST AFRICAN AMERICAN: 112 mL/min/{1.73_m2} (ref >59)
GFR, EST NON AFRICAN AMERICAN: 97 mL/min/{1.73_m2} (ref >59)
GLUCOSE: 101 mg/dL — AB (ref 65–99)
Globulin, Total: 3.5 g/dL (ref 1.5–4.5)
Potassium: 4.2 mmol/L (ref 3.5–5.2)
Sodium: 140 mmol/L (ref 134–144)
TOTAL PROTEIN: 7.6 g/dL (ref 6.0–8.5)

## 2017-09-16 LAB — GLUCOSE, CAPILLARY: GLUCOSE-CAPILLARY: 151 mg/dL — AB (ref 65–99)

## 2017-09-17 NOTE — Progress Notes (Signed)
[name] Hammond 56 y.o. male DOB: July 25, 1961 MRN: [id number]      Nutrition Note  S/p CABG x 3  Labs:  Lab Results  Component Value Date   HGBA1C 6.6 (H) 05/09/2017   Wt Readings from Last 3 Encounters:  09/03/17 294 lb 12.8 oz (133.7 kg)  09/02/17 294 lb 12.8 oz (133.7 kg)  07/21/17 294 lb 12.1 oz (133.7 kg)    Note Spoke with pt. Nutrition plan and survey reviewed with pt. Pt is following Step 2 of the Therapeutic Lifestyle Changes diet. Pt wants to lose wt. Pt states he has maintained his wt since admission. Wt today 294 lb. Wt loss tips reviewed again. Pt is diabetic. Last A1c indicates blood glucose well-controlled. Pt checks CBG's 2 times daily. Fasting CBG's reportedly around "130 mg/dL." Pt aware of nutrition education classes offered. Pt has not reviewed all of the nutrition class material previously given. Pt expressed understanding of the information reviewed via feed-back method.  Nutrition Diagnosis ? Food-and nutrition-related knowledge deficit related to lack of exposure to information as related to diagnosis of: ? CVD ? DM ? Obesity related to excessive energy intake as evidenced by a BMI of 36.5  Nutrition Intervention ? Pt's individual nutrition plan and goals reviewed with pt. ? Benefits of adopting Therapeutic Lifestyle Changes discussed when Medficts reviewed.    Nutrition Goal(s):  Pt to identify food quantities necessary to achieve weight loss of 6-24 lb (2.7-10.9 kg) at graduation from cardiac rehab.   Plan:  Pt to attend nutrition classes ? Portion Distortion - met 08/12/17 ? Diabetes Q & A Will provide client-centered nutrition education as part of interdisciplinary care.   Monitor and evaluate progress toward nutrition goal with team.  Derek Mound, M.Ed, RD, LDN, CDE 09/17/2017 11:11 AM

## 2017-09-18 ENCOUNTER — Encounter (HOSPITAL_COMMUNITY)
Admission: RE | Admit: 2017-09-18 | Discharge: 2017-09-18 | Disposition: A | Discharge status: Home / Self Care | Payer: BLUE CROSS/BLUE SHIELD | Source: Ambulatory Visit | Attending: Cardiovascular Disease | Admitting: Cardiovascular Disease

## 2017-09-18 DIAGNOSIS — Z951 Presence of aortocoronary bypass graft: Secondary | ICD-10-CM

## 2017-09-18 DIAGNOSIS — Z48812 Encounter for surgical aftercare following surgery on the circulatory system: Secondary | ICD-10-CM | POA: Diagnosis not present

## 2017-09-18 LAB — GLUCOSE, CAPILLARY: Glucose-Capillary: 172 mg/dL — ABNORMAL HIGH (ref 65–99)

## 2017-09-21 ENCOUNTER — Encounter (HOSPITAL_COMMUNITY)
Admission: RE | Admit: 2017-09-21 | Discharge: 2017-09-21 | Disposition: A | Discharge status: Home / Self Care | Payer: BLUE CROSS/BLUE SHIELD | Source: Ambulatory Visit | Attending: Cardiovascular Disease | Admitting: Cardiovascular Disease

## 2017-09-21 DIAGNOSIS — Z48812 Encounter for surgical aftercare following surgery on the circulatory system: Secondary | ICD-10-CM | POA: Diagnosis not present

## 2017-09-21 DIAGNOSIS — Z951 Presence of aortocoronary bypass graft: Secondary | ICD-10-CM | POA: Diagnosis not present

## 2017-09-21 LAB — GLUCOSE, CAPILLARY: Glucose-Capillary: 161 mg/dL — ABNORMAL HIGH (ref 65–99)

## 2017-09-23 ENCOUNTER — Encounter (HOSPITAL_COMMUNITY): Payer: BLUE CROSS/BLUE SHIELD

## 2017-09-25 ENCOUNTER — Encounter (HOSPITAL_COMMUNITY)
Admission: RE | Admit: 2017-09-25 | Discharge: 2017-09-25 | Disposition: A | Discharge status: Home / Self Care | Payer: BLUE CROSS/BLUE SHIELD | Source: Ambulatory Visit | Attending: Cardiovascular Disease | Admitting: Cardiovascular Disease

## 2017-09-25 DIAGNOSIS — Z48812 Encounter for surgical aftercare following surgery on the circulatory system: Secondary | ICD-10-CM | POA: Diagnosis not present

## 2017-09-25 DIAGNOSIS — Z951 Presence of aortocoronary bypass graft: Secondary | ICD-10-CM

## 2017-09-25 LAB — GLUCOSE, CAPILLARY: Glucose-Capillary: 127 mg/dL — ABNORMAL HIGH (ref 65–99)

## 2017-09-28 ENCOUNTER — Encounter (HOSPITAL_COMMUNITY)
Admission: RE | Admit: 2017-09-28 | Discharge: 2017-09-28 | Disposition: A | Discharge status: Home / Self Care | Payer: BLUE CROSS/BLUE SHIELD | Source: Ambulatory Visit | Attending: Cardiovascular Disease | Admitting: Cardiovascular Disease

## 2017-09-28 DIAGNOSIS — Z48812 Encounter for surgical aftercare following surgery on the circulatory system: Secondary | ICD-10-CM | POA: Diagnosis not present

## 2017-09-28 DIAGNOSIS — Z951 Presence of aortocoronary bypass graft: Secondary | ICD-10-CM

## 2017-09-28 LAB — GLUCOSE, CAPILLARY: Glucose-Capillary: 134 mg/dL — ABNORMAL HIGH (ref 65–99)

## 2017-09-30 ENCOUNTER — Encounter (HOSPITAL_COMMUNITY)
Admission: RE | Admit: 2017-09-30 | Discharge: 2017-09-30 | Disposition: A | Discharge status: Home / Self Care | Payer: BLUE CROSS/BLUE SHIELD | Source: Ambulatory Visit | Attending: Cardiovascular Disease | Admitting: Cardiovascular Disease

## 2017-09-30 ENCOUNTER — Encounter (HOSPITAL_COMMUNITY): Payer: Self-pay

## 2017-09-30 DIAGNOSIS — Z951 Presence of aortocoronary bypass graft: Secondary | ICD-10-CM

## 2017-09-30 DIAGNOSIS — Z48812 Encounter for surgical aftercare following surgery on the circulatory system: Secondary | ICD-10-CM | POA: Diagnosis not present

## 2017-09-30 LAB — GLUCOSE, CAPILLARY: GLUCOSE-CAPILLARY: 151 mg/dL — AB (ref 65–99)

## 2017-09-30 NOTE — Progress Notes (Signed)
Cardiac Individual Treatment Plan  Patient Details  Name: [name] Hammond MRN: [id number] Date of Birth: [birth year] Referring Provider:     CARDIAC REHAB PHASE II ORIENTATION from 07/21/2017 in [facility] CARDIAC REHAB  Referring Provider  Chilton Si MD      Initial Encounter Date:    CARDIAC REHAB PHASE II ORIENTATION from 07/21/2017 in [facility] CARDIAC REHAB  Date  07/21/17  Referring Provider  Chilton Si MD      Visit Diagnosis: 05/13/17 S/P CABG (coronary artery bypass graft)  Patient's Home Medications on Admission:  Current Outpatient Prescriptions:  .  aspirin 325 MG EC tablet, Take 1 tablet (325 mg total) by mouth daily., Disp: , Rfl:  .  atorvastatin (LIPITOR) 80 MG tablet, Take 1 tablet (80 mg total) by mouth at bedtime., Disp: 90 tablet, Rfl: 2 .  levothyroxine (SYNTHROID, LEVOTHROID) 100 MCG tablet, Take 100 mcg by mouth daily before breakfast., Disp: , Rfl:  .  lisinopril-hydrochlorothiazide (PRINZIDE,ZESTORETIC) 20-12.5 MG tablet, Take 1 tablet by mouth daily at 12 noon. , Disp: , Rfl:  .  metoprolol tartrate (LOPRESSOR) 25 MG tablet, Take 0.5 tablets (12.5 mg total) by mouth 2 (two) times daily., Disp: 60 tablet, Rfl: 1 .  Saxagliptin-Metformin (KOMBIGLYZE XR) 04-999 MG TB24, Take 1 tablet by mouth daily with breakfast. , Disp: , Rfl:  .  sertraline (ZOLOFT) 50 MG tablet, Take 50 mg by mouth every evening. , Disp: , Rfl:   Past Medical History: Past Medical History:  Diagnosis Date  . Anxiety   . Diabetes mellitus without complication (HCC)   . Hypertension   . Hypothyroid   . Obstructive sleep apnea   . Pure hypercholesterolemia   . PVD (peripheral vascular disease) (HCC)   . Testicular hypofunction   . Vitamin D deficiency     Tobacco Use: History  Smoking Status  . Never Smoker  Smokeless Tobacco  . Never Used    Labs: Recent Review Flowsheet Data    Labs for ITP Cardiac and Pulmonary Rehab Latest  Ref Rng & Units 05/12/2017 05/12/2017 05/12/2017 05/13/2017 09/16/2017   Cholestrol 100 - 199 mg/dL - - - - 469   LDLCALC 0 - 99 mg/dL - - - - 61   HDL >62 mg/dL - - - - 95(M)   Trlycerides 0 - 149 mg/dL - - - - 67   Hemoglobin A1c 4.8 - 5.6 % - - - - -   PHART 7.350 - 7.450 7.310(L) - 7.319(L) 7.347(L) -   PCO2ART 32.0 - 48.0 mmHg 47.7 - 44.5 44.4 -   HCO3 20.0 - 28.0 mmol/L 24.0 - 22.8 23.7 -   TCO2 0 - 100 mmol/L 25 25 24 25  -   ACIDBASEDEF 0.0 - 2.0 mmol/L 2.0 - 3.0(H) 1.2 -   O2SAT % 99.0 - 95.0 93.6 -      Capillary Blood Glucose: Lab Results  Component Value Date   GLUCAP 151 (H) 09/30/2017   GLUCAP 134 (H) 09/28/2017   GLUCAP 127 (H) 09/25/2017   GLUCAP 161 (H) 09/21/2017   GLUCAP 172 (H) 09/18/2017     Exercise Target Goals:    Exercise Program Goal: Individual exercise prescription set with THRR, safety & activity barriers. Participant demonstrates ability to understand and report RPE using BORG scale, to self-measure pulse accurately, and to acknowledge the importance of the exercise prescription.  Exercise Prescription Goal: Starting with aerobic activity 30 plus minutes a day, 3 days per week for initial exercise  prescription. Provide home exercise prescription and guidelines that participant acknowledges understanding prior to discharge.  Activity Barriers & Risk Stratification:     Activity Barriers & Cardiac Risk Stratification - 07/21/17 1440      Activity Barriers & Cardiac Risk Stratification   Activity Barriers Muscular Weakness;Deconditioning   Cardiac Risk Stratification High      6 Minute Walk:     6 Minute Walk    Row Name 07/21/17 1557         6 Minute Walk   Phase Initial     Distance 1692 feet     Walk Time 6 minutes     # of Rest Breaks 0     MPH 3.2     METS 4.08     RPE 13     VO2 Peak 14.29     Resting HR 71 bpm     Resting BP 122/64     Max Ex. HR 128 bpm     Max Ex. BP 140/70     2 Minute Post BP 114/82        Oxygen  Initial Assessment:   Oxygen Re-Evaluation:   Oxygen Discharge (Final Oxygen Re-Evaluation):   Initial Exercise Prescription:     Initial Exercise Prescription - 07/21/17 1600      Date of Initial Exercise RX and Referring Provider   Date 07/21/17   Referring Provider Chilton Si MD     Treadmill   MPH 3   Grade 0   Minutes 10   METs 3.3     Bike   Level 1   Minutes 10   METs 2.4     NuStep   Level 3   SPM 80   Minutes 10   METs 2     Prescription Details   Frequency (times per week) 3   Duration Progress to 30 minutes of continuous aerobic without signs/symptoms of physical distress     Intensity   THRR 40-80% of Max Heartrate 66-132   Ratings of Perceived Exertion 11-13   Perceived Dyspnea 0-4     Progression   Progression Continue progressive overload as per policy without signs/symptoms or physical distress.     Resistance Training   Training Prescription Yes   Weight 3lbs   Reps 10-15      Perform Capillary Blood Glucose checks as needed.  Exercise Prescription Changes:     Exercise Prescription Changes    Row Name 07/27/17 1502 08/05/17 1500 08/17/17 1453 09/01/17 1400 09/14/17 1724     Response to Exercise   Blood Pressure (Admit) 104/74 110/70 108/70 110/80 130/80   Blood Pressure (Exercise) 168/80 158/80 130/80 142/82 150/80   Blood Pressure (Exit) 130/86 118/70 120/70 116/70 100/76   Heart Rate (Admit) 86 bpm 80 bpm 97 bpm 78 bpm 91 bpm   Heart Rate (Exercise) 129 bpm 108 bpm 138 bpm 120 bpm 136 bpm   Heart Rate (Exit) 78 bpm 69 bpm 95 bpm 84 bpm 81 bpm   Rating of Perceived Exertion (Exercise) 13 12 11 13 12    Symptoms none none none none none   Comments pt was oriented to exercise equipment  -  -  -  -   Duration Continue with 30 min of aerobic exercise without signs/symptoms of physical distress. Continue with 30 min of aerobic exercise without signs/symptoms of physical distress. Continue with 30 min of aerobic exercise  without signs/symptoms of physical distress. Continue with 30 min of aerobic exercise without signs/symptoms  of physical distress. Continue with 30 min of aerobic exercise without signs/symptoms of physical distress.   Intensity THRR unchanged THRR unchanged THRR unchanged THRR unchanged THRR unchanged     Progression   Progression Continue to progress workloads to maintain intensity without signs/symptoms of physical distress. Continue to progress workloads to maintain intensity without signs/symptoms of physical distress. Continue to progress workloads to maintain intensity without signs/symptoms of physical distress. Continue to progress workloads to maintain intensity without signs/symptoms of physical distress. Continue to progress workloads to maintain intensity without signs/symptoms of physical distress.   Average METs 2.6 3.1 3.7 3.1 3.7     Resistance Training   Training Prescription Yes Yes Yes Yes Yes   Weight 3lbs 4lbs 5lbs 5lbs 5lbs   Reps 10-15 10-15 10-15 10-15 10-15   Time 10 Minutes 10 Minutes 10 Minutes 10 Minutes 10 Minutes     Treadmill   MPH 3  - 3  - 3.2   Grade 0  - 0  - 2   Minutes 10  - 15  - 15   METs 3.3  - 3.3  - 4.33     Bike   Level  - 1  - 1.5 1.5   Minutes  - 10  - 15 15   METs  - 2.4  - 3.11 3.11     NuStep   Level 3 4 4 4   -   SPM 80 80 100 80  -   Minutes 10 10 15 15   -   METs 2.1 2.6 4.1 3.1  -     Home Exercise Plan   Plans to continue exercise at  -  - [employer] (comment)  [employer] (comment)  [employer] (comment)  [employer]   Frequency  -  - Add 2 additional days to program exercise sessions. Add 2 additional days to program exercise sessions. Add 2 additional days to program exercise sessions.   Initial Home Exercises Provided  -  - 08/14/17 08/14/17 08/14/17   Row Name 09/29/17 1700             Response to Exercise   Blood Pressure (Admit) 138/90       Blood Pressure  (Exercise) 156/84       Blood Pressure (Exit) 124/68       Heart Rate (Admit) 68 bpm       Heart Rate (Exercise) 126 bpm       Heart Rate (Exit) 78 bpm       Rating of Perceived Exertion (Exercise) 12       Symptoms none       Duration Continue with 30 min of aerobic exercise without signs/symptoms of physical distress.       Intensity THRR unchanged         Progression   Progression Continue to progress workloads to maintain intensity without signs/symptoms of physical distress.       Average METs 3.6         Resistance Training   Training Prescription Yes       Weight 5lbs       Reps 10-15       Time 10 Minutes         Bike   Level 1.5       Minutes 15       METs 3.12         NuStep   Level 4  SPM 100       Minutes 15       METs 4.2         Home Exercise Plan   Plans to continue exercise at [facility] (comment)  [employer]       Frequency Add 2 additional days to program exercise sessions.       Initial Home Exercises Provided 08/14/17          Exercise Comments:     Exercise Comments    Row Name 07/27/17 1517 08/14/17 1434 08/28/17 1628 09/29/17 1725     Exercise Comments Pt was oriented to exercise equipment today. Pt responded well to exercise session; will continue to monitor actvity levels. home exercise completed Reviewed METs and goals. Pt is progressing well in cardiac rehab; will continue to monitor pt's progress/activity levels  Reviewed METs and goals. Pt is progressing well in cardiac rehab; will continue to monitor pt's progress/activity levels        Exercise Goals and Review:     Exercise Goals    Row Name 07/21/17 1440             Exercise Goals   Increase Physical Activity Yes       Intervention Provide advice, education, support and counseling about physical activity/exercise needs.;Develop an individualized exercise prescription for aerobic and resistive training based on initial evaluation findings, risk  stratification, comorbidities and participant's personal goals.       Expected Outcomes Achievement of increased cardiorespiratory fitness and enhanced flexibility, muscular endurance and strength shown through measurements of functional capacity and personal statement of participant.       Increase Strength and Stamina Yes       Intervention Provide advice, education, support and counseling about physical activity/exercise needs.;Develop an individualized exercise prescription for aerobic and resistive training based on initial evaluation findings, risk stratification, comorbidities and participant's personal goals.       Expected Outcomes Achievement of increased cardiorespiratory fitness and enhanced flexibility, muscular endurance and strength shown through measurements of functional capacity and personal statement of participant.          Exercise Goals Re-Evaluation :     Exercise Goals Re-Evaluation    Row Name 08/28/17 1627 08/28/17 1631 09/29/17 1725         Exercise Goal Re-Evaluation   Exercise Goals Review Increase Physical Activity;Able to understand and use rate of perceived exertion (RPE) scale;Knowledge and understanding of Target Heart Rate Range (THRR);Understanding of Exercise Prescription;Able to check pulse independently;Increase Strength and Stamina  - Increase Physical Activity;Able to understand and use rate of perceived exertion (RPE) scale;Knowledge and understanding of Target Heart Rate Range (THRR);Understanding of Exercise Prescription;Able to check pulse independently;Increase Strength and Stamina     Comments Pt tolerates WL increases very well. Pt is also very compliant with HEP and goes to [employer] 2x/week in addition to coming to cardiac rehab. Pt tolerates WL increases very well. Pt is also very compliant with HEP and goes to [employer] 2x/week in addition to coming to cardiac rehab. Pt walks on TM for 15' and uses the ellipitcal for 15' at the gym Pt is  making great progress in cardiac rehab. Pt has increase TM speed to 3.2, with a 2%incline. Pt BP/HR does increase above target, with proper rest and cool-down, pt recovers well and get almost to baseline.      Expected Outcomes Pt will continue be compliant with HEP and improve in aerobic capacity.   -  Pt will continue be compliant with HEP and improve in aerobic capacity.          Discharge Exercise Prescription (Final Exercise Prescription Changes):     Exercise Prescription Changes - 09/29/17 1700      Response to Exercise   Blood Pressure (Admit) 138/90   Blood Pressure (Exercise) 156/84   Blood Pressure (Exit) 124/68   Heart Rate (Admit) 68 bpm   Heart Rate (Exercise) 126 bpm   Heart Rate (Exit) 78 bpm   Rating of Perceived Exertion (Exercise) 12   Symptoms none   Duration Continue with 30 min of aerobic exercise without signs/symptoms of physical distress.   Intensity THRR unchanged     Progression   Progression Continue to progress workloads to maintain intensity without signs/symptoms of physical distress.   Average METs 3.6     Resistance Training   Training Prescription Yes   Weight 5lbs   Reps 10-15   Time 10 Minutes     Bike   Level 1.5   Minutes 15   METs 3.12     NuStep   Level 4   SPM 100   Minutes 15   METs 4.2     Home Exercise Plan   Plans to continue exercise at [employer] (comment)  [employer]   Frequency Add 2 additional days to program exercise sessions.   Initial Home Exercises Provided 08/14/17      Nutrition:  Target Goals: Understanding of nutrition guidelines, daily intake of sodium 1500mg , cholesterol 200mg , calories 30% from fat and 7% or less from saturated fats, daily to have 5 or more servings of fruits and vegetables.  Biometrics:     Pre Biometrics - 07/21/17 1555      Pre Biometrics   Waist Circumference 44.75 inches   Hip Circumference 50 inches   Waist to Hip Ratio 0.9 %   Triceps Skinfold 31 mm   %  Body Fat 35.3 %   Grip Strength 38 kg   Flexibility 10 in   Single Leg Stand 25 seconds       Nutrition Therapy Plan and Nutrition Goals:     Nutrition Therapy & Goals - 07/21/17 1602      Nutrition Therapy   Diet Carb Modified, Therapeutic Lifestyle Changes     Personal Nutrition Goals   Nutrition Goal Wt loss of 1-2 lb/week to a wt loss goal of 6-24 lb at graduation from Cardiac Rehab.      Intervention Plan   Intervention Prescribe, educate and counsel regarding individualized specific dietary modifications aiming towards targeted core components such as weight, hypertension, lipid management, diabetes, heart failure and other comorbidities.   Expected Outcomes Short Term Goal: Understand basic principles of dietary content, such as calories, fat, sodium, cholesterol and nutrients.;Long Term Goal: Adherence to prescribed nutrition plan.      Nutrition Discharge: Nutrition Scores:     Nutrition Assessments - 07/21/17 1602      MEDFICTS Scores   Pre Score 39      Nutrition Goals Re-Evaluation:   Nutrition Goals Re-Evaluation:   Nutrition Goals Discharge (Final Nutrition Goals Re-Evaluation):   Psychosocial: Target Goals: Acknowledge presence or absence of significant depression and/or stress, maximize coping skills, provide positive support system. Participant is able to verbalize types and ability to use techniques and skills needed for reducing stress and depression.  Initial Review & Psychosocial Screening:     Initial Psych Review & Screening - 07/27/17 1627  Barriers   Psychosocial barriers to participate in program The patient should benefit from training in stress management and relaxation.     Screening Interventions   Interventions Encouraged to exercise;Program counselor consult;Provide feedback about the scores to participant      Quality of Life Scores:   PHQ-9: Recent Review Flowsheet Data    Depression screen [facility] 2/9 07/27/2017    Decreased Interest 1   Down, Depressed, Hopeless 1   PHQ - 2 Score 2   Altered sleeping 1   Tired, decreased energy 1   Change in appetite 1   Feeling bad or failure about yourself  1   Trouble concentrating 0   Moving slowly or fidgety/restless 0   Suicidal thoughts 0   PHQ-9 Score 6   Difficult doing work/chores Somewhat difficult     Interpretation of Total Score  Total Score Depression Severity:  1-4 = Minimal depression, 5-9 = Mild depression, 10-14 = Moderate depression, 15-19 = Moderately severe depression, 20-27 = Severe depression   Psychosocial Evaluation and Intervention:     Psychosocial Evaluation - 07/27/17 1627      Psychosocial Evaluation & Interventions   Interventions Stress management education;Relaxation education;Encouraged to exercise with the program and follow exercise prescription   Comments pt with known history of depression,currently treated wtih zoloft by PCP. at pt request, appt will be scheduled with Theda Belfast.    Expected Outcomes pt will demonstrate improved outlook with positive coping skills   Continue Psychosocial Services  Follow up required by staff      Psychosocial Re-Evaluation:     Psychosocial Re-Evaluation    Row Name 08/06/17 1136 09/02/17 1648 09/30/17 1620         Psychosocial Re-Evaluation   Current issues with None Identified;Current Stress Concerns;Current Anxiety/Panic;Current Depression None Identified;Current Stress Concerns;Current Anxiety/Panic;Current Depression None Identified;Current Stress Concerns;Current Anxiety/Panic;Current Depression     Comments pt with health related stress/anxiety and depression. appt will be made with Theda Belfast for counseling.   pt with health related stress/anxiety and depression. appt will be made with Theda Belfast for counseling.   pt with health related stress/anxiety and depression. appt will be made with Theda Belfast for counseling.       Expected Outcomes pt will exhibit  positive outlook with good coping skills.  pt will exhibit positive outlook with good coping skills.  pt will exhibit positive outlook with good coping skills.      Interventions Encouraged to attend Cardiac Rehabilitation for the exercise Encouraged to attend Cardiac Rehabilitation for the exercise Encouraged to attend Cardiac Rehabilitation for the exercise     Continue Psychosocial Services  Follow up required by staff Follow up required by staff Follow up required by staff     Comments per nursing assessment pt reports medium level of depression and anxiety.  Rates low level of stress "everyday" per nursing assessment pt reports medium level of depression and anxiety.  Rates low level of stress "everyday" per nursing assessment pt reports medium level of depression and anxiety.  Rates low level of stress "everyday"       Initial Review   Source of Stress Concerns Chronic Illness;Poor Coping Skills;Unable to participate in former interests or hobbies Chronic Illness;Poor Coping Skills;Unable to participate in former interests or hobbies Chronic Illness;Poor Coping Skills;Unable to participate in former interests or hobbies        Psychosocial Discharge (Final Psychosocial Re-Evaluation):     Psychosocial Re-Evaluation - 09/30/17 1620  Psychosocial Re-Evaluation   Current issues with None Identified;Current Stress Concerns;Current Anxiety/Panic;Current Depression   Comments pt with health related stress/anxiety and depression. appt will be made with Theda Belfast for counseling.     Expected Outcomes pt will exhibit positive outlook with good coping skills.    Interventions Encouraged to attend Cardiac Rehabilitation for the exercise   Continue Psychosocial Services  Follow up required by staff   Comments per nursing assessment pt reports medium level of depression and anxiety.  Rates low level of stress "everyday"     Initial Review   Source of Stress Concerns Chronic Illness;Poor  Coping Skills;Unable to participate in former interests or hobbies      Vocational Rehabilitation: Provide vocational rehab assistance to qualifying candidates.   Vocational Rehab Evaluation & Intervention:   Education: Education Goals: Education classes will be provided on a weekly basis, covering required topics. Participant will state understanding/return demonstration of topics presented.  Learning Barriers/Preferences:     Learning Barriers/Preferences - 07/21/17 1439      Learning Barriers/Preferences   Learning Barriers Sight   Learning Preferences Skilled Demonstration;Verbal Instruction      Education Topics: Count Your Pulse:  -Group instruction provided by verbal instruction, demonstration, patient participation and written materials to support subject.  Instructors address importance of being able to find your pulse and how to count your pulse when at home without a heart monitor.  Patients get hands on experience counting their pulse with staff help and individually.   CARDIAC REHAB PHASE II EXERCISE from 08/28/2017 in [facility] CARDIAC REHAB  Date  08/28/17  Instruction Review Code  2- meets goals/outcomes      Heart Attack, Angina, and Risk Factor Modification:  -Group instruction provided by verbal instruction, video, and written materials to support subject.  Instructors address signs and symptoms of angina and heart attacks.    Also discuss risk factors for heart disease and how to make changes to improve heart health risk factors.   Functional Fitness:  -Group instruction provided by verbal instruction, demonstration, patient participation, and written materials to support subject.  Instructors address safety measures for doing things around the house.  Discuss how to get up and down off the floor, how to pick things up properly, how to safely get out of a chair without assistance, and balance training.   CARDIAC REHAB PHASE II EXERCISE from  08/28/2017 in [facility] CARDIAC REHAB  Date  08/07/17  Instruction Review Code  2- meets goals/outcomes      Meditation and Mindfulness:  -Group instruction provided by verbal instruction, patient participation, and written materials to support subject.  Instructor addresses importance of mindfulness and meditation practice to help reduce stress and improve awareness.  Instructor also leads participants through a meditation exercise.    CARDIAC REHAB PHASE II EXERCISE from 08/28/2017 in [facility] CARDIAC REHAB  Date  08/05/17  Instruction Review Code  2- meets goals/outcomes      Stretching for Flexibility and Mobility:  -Group instruction provided by verbal instruction, patient participation, and written materials to support subject.  Instructors lead participants through series of stretches that are designed to increase flexibility thus improving mobility.  These stretches are additional exercise for major muscle groups that are typically performed during regular warm up and cool down.   Hands Only CPR:  -Group verbal, video, and participation provides a basic overview of AHA guidelines for community CPR. Role-play of emergencies allow participants the opportunity  to practice calling for help and chest compression technique with discussion of AED use.   Hypertension: -Group verbal and written instruction that provides a basic overview of hypertension including the most recent diagnostic guidelines, risk factor reduction with self-care instructions and medication management.    Nutrition I class: Heart Healthy Eating:  -Group instruction provided by PowerPoint slides, verbal discussion, and written materials to support subject matter. The instructor gives an explanation and review of the Therapeutic Lifestyle Changes diet recommendations, which includes a discussion on lipid goals, dietary fat, sodium, fiber, plant stanol/sterol esters, sugar, and the  components of a well-balanced, healthy diet.   CARDIAC REHAB PHASE II EXERCISE from 08/28/2017 in [facility] CARDIAC REHAB  Date  07/21/17  Educator  RD  Instruction Review Code  Not applicable      Nutrition II class: Lifestyle Skills:  -Group instruction provided by PowerPoint slides, verbal discussion, and written materials to support subject matter. The instructor gives an explanation and review of label reading, grocery shopping for heart health, heart healthy recipe modifications, and ways to make healthier choices when eating out.   CARDIAC REHAB PHASE II EXERCISE from 08/28/2017 in [facility] CARDIAC REHAB  Date  07/21/17  Educator  RD  Instruction Review Code  Not applicable      Diabetes Question & Answer:  -Group instruction provided by PowerPoint slides, verbal discussion, and written materials to support subject matter. The instructor gives an explanation and review of diabetes co-morbidities, pre- and post-prandial blood glucose goals, pre-exercise blood glucose goals, signs, symptoms, and treatment of hypoglycemia and hyperglycemia, and foot care basics.   Diabetes Blitz:  -Group instruction provided by PowerPoint slides, verbal discussion, and written materials to support subject matter. The instructor gives an explanation and review of the physiology behind type 1 and type 2 diabetes, diabetes medications and rational behind using different medications, pre- and post-prandial blood glucose recommendations and Hemoglobin A1c goals, diabetes diet, and exercise including blood glucose guidelines for exercising safely.    CARDIAC REHAB PHASE II EXERCISE from 08/28/2017 in [facility] CARDIAC REHAB  Date  07/21/17  Educator  RD  Instruction Review Code  Not applicable      Portion Distortion:  -Group instruction provided by PowerPoint slides, verbal discussion, written materials, and food models to support subject matter. The  instructor gives an explanation of serving size versus portion size, changes in portions sizes over the last 20 years, and what consists of a serving from each food group.   CARDIAC REHAB PHASE II EXERCISE from 08/28/2017 in [facility] CARDIAC REHAB  Date  08/12/17  Educator  RD  Instruction Review Code  2- meets goals/outcomes      Stress Management:  -Group instruction provided by verbal instruction, video, and written materials to support subject matter.  Instructors review role of stress in heart disease and how to cope with stress positively.     Exercising on Your Own:  -Group instruction provided by verbal instruction, power point, and written materials to support subject.  Instructors discuss benefits of exercise, components of exercise, frequency and intensity of exercise, and end points for exercise.  Also discuss use of nitroglycerin and activating EMS.  Review options of places to exercise outside of rehab.  Review guidelines for sex with heart disease.   CARDIAC REHAB PHASE II EXERCISE from 08/28/2017 in [facility] CARDIAC REHAB  Date  08/26/17  Educator  EP  Instruction Review  Code  2- meets goals/outcomes      Cardiac Drugs I:  -Group instruction provided by verbal instruction and written materials to support subject.  Instructor reviews cardiac drug classes: antiplatelets, anticoagulants, beta blockers, and statins.  Instructor discusses reasons, side effects, and lifestyle considerations for each drug class.   CARDIAC REHAB PHASE II EXERCISE from 08/28/2017 in [facility] CARDIAC REHAB  Date  07/29/17  Instruction Review Code  2- meets goals/outcomes      Cardiac Drugs II:  -Group instruction provided by verbal instruction and written materials to support subject.  Instructor reviews cardiac drug classes: angiotensin converting enzyme inhibitors (ACE-I), angiotensin II receptor blockers (ARBs), nitrates, and calcium channel  blockers.  Instructor discusses reasons, side effects, and lifestyle considerations for each drug class.   Anatomy and Physiology of the Circulatory System:  Group verbal and written instruction and models provide basic cardiac anatomy and physiology, with the coronary electrical and arterial systems. Review of: AMI, Angina, Valve disease, Heart Failure, Peripheral Artery Disease, Cardiac Arrhythmia, Pacemakers, and the ICD.   Other Education:  -Group or individual verbal, written, or video instructions that support the educational goals of the cardiac rehab program.   Knowledge Questionnaire Score:     Knowledge Questionnaire Score - 07/21/17 1617      Knowledge Questionnaire Score   Pre Score 21/24      Core Components/Risk Factors/Patient Goals at Admission:     Personal Goals and Risk Factors at Admission - 07/21/17 1443      Core Components/Risk Factors/Patient Goals on Admission    Weight Management Yes;Weight Loss;Weight Maintenance;Obesity   Intervention Weight Management: Develop a combined nutrition and exercise program designed to reach desired caloric intake, while maintaining appropriate intake of nutrient and fiber, sodium and fats, and appropriate energy expenditure required for the weight goal.;Weight Management: Provide education and appropriate resources to help participant work on and attain dietary goals.;Weight Management/Obesity: Establish reasonable short term and long term weight goals.;Obesity: Provide education and appropriate resources to help participant work on and attain dietary goals.   Expected Outcomes Short Term: Continue to assess and modify interventions until short term weight is achieved;Weight Maintenance: Understanding of the daily nutrition guidelines, which includes 25-35% calories from fat, 7% or less cal from saturated fats, less than  cholesterol, less than 1.5gm of sodium, & 5 or more servings of fruits and vegetables daily;Long Term:  Adherence to nutrition and physical activity/exercise program aimed toward attainment of established weight goal;Weight Loss: Understanding of general recommendations for a balanced deficit meal plan, which promotes 1-2 lb weight loss per week and includes a negative energy balance of [phone] kcal/d;Understanding recommendations for meals to include 15-35% energy as protein, 25-35% energy from fat, 35-60% energy from carbohydrates, less than  of dietary cholesterol, 20-35 gm of total fiber daily;Understanding of distribution of calorie intake throughout the day with the consumption of 4-5 meals/snacks;Weight Gain: Understanding of general recommendations for a high calorie, high protein meal plan that promotes weight gain by distributing calorie intake throughout the day with the consumption for 4-5 meals, snacks, and/or supplements   Diabetes Yes   Intervention Provide education about signs/symptoms and action to take for hypo/hyperglycemia.;Provide education about proper nutrition, including hydration, and aerobic/resistive exercise prescription along with prescribed medications to achieve blood glucose in normal ranges: Fasting glucose 65-99 mg/dL   Expected Outcomes Short Term: Participant verbalizes understanding of the signs/symptoms and immediate care of hyper/hypoglycemia, proper foot care and importance of medication, aerobic/resistive exercise and  nutrition plan for blood glucose control.;Long Term: Attainment of HbA1C < 7%.   Hypertension Yes   Intervention Provide education on lifestyle modifcations including regular physical activity/exercise, weight management, moderate sodium restriction and increased consumption of fresh fruit, vegetables, and low fat dairy, alcohol moderation, and smoking cessation.;Monitor prescription use compliance.   Expected Outcomes Short Term: Continued assessment and intervention until BP is < 140/18mm HG in hypertensive participants. < 130/42mm HG in  hypertensive participants with diabetes, heart failure or chronic kidney disease.;Long Term: Maintenance of blood pressure at goal levels.   Lipids Yes   Intervention Provide education and support for participant on nutrition & aerobic/resistive exercise along with prescribed medications to achieve LDL 70mg , HDL >40mg .   Expected Outcomes Short Term: Participant states understanding of desired cholesterol values and is compliant with medications prescribed. Participant is following exercise prescription and nutrition guidelines.;Long Term: Cholesterol controlled with medications as prescribed, with individualized exercise RX and with personalized nutrition plan. Value goals: LDL < , HDL > 40 mg.   Personal Goal Other Yes   Personal Goal Decrease fear avoidance, learn exercise limitations and lose weight. Overall goal is to be heart healthy   Intervention Provide cardiac education classes and exercise programming to assist with building confidence with actvity, and learn activity restrictions to promote a heart healthy lifestyle and    Expected Outcomes Pt will be able to lose wt, decrease fear avoidance and learn activity limitations.      Core Components/Risk Factors/Patient Goals Review:      Goals and Risk Factor Review    Row Name 08/06/17 1006 09/02/17 1648 09/30/17 1620         Core Components/Risk Factors/Patient Goals Review   Personal Goals Review Weight Management/Obesity;Hypertension;Lipids;Diabetes;Other Weight Management/Obesity;Hypertension;Lipids;Diabetes;Other Weight Management/Obesity;Hypertension;Lipids;Diabetes;Other     Review pt with multiple CAD RF demonstrates eagerness to participate in CR activiites to decrease cardiac risk, fear and learn ways to improve health.   pt with multiple CAD RF demonstrates eagerness to participate in CR activiites to decrease cardiac risk, fear and learn ways to improve health.   pt with multiple CAD RF demonstrates eagerness to  participate in CR activiites to decrease cardiac risk, fear and learn ways to improve health.  pt BP and blood sugar well controlled at cardiac rehab.     Expected Outcomes pt will participate in CR exercise, nutrition and lifestyle edcucation opportunities to decrease overall CAD RF.  pt will participate in CR exercise, nutrition and lifestyle edcucation opportunities to decrease overall CAD RF.  pt will participate in CR exercise, nutrition and lifestyle edcucation opportunities to decrease overall CAD RF.         Core Components/Risk Factors/Patient Goals at Discharge (Final Review):      Goals and Risk Factor Review - 09/30/17 1620      Core Components/Risk Factors/Patient Goals Review   Personal Goals Review Weight Management/Obesity;Hypertension;Lipids;Diabetes;Other   Review pt with multiple CAD RF demonstrates eagerness to participate in CR activiites to decrease cardiac risk, fear and learn ways to improve health.  pt BP and blood sugar well controlled at cardiac rehab.   Expected Outcomes pt will participate in CR exercise, nutrition and lifestyle edcucation opportunities to decrease overall CAD RF.       ITP Comments:     ITP Comments    Row Name 07/21/17 1421 08/06/17 1005 09/02/17 1647 09/30/17 1618     ITP Comments Dr. Armanda Magic, Medical Director Dr. Armanda Magic, Medical Director Dr. Armanda Magic, Medical Director 30  ITP review. pt with good attendance and participation.         Comments:

## 2017-10-02 ENCOUNTER — Encounter (HOSPITAL_COMMUNITY)
Admission: RE | Admit: 2017-10-02 | Discharge: 2017-10-02 | Disposition: A | Discharge status: Home / Self Care | Payer: BLUE CROSS/BLUE SHIELD | Source: Ambulatory Visit | Attending: Cardiovascular Disease | Admitting: Cardiovascular Disease

## 2017-10-02 DIAGNOSIS — Z48812 Encounter for surgical aftercare following surgery on the circulatory system: Secondary | ICD-10-CM | POA: Diagnosis not present

## 2017-10-02 DIAGNOSIS — Z951 Presence of aortocoronary bypass graft: Secondary | ICD-10-CM

## 2017-10-05 ENCOUNTER — Encounter (HOSPITAL_COMMUNITY)
Admission: RE | Admit: 2017-10-05 | Discharge: 2017-10-05 | Disposition: A | Discharge status: Home / Self Care | Payer: BLUE CROSS/BLUE SHIELD | Source: Ambulatory Visit | Attending: Cardiovascular Disease | Admitting: Cardiovascular Disease

## 2017-10-05 DIAGNOSIS — Z951 Presence of aortocoronary bypass graft: Secondary | ICD-10-CM

## 2017-10-05 DIAGNOSIS — Z48812 Encounter for surgical aftercare following surgery on the circulatory system: Secondary | ICD-10-CM | POA: Diagnosis not present

## 2017-10-07 ENCOUNTER — Encounter (HOSPITAL_COMMUNITY)
Admission: RE | Admit: 2017-10-07 | Discharge: 2017-10-07 | Disposition: A | Discharge status: Home / Self Care | Payer: BLUE CROSS/BLUE SHIELD | Source: Ambulatory Visit | Attending: Cardiovascular Disease | Admitting: Cardiovascular Disease

## 2017-10-07 DIAGNOSIS — Z48812 Encounter for surgical aftercare following surgery on the circulatory system: Secondary | ICD-10-CM | POA: Diagnosis not present

## 2017-10-07 DIAGNOSIS — Z951 Presence of aortocoronary bypass graft: Secondary | ICD-10-CM

## 2017-10-09 ENCOUNTER — Encounter (HOSPITAL_COMMUNITY)
Admission: RE | Admit: 2017-10-09 | Discharge: 2017-10-09 | Disposition: A | Discharge status: Home / Self Care | Payer: BLUE CROSS/BLUE SHIELD | Source: Ambulatory Visit | Attending: Cardiovascular Disease | Admitting: Cardiovascular Disease

## 2017-10-09 DIAGNOSIS — Z48812 Encounter for surgical aftercare following surgery on the circulatory system: Secondary | ICD-10-CM | POA: Diagnosis not present

## 2017-10-09 DIAGNOSIS — Z951 Presence of aortocoronary bypass graft: Secondary | ICD-10-CM

## 2017-10-12 ENCOUNTER — Encounter (HOSPITAL_COMMUNITY)
Admission: RE | Admit: 2017-10-12 | Discharge: 2017-10-12 | Disposition: A | Discharge status: Home / Self Care | Payer: BLUE CROSS/BLUE SHIELD | Source: Ambulatory Visit | Attending: Cardiovascular Disease | Admitting: Cardiovascular Disease

## 2017-10-12 DIAGNOSIS — Z48812 Encounter for surgical aftercare following surgery on the circulatory system: Secondary | ICD-10-CM | POA: Diagnosis not present

## 2017-10-12 DIAGNOSIS — Z951 Presence of aortocoronary bypass graft: Secondary | ICD-10-CM

## 2017-10-14 ENCOUNTER — Encounter (HOSPITAL_COMMUNITY)
Admission: RE | Admit: 2017-10-14 | Discharge: 2017-10-14 | Disposition: A | Discharge status: Home / Self Care | Payer: BLUE CROSS/BLUE SHIELD | Source: Ambulatory Visit | Attending: Cardiovascular Disease | Admitting: Cardiovascular Disease

## 2017-10-14 DIAGNOSIS — Z48812 Encounter for surgical aftercare following surgery on the circulatory system: Secondary | ICD-10-CM | POA: Diagnosis not present

## 2017-10-14 DIAGNOSIS — Z951 Presence of aortocoronary bypass graft: Secondary | ICD-10-CM

## 2017-10-16 ENCOUNTER — Encounter (HOSPITAL_COMMUNITY)
Admission: RE | Admit: 2017-10-16 | Discharge: 2017-10-16 | Disposition: A | Discharge status: Home / Self Care | Payer: BLUE CROSS/BLUE SHIELD | Source: Ambulatory Visit | Attending: Cardiovascular Disease | Admitting: Cardiovascular Disease

## 2017-10-16 DIAGNOSIS — Z48812 Encounter for surgical aftercare following surgery on the circulatory system: Secondary | ICD-10-CM | POA: Diagnosis not present

## 2017-10-16 DIAGNOSIS — Z951 Presence of aortocoronary bypass graft: Secondary | ICD-10-CM

## 2017-10-19 ENCOUNTER — Encounter (HOSPITAL_COMMUNITY)
Admission: RE | Admit: 2017-10-19 | Discharge: 2017-10-19 | Disposition: A | Discharge status: Home / Self Care | Payer: BLUE CROSS/BLUE SHIELD | Source: Ambulatory Visit | Attending: Cardiovascular Disease | Admitting: Cardiovascular Disease

## 2017-10-19 DIAGNOSIS — Z951 Presence of aortocoronary bypass graft: Secondary | ICD-10-CM

## 2017-10-19 DIAGNOSIS — Z48812 Encounter for surgical aftercare following surgery on the circulatory system: Secondary | ICD-10-CM | POA: Diagnosis not present

## 2017-10-21 ENCOUNTER — Other Ambulatory Visit: Payer: Self-pay | Admitting: Physician Assistant

## 2017-10-21 ENCOUNTER — Encounter (HOSPITAL_COMMUNITY)
Admission: RE | Admit: 2017-10-21 | Discharge: 2017-10-21 | Disposition: A | Discharge status: Home / Self Care | Payer: BLUE CROSS/BLUE SHIELD | Source: Ambulatory Visit | Attending: Cardiovascular Disease | Admitting: Cardiovascular Disease

## 2017-10-21 DIAGNOSIS — Z951 Presence of aortocoronary bypass graft: Secondary | ICD-10-CM

## 2017-10-21 DIAGNOSIS — Z48812 Encounter for surgical aftercare following surgery on the circulatory system: Secondary | ICD-10-CM | POA: Diagnosis not present

## 2017-10-23 ENCOUNTER — Encounter (HOSPITAL_COMMUNITY)
Admission: RE | Admit: 2017-10-23 | Discharge: 2017-10-23 | Disposition: A | Discharge status: Home / Self Care | Payer: BLUE CROSS/BLUE SHIELD | Source: Ambulatory Visit | Attending: Cardiovascular Disease | Admitting: Cardiovascular Disease

## 2017-10-23 DIAGNOSIS — Z48812 Encounter for surgical aftercare following surgery on the circulatory system: Secondary | ICD-10-CM | POA: Diagnosis not present

## 2017-10-23 DIAGNOSIS — Z951 Presence of aortocoronary bypass graft: Secondary | ICD-10-CM | POA: Diagnosis not present

## 2017-10-23 NOTE — Progress Notes (Signed)
Cardiac Individual Treatment Plan  Patient Details  Name: [name] Hammond MRN: [id number] Date of Birth: [birth year] Referring Provider:     CARDIAC REHAB PHASE II ORIENTATION from 07/21/2017 in [facility] CARDIAC REHAB  Referring Provider  Chilton Si MD      Initial Encounter Date:    CARDIAC REHAB PHASE II ORIENTATION from 07/21/2017 in [facility] CARDIAC REHAB  Date  07/21/17  Referring Provider  Chilton Si MD      Visit Diagnosis: 05/13/17 S/P CABG (coronary artery bypass graft)  Patient's Home Medications on Admission:  Current Outpatient Prescriptions:  .  aspirin 325 MG EC tablet, Take 1 tablet (325 mg total) by mouth daily., Disp: , Rfl:  .  atorvastatin (LIPITOR) 80 MG tablet, Take 1 tablet (80 mg total) by mouth at bedtime., Disp: 90 tablet, Rfl: 2 .  levothyroxine (SYNTHROID, LEVOTHROID) 100 MCG tablet, Take 100 mcg by mouth daily before breakfast., Disp: , Rfl:  .  lisinopril-hydrochlorothiazide (PRINZIDE,ZESTORETIC) 20-12.5 MG tablet, Take 1 tablet by mouth daily at 12 noon. , Disp: , Rfl:  .  metoprolol tartrate (LOPRESSOR) 25 MG tablet, Take 0.5 tablets (12.5 mg total) by mouth 2 (two) times daily., Disp: 60 tablet, Rfl: 1 .  Saxagliptin-Metformin (KOMBIGLYZE XR) 04-999 MG TB24, Take 1 tablet by mouth daily with breakfast. , Disp: , Rfl:  .  sertraline (ZOLOFT) 50 MG tablet, Take 50 mg by mouth every evening. , Disp: , Rfl:   Past Medical History: Past Medical History:  Diagnosis Date  . Anxiety   . Diabetes mellitus without complication (HCC)   . Hypertension   . Hypothyroid   . Obstructive sleep apnea   . Pure hypercholesterolemia   . PVD (peripheral vascular disease) (HCC)   . Testicular hypofunction   . Vitamin D deficiency     Tobacco Use: History  Smoking Status  . Never Smoker  Smokeless Tobacco  . Never Used    Labs: Recent Review Flowsheet Data    Labs for ITP Cardiac and Pulmonary Rehab Latest  Ref Rng & Units 05/12/2017 05/12/2017 05/12/2017 05/13/2017 09/16/2017   Cholestrol 100 - 199 mg/dL - - - - 469   LDLCALC 0 - 99 mg/dL - - - - 61   HDL >62 mg/dL - - - - 95(M)   Trlycerides 0 - 149 mg/dL - - - - 67   Hemoglobin A1c 4.8 - 5.6 % - - - - -   PHART 7.350 - 7.450 7.310(L) - 7.319(L) 7.347(L) -   PCO2ART 32.0 - 48.0 mmHg 47.7 - 44.5 44.4 -   HCO3 20.0 - 28.0 mmol/L 24.0 - 22.8 23.7 -   TCO2 0 - 100 mmol/L 25 25 24 25  -   ACIDBASEDEF 0.0 - 2.0 mmol/L 2.0 - 3.0(H) 1.2 -   O2SAT % 99.0 - 95.0 93.6 -      Capillary Blood Glucose: Lab Results  Component Value Date   GLUCAP 151 (H) 09/30/2017   GLUCAP 134 (H) 09/28/2017   GLUCAP 127 (H) 09/25/2017   GLUCAP 161 (H) 09/21/2017   GLUCAP 172 (H) 09/18/2017     Exercise Target Goals:    Exercise Program Goal: Individual exercise prescription set with THRR, safety & activity barriers. Participant demonstrates ability to understand and report RPE using BORG scale, to self-measure pulse accurately, and to acknowledge the importance of the exercise prescription.  Exercise Prescription Goal: Starting with aerobic activity 30 plus minutes a day, 3 days per week for initial exercise  prescription. Provide home exercise prescription and guidelines that participant acknowledges understanding prior to discharge.  Activity Barriers & Risk Stratification:     Activity Barriers & Cardiac Risk Stratification - 07/21/17 1440      Activity Barriers & Cardiac Risk Stratification   Activity Barriers Muscular Weakness;Deconditioning   Cardiac Risk Stratification High      6 Minute Walk:     6 Minute Walk    Row Name 07/21/17 1557         6 Minute Walk   Phase Initial     Distance 1692 feet     Walk Time 6 minutes     # of Rest Breaks 0     MPH 3.2     METS 4.08     RPE 13     VO2 Peak 14.29     Resting HR 71 bpm     Resting BP 122/64     Max Ex. HR 128 bpm     Max Ex. BP 140/70     2 Minute Post BP 114/82        Oxygen  Initial Assessment:   Oxygen Re-Evaluation:   Oxygen Discharge (Final Oxygen Re-Evaluation):   Initial Exercise Prescription:     Initial Exercise Prescription - 07/21/17 1600      Date of Initial Exercise RX and Referring Provider   Date 07/21/17   Referring Provider Chilton Si MD     Treadmill   MPH 3   Grade 0   Minutes 10   METs 3.3     Bike   Level 1   Minutes 10   METs 2.4     NuStep   Level 3   SPM 80   Minutes 10   METs 2     Prescription Details   Frequency (times per week) 3   Duration Progress to 30 minutes of continuous aerobic without signs/symptoms of physical distress     Intensity   THRR 40-80% of Max Heartrate 66-132   Ratings of Perceived Exertion 11-13   Perceived Dyspnea 0-4     Progression   Progression Continue progressive overload as per policy without signs/symptoms or physical distress.     Resistance Training   Training Prescription Yes   Weight 3lbs   Reps 10-15      Perform Capillary Blood Glucose checks as needed.  Exercise Prescription Changes:     Exercise Prescription Changes    Row Name 07/27/17 1502 08/05/17 1500 08/17/17 1453 09/01/17 1400 09/14/17 1724     Response to Exercise   Blood Pressure (Admit) 104/74 110/70 108/70 110/80 130/80   Blood Pressure (Exercise) 168/80 158/80 130/80 142/82 150/80   Blood Pressure (Exit) 130/86 118/70 120/70 116/70 100/76   Heart Rate (Admit) 86 bpm 80 bpm 97 bpm 78 bpm 91 bpm   Heart Rate (Exercise) 129 bpm 108 bpm 138 bpm 120 bpm 136 bpm   Heart Rate (Exit) 78 bpm 69 bpm 95 bpm 84 bpm 81 bpm   Rating of Perceived Exertion (Exercise) 13 12 11 13 12    Symptoms none none none none none   Comments pt was oriented to exercise equipment  -  -  -  -   Duration Continue with 30 min of aerobic exercise without signs/symptoms of physical distress. Continue with 30 min of aerobic exercise without signs/symptoms of physical distress. Continue with 30 min of aerobic exercise  without signs/symptoms of physical distress. Continue with 30 min of aerobic exercise without signs/symptoms  of physical distress. Continue with 30 min of aerobic exercise without signs/symptoms of physical distress.   Intensity THRR unchanged THRR unchanged THRR unchanged THRR unchanged THRR unchanged     Progression   Progression Continue to progress workloads to maintain intensity without signs/symptoms of physical distress. Continue to progress workloads to maintain intensity without signs/symptoms of physical distress. Continue to progress workloads to maintain intensity without signs/symptoms of physical distress. Continue to progress workloads to maintain intensity without signs/symptoms of physical distress. Continue to progress workloads to maintain intensity without signs/symptoms of physical distress.   Average METs 2.6 3.1 3.7 3.1 3.7     Resistance Training   Training Prescription Yes Yes Yes Yes Yes   Weight 3lbs 4lbs 5lbs 5lbs 5lbs   Reps 10-15 10-15 10-15 10-15 10-15   Time 10 Minutes 10 Minutes 10 Minutes 10 Minutes 10 Minutes     Treadmill   MPH 3  - 3  - 3.2   Grade 0  - 0  - 2   Minutes 10  - 15  - 15   METs 3.3  - 3.3  - 4.33     Bike   Level  - 1  - 1.5 1.5   Minutes  - 10  - 15 15   METs  - 2.4  - 3.11 3.11     NuStep   Level 3 4 4 4   -   SPM 80 80 100 80  -   Minutes 10 10 15 15   -   METs 2.1 2.6 4.1 3.1  -     Home Exercise Plan   [facility]   Frequency  -  - Add 2 additional days to program exercise sessions. Add 2 additional days to program exercise sessions. Add 2 additional days to program exercise sessions.   Initial Home Exercises Provided  -  - 08/14/17 08/14/17 08/14/17   Row Name 09/29/17 1700 10/12/17 1700           Response to Exercise   Blood Pressure (Admit) 138/90 132/80       Blood Pressure (Exercise) 156/84 156/80      Blood Pressure (Exit) 124/68 122/60      Heart Rate (Admit) 68 bpm 78 bpm      Heart Rate (Exercise) 126 bpm 151 bpm      Heart Rate (Exit) 78 bpm 88 bpm      Rating of Perceived Exertion (Exercise) 12 11      Symptoms none none      Duration Continue with 30 min of aerobic exercise without signs/symptoms of physical distress. Continue with 30 min of aerobic exercise without signs/symptoms of physical distress.      Intensity THRR unchanged THRR unchanged        Progression   Progression Continue to progress workloads to maintain intensity without signs/symptoms of physical distress. Continue to progress workloads to maintain intensity without signs/symptoms of physical distress.      Average METs 3.6 4.1        Resistance Training   Training Prescription Yes Yes      Weight 5lbs 9lbs      Reps 10-15 10-15      Time 10 Minutes 10 Minutes        Treadmill   MPH  - 3.4      Grade  -  4      Minutes  - 15      METs  - 5.48        Bike   Level 1.5  -      Minutes 15  -      METs 3.12  -        NuStep   Level 4 4      SPM 100 90      Minutes 15 15      METs 4.2 2.8        Home Exercise Plan   Plans to continue exercise at [employer] (comment)  [facility]  [facility]      Frequency Add 2 additional days to program exercise sessions. Add 2 additional days to program exercise sessions.      Initial Home Exercises Provided 08/14/17 08/14/17         Exercise Comments:     Exercise Comments    Row Name 07/27/17 1517 08/14/17 1434 08/28/17 1628 09/29/17 1725 10/21/17 1122   Exercise Comments Pt was oriented to exercise equipment today. Pt responded well to exercise session; will continue to monitor actvity levels. home exercise completed Reviewed METs and goals. Pt is progressing well in cardiac rehab; will continue to monitor pt's progress/activity levels  Reviewed METs and goals. Pt is  progressing well in cardiac rehab; will continue to monitor pt's progress/activity levels  Reviewed METs and goals. Pt is progressing well in cardiac rehab; will continue to monitor pt's progress/activity levels       Exercise Goals and Review:     Exercise Goals    Row Name 07/21/17 1440             Exercise Goals   Increase Physical Activity Yes       Intervention Provide advice, education, support and counseling about physical activity/exercise needs.;Develop an individualized exercise prescription for aerobic and resistive training based on initial evaluation findings, risk stratification, comorbidities and participant's personal goals.       Expected Outcomes Achievement of increased cardiorespiratory fitness and enhanced flexibility, muscular endurance and strength shown through measurements of functional capacity and personal statement of participant.       Increase Strength and Stamina Yes       Intervention Provide advice, education, support and counseling about physical activity/exercise needs.;Develop an individualized exercise prescription for aerobic and resistive training based on initial evaluation findings, risk stratification, comorbidities and participant's personal goals.       Expected Outcomes Achievement of increased cardiorespiratory fitness and enhanced flexibility, muscular endurance and strength shown through measurements of functional capacity and personal statement of participant.          Exercise Goals Re-Evaluation :     Exercise Goals Re-Evaluation    Row Name 08/28/17 1627 08/28/17 1631 09/29/17 1725 10/21/17 1122       Exercise Goal Re-Evaluation   Exercise Goals Review Increase Physical Activity;Able to understand and use rate of perceived exertion (RPE) scale;Knowledge and understanding of Target Heart Rate Range (THRR);Understanding of Exercise Prescription;Able to check pulse independently;Increase Strength and Stamina  - Increase Physical  Activity;Able to understand and use rate of perceived exertion (RPE) scale;Knowledge and understanding of Target Heart Rate Range (THRR);Understanding of Exercise Prescription;Able to check pulse independently;Increase Strength and Stamina Increase Physical Activity;Able to understand and use rate of perceived exertion (RPE) scale;Knowledge and understanding of Target Heart Rate Range (THRR);Understanding of Exercise Prescription;Able to check pulse independently;Increase Strength and Stamina  Comments Pt tolerates WL increases very well. Pt is also very compliant with HEP and goes to [employer] 2x/week in addition to coming to cardiac rehab. Pt tolerates WL increases very well. Pt is also very compliant with HEP and goes to [employer] 2x/week in addition to coming to cardiac rehab. Pt walks on TM for 15' and uses the ellipitcal for 15' at the gym Pt is making great progress in cardiac rehab. Pt has increase TM speed to 3.2, with a 2%incline. Pt BP/HR does increase above target, with proper rest and cool-down, pt recovers well and get almost to baseline.  Pt states he getting stronger. Pt will be introduced to weights on 10/23/17. Pt is consistent with exercise program at [employer] in addition to coming to cardiac rehab.     Expected Outcomes Pt will continue be compliant with HEP and improve in aerobic capacity.   - Pt will continue be compliant with HEP and improve in aerobic capacity.  Pt will continue be compliant with HEP and improve in aerobic capacity and musculoskeletal strength.         Discharge Exercise Prescription (Final Exercise Prescription Changes):     Exercise Prescription Changes - 10/12/17 1700      Response to Exercise   Blood Pressure (Admit) 132/80   Blood Pressure (Exercise) 156/80   Blood Pressure (Exit) 122/60   Heart Rate (Admit) 78 bpm   Heart Rate (Exercise) 151 bpm   Heart Rate (Exit) 88 bpm   Rating of Perceived Exertion (Exercise) 11   Symptoms none    Duration Continue with 30 min of aerobic exercise without signs/symptoms of physical distress.   Intensity THRR unchanged     Progression   Progression Continue to progress workloads to maintain intensity without signs/symptoms of physical distress.   Average METs 4.1     Resistance Training   Training Prescription Yes   Weight 9lbs   Reps 10-15   Time 10 Minutes     Treadmill   MPH 3.4   Grade 4   Minutes 15   METs 5.48     NuStep   Level 4   SPM 90   Minutes 15   METs 2.8     Home Exercise Plan   Plans to continue exercise at [employer] (comment)  [facility]   Frequency Add 2 additional days to program exercise sessions.   Initial Home Exercises Provided 08/14/17      Nutrition:  Target Goals: Understanding of nutrition guidelines, daily intake of sodium 1500mg , cholesterol 200mg , calories 30% from fat and 7% or less from saturated fats, daily to have 5 or more servings of fruits and vegetables.  Biometrics:     Pre Biometrics - 07/21/17 1555      Pre Biometrics   Waist Circumference 44.75 inches   Hip Circumference 50 inches   Waist to Hip Ratio 0.9 %   Triceps Skinfold 31 mm   % Body Fat 35.3 %   Grip Strength 38 kg   Flexibility 10 in   Single Leg Stand 25 seconds       Nutrition Therapy Plan and Nutrition Goals:     Nutrition Therapy & Goals - 07/21/17 1602      Nutrition Therapy   Diet Carb Modified, Therapeutic Lifestyle Changes     Personal Nutrition Goals   Nutrition Goal Wt loss of 1-2 lb/week to a wt loss goal of 6-24 lb at graduation from Cardiac Rehab.  Intervention Plan   Intervention Prescribe, educate and counsel regarding individualized specific dietary modifications aiming towards targeted core components such as weight, hypertension, lipid management, diabetes, heart failure and other comorbidities.   Expected Outcomes Short Term Goal: Understand basic principles of dietary content, such as calories, fat,  sodium, cholesterol and nutrients.;Long Term Goal: Adherence to prescribed nutrition plan.      Nutrition Discharge: Nutrition Scores:     Nutrition Assessments - 07/21/17 1602      MEDFICTS Scores   Pre Score 39      Nutrition Goals Re-Evaluation:   Nutrition Goals Re-Evaluation:   Nutrition Goals Discharge (Final Nutrition Goals Re-Evaluation):   Psychosocial: Target Goals: Acknowledge presence or absence of significant depression and/or stress, maximize coping skills, provide positive support system. Participant is able to verbalize types and ability to use techniques and skills needed for reducing stress and depression.  Initial Review & Psychosocial Screening:     Initial Psych Review & Screening - 07/27/17 1627      Barriers   Psychosocial barriers to participate in program The patient should benefit from training in stress management and relaxation.     Screening Interventions   Interventions Encouraged to exercise;Program counselor consult;Provide feedback about the scores to participant      Quality of Life Scores:   PHQ-9: Recent Review Flowsheet Data    Depression screen [facility] 2/9 07/27/2017   Decreased Interest 1   Down, Depressed, Hopeless 1   PHQ - 2 Score 2   Altered sleeping 1   Tired, decreased energy 1   Change in appetite 1   Feeling bad or failure about yourself  1   Trouble concentrating 0   Moving slowly or fidgety/restless 0   Suicidal thoughts 0   PHQ-9 Score 6   Difficult doing work/chores Somewhat difficult     Interpretation of Total Score  Total Score Depression Severity:  1-4 = Minimal depression, 5-9 = Mild depression, 10-14 = Moderate depression, 15-19 = Moderately severe depression, 20-27 = Severe depression   Psychosocial Evaluation and Intervention:     Psychosocial Evaluation - 07/27/17 1627      Psychosocial Evaluation & Interventions   Interventions Stress management education;Relaxation education;Encouraged to  exercise with the program and follow exercise prescription   Comments pt with known history of depression,currently treated wtih zoloft by PCP. at pt request, appt will be scheduled with Theda Belfast.    Expected Outcomes pt will demonstrate improved outlook with positive coping skills   Continue Psychosocial Services  Follow up required by staff      Psychosocial Re-Evaluation:     Psychosocial Re-Evaluation    Row Name 08/06/17 1136 09/02/17 1648 09/30/17 1620 10/23/17 1205       Psychosocial Re-Evaluation   Current issues with None Identified;Current Stress Concerns;Current Anxiety/Panic;Current Depression None Identified;Current Stress Concerns;Current Anxiety/Panic;Current Depression None Identified;Current Stress Concerns;Current Anxiety/Panic;Current Depression None Identified;Current Stress Concerns;Current Anxiety/Panic;Current Depression    Comments pt with health related stress/anxiety and depression. appt will be made with Theda Belfast for counseling.   pt with health related stress/anxiety and depression. appt will be made with Theda Belfast for counseling.   pt with health related stress/anxiety and depression. appt will be made with Theda Belfast for counseling.   pt with health related stress/anxiety and depression. appt will be made with Theda Belfast for counseling.      Expected Outcomes pt will exhibit positive outlook with good coping skills.  pt will exhibit positive outlook with good coping  skills.  pt will exhibit positive outlook with good coping skills.  pt will exhibit positive outlook with good coping skills.     Interventions Encouraged to attend Cardiac Rehabilitation for the exercise Encouraged to attend Cardiac Rehabilitation for the exercise Encouraged to attend Cardiac Rehabilitation for the exercise Encouraged to attend Cardiac Rehabilitation for the exercise    Continue Psychosocial Services  Follow up required by staff Follow up required by staff Follow up  required by staff Follow up required by staff    Comments per nursing assessment pt reports medium level of depression and anxiety.  Rates low level of stress "everyday" per nursing assessment pt reports medium level of depression and anxiety.  Rates low level of stress "everyday" per nursing assessment pt reports medium level of depression and anxiety.  Rates low level of stress "everyday" per nursing assessment pt reports medium level of depression and anxiety.  Rates low level of stress "everyday"      Initial Review   Source of Stress Concerns Chronic Illness;Poor Coping Skills;Unable to participate in former interests or hobbies Chronic Illness;Poor Coping Skills;Unable to participate in former interests or hobbies Chronic Illness;Poor Coping Skills;Unable to participate in former interests or hobbies Chronic Illness;Poor Coping Skills;Unable to participate in former interests or hobbies       Psychosocial Discharge (Final Psychosocial Re-Evaluation):     Psychosocial Re-Evaluation - 10/23/17 1205      Psychosocial Re-Evaluation   Current issues with None Identified;Current Stress Concerns;Current Anxiety/Panic;Current Depression   Comments pt with health related stress/anxiety and depression. appt will be made with Theda Belfast for counseling.     Expected Outcomes pt will exhibit positive outlook with good coping skills.    Interventions Encouraged to attend Cardiac Rehabilitation for the exercise   Continue Psychosocial Services  Follow up required by staff   Comments per nursing assessment pt reports medium level of depression and anxiety.  Rates low level of stress "everyday"     Initial Review   Source of Stress Concerns Chronic Illness;Poor Coping Skills;Unable to participate in former interests or hobbies      Vocational Rehabilitation: Provide vocational rehab assistance to qualifying candidates.   Vocational Rehab Evaluation & Intervention:   Education: Education  Goals: Education classes will be provided on a weekly basis, covering required topics. Participant will state understanding/return demonstration of topics presented.  Learning Barriers/Preferences:     Learning Barriers/Preferences - 07/21/17 1439      Learning Barriers/Preferences   Learning Barriers Sight   Learning Preferences Skilled Demonstration;Verbal Instruction      Education Topics: Count Your Pulse:  -Group instruction provided by verbal instruction, demonstration, patient participation and written materials to support subject.  Instructors address importance of being able to find your pulse and how to count your pulse when at home without a heart monitor.  Patients get hands on experience counting their pulse with staff help and individually.   CARDIAC REHAB PHASE II EXERCISE from 10/16/2017 in [facility] CARDIAC REHAB  Date  08/28/17  Instruction Review Code  2- meets goals/outcomes      Heart Attack, Angina, and Risk Factor Modification:  -Group instruction provided by verbal instruction, video, and written materials to support subject.  Instructors address signs and symptoms of angina and heart attacks.    Also discuss risk factors for heart disease and how to make changes to improve heart health risk factors.   Functional Fitness:  -Group instruction provided by verbal instruction, demonstration, patient participation,  and written materials to support subject.  Instructors address safety measures for doing things around the house.  Discuss how to get up and down off the floor, how to pick things up properly, how to safely get out of a chair without assistance, and balance training.   CARDIAC REHAB PHASE II EXERCISE from 10/16/2017 in [facility] CARDIAC REHAB  Date  08/07/17  Instruction Review Code  2- meets goals/outcomes      Meditation and Mindfulness:  -Group instruction provided by verbal instruction, patient participation,  and written materials to support subject.  Instructor addresses importance of mindfulness and meditation practice to help reduce stress and improve awareness.  Instructor also leads participants through a meditation exercise.    CARDIAC REHAB PHASE II EXERCISE from 10/16/2017 in [facility] CARDIAC REHAB  Date  08/05/17  Instruction Review Code  2- meets goals/outcomes      Stretching for Flexibility and Mobility:  -Group instruction provided by verbal instruction, patient participation, and written materials to support subject.  Instructors lead participants through series of stretches that are designed to increase flexibility thus improving mobility.  These stretches are additional exercise for major muscle groups that are typically performed during regular warm up and cool down.   Hands Only CPR:  -Group verbal, video, and participation provides a basic overview of AHA guidelines for community CPR. Role-play of emergencies allow participants the opportunity to practice calling for help and chest compression technique with discussion of AED use.   Hypertension: -Group verbal and written instruction that provides a basic overview of hypertension including the most recent diagnostic guidelines, risk factor reduction with self-care instructions and medication management.    Nutrition I class: Heart Healthy Eating:  -Group instruction provided by PowerPoint slides, verbal discussion, and written materials to support subject matter. The instructor gives an explanation and review of the Therapeutic Lifestyle Changes diet recommendations, which includes a discussion on lipid goals, dietary fat, sodium, fiber, plant stanol/sterol esters, sugar, and the components of a well-balanced, healthy diet.   CARDIAC REHAB PHASE II EXERCISE from 10/16/2017 in [facility] CARDIAC REHAB  Date  07/21/17  Educator  RD  Instruction Review Code  Not applicable      Nutrition  II class: Lifestyle Skills:  -Group instruction provided by PowerPoint slides, verbal discussion, and written materials to support subject matter. The instructor gives an explanation and review of label reading, grocery shopping for heart health, heart healthy recipe modifications, and ways to make healthier choices when eating out.   CARDIAC REHAB PHASE II EXERCISE from 10/16/2017 in [facility] CARDIAC REHAB  Date  07/21/17  Educator  RD  Instruction Review Code  Not applicable      Diabetes Question & Answer:  -Group instruction provided by PowerPoint slides, verbal discussion, and written materials to support subject matter. The instructor gives an explanation and review of diabetes co-morbidities, pre- and post-prandial blood glucose goals, pre-exercise blood glucose goals, signs, symptoms, and treatment of hypoglycemia and hyperglycemia, and foot care basics.   CARDIAC REHAB PHASE II EXERCISE from 10/16/2017 in [facility] CARDIAC REHAB  Date  10/02/17  Educator  RD  Instruction Review Code  2- meets goals/outcomes      Diabetes Blitz:  -Group instruction provided by PowerPoint slides, verbal discussion, and written materials to support subject matter. The instructor gives an explanation and review of the physiology behind type 1 and type 2 diabetes, diabetes medications and rational  behind using different medications, pre- and post-prandial blood glucose recommendations and Hemoglobin A1c goals, diabetes diet, and exercise including blood glucose guidelines for exercising safely.    CARDIAC REHAB PHASE II EXERCISE from 10/16/2017 in [facility] CARDIAC REHAB  Date  07/21/17  Educator  RD  Instruction Review Code  Not applicable      Portion Distortion:  -Group instruction provided by PowerPoint slides, verbal discussion, written materials, and food models to support subject matter. The instructor gives an explanation of serving  size versus portion size, changes in portions sizes over the last 20 years, and what consists of a serving from each food group.   CARDIAC REHAB PHASE II EXERCISE from 10/16/2017 in [facility] CARDIAC REHAB  Date  08/12/17  Educator  RD  Instruction Review Code  2- meets goals/outcomes      Stress Management:  -Group instruction provided by verbal instruction, video, and written materials to support subject matter.  Instructors review role of stress in heart disease and how to cope with stress positively.     Exercising on Your Own:  -Group instruction provided by verbal instruction, power point, and written materials to support subject.  Instructors discuss benefits of exercise, components of exercise, frequency and intensity of exercise, and end points for exercise.  Also discuss use of nitroglycerin and activating EMS.  Review options of places to exercise outside of rehab.  Review guidelines for sex with heart disease.   CARDIAC REHAB PHASE II EXERCISE from 10/16/2017 in [facility] CARDIAC REHAB  Date  08/26/17  Educator  EP  Instruction Review Code  2- meets goals/outcomes      Cardiac Drugs I:  -Group instruction provided by verbal instruction and written materials to support subject.  Instructor reviews cardiac drug classes: antiplatelets, anticoagulants, beta blockers, and statins.  Instructor discusses reasons, side effects, and lifestyle considerations for each drug class.   CARDIAC REHAB PHASE II EXERCISE from 10/16/2017 in [facility] CARDIAC REHAB  Date  07/29/17  Instruction Review Code  2- meets goals/outcomes      Cardiac Drugs II:  -Group instruction provided by verbal instruction and written materials to support subject.  Instructor reviews cardiac drug classes: angiotensin converting enzyme inhibitors (ACE-I), angiotensin II receptor blockers (ARBs), nitrates, and calcium channel blockers.  Instructor discusses  reasons, side effects, and lifestyle considerations for each drug class.   Anatomy and Physiology of the Circulatory System:  Group verbal and written instruction and models provide basic cardiac anatomy and physiology, with the coronary electrical and arterial systems. Review of: AMI, Angina, Valve disease, Heart Failure, Peripheral Artery Disease, Cardiac Arrhythmia, Pacemakers, and the ICD.   Other Education:  -Group or individual verbal, written, or video instructions that support the educational goals of the cardiac rehab program.   Knowledge Questionnaire Score:     Knowledge Questionnaire Score - 07/21/17 1617      Knowledge Questionnaire Score   Pre Score 21/24      Core Components/Risk Factors/Patient Goals at Admission:     Personal Goals and Risk Factors at Admission - 07/21/17 1443      Core Components/Risk Factors/Patient Goals on Admission    Weight Management Yes;Weight Loss;Weight Maintenance;Obesity   Intervention Weight Management: Develop a combined nutrition and exercise program designed to reach desired caloric intake, while maintaining appropriate intake of nutrient and fiber, sodium and fats, and appropriate energy expenditure required for the weight goal.;Weight Management: Provide education and appropriate resources  to help participant work on and attain dietary goals.;Weight Management/Obesity: Establish reasonable short term and long term weight goals.;Obesity: Provide education and appropriate resources to help participant work on and attain dietary goals.   Expected Outcomes Short Term: Continue to assess and modify interventions until short term weight is achieved;Weight Maintenance: Understanding of the daily nutrition guidelines, which includes 25-35% calories from fat, 7% or less cal from saturated fats, less than 200mg  cholesterol, less than 1.5gm of sodium, & 5 or more servings of fruits and vegetables daily;Long Term: Adherence to nutrition and  physical activity/exercise program aimed toward attainment of established weight goal;Weight Loss: Understanding of general recommendations for a balanced deficit meal plan, which promotes 1-2 lb weight loss per week and includes a negative energy balance of [phone] kcal/d;Understanding recommendations for meals to include 15-35% energy as protein, 25-35% energy from fat, 35-60% energy from carbohydrates, less than 200mg  of dietary cholesterol, 20-35 gm of total fiber daily;Understanding of distribution of calorie intake throughout the day with the consumption of 4-5 meals/snacks;Weight Gain: Understanding of general recommendations for a high calorie, high protein meal plan that promotes weight gain by distributing calorie intake throughout the day with the consumption for 4-5 meals, snacks, and/or supplements   Diabetes Yes   Intervention Provide education about signs/symptoms and action to take for hypo/hyperglycemia.;Provide education about proper nutrition, including hydration, and aerobic/resistive exercise prescription along with prescribed medications to achieve blood glucose in normal ranges: Fasting glucose 65-99 mg/dL   Expected Outcomes Short Term: Participant verbalizes understanding of the signs/symptoms and immediate care of hyper/hypoglycemia, proper foot care and importance of medication, aerobic/resistive exercise and nutrition plan for blood glucose control.;Long Term: Attainment of HbA1C < 7%.   Hypertension Yes   Intervention Provide education on lifestyle modifcations including regular physical activity/exercise, weight management, moderate sodium restriction and increased consumption of fresh fruit, vegetables, and low fat dairy, alcohol moderation, and smoking cessation.;Monitor prescription use compliance.   Expected Outcomes Short Term: Continued assessment and intervention until BP is < 140/58mm HG in hypertensive participants. < 130/35mm HG in hypertensive participants with  diabetes, heart failure or chronic kidney disease.;Long Term: Maintenance of blood pressure at goal levels.   Lipids Yes   Intervention Provide education and support for participant on nutrition & aerobic/resistive exercise along with prescribed medications to achieve LDL 70mg , HDL >40mg .   Expected Outcomes Short Term: Participant states understanding of desired cholesterol values and is compliant with medications prescribed. Participant is following exercise prescription and nutrition guidelines.;Long Term: Cholesterol controlled with medications as prescribed, with individualized exercise RX and with personalized nutrition plan. Value goals: LDL < 70mg , HDL > 40 mg.   Personal Goal Other Yes   Personal Goal Decrease fear avoidance, learn exercise limitations and lose weight. Overall goal is to be heart healthy   Intervention Provide cardiac education classes and exercise programming to assist with building confidence with actvity, and learn activity restrictions to promote a heart healthy lifestyle and    Expected Outcomes Pt will be able to lose wt, decrease fear avoidance and learn activity limitations.      Core Components/Risk Factors/Patient Goals Review:      Goals and Risk Factor Review    Row Name 08/06/17 1006 09/02/17 1648 09/30/17 1620 10/23/17 1205       Core Components/Risk Factors/Patient Goals Review   Personal Goals Review Weight Management/Obesity;Hypertension;Lipids;Diabetes;Other Weight Management/Obesity;Hypertension;Lipids;Diabetes;Other Weight Management/Obesity;Hypertension;Lipids;Diabetes;Other Weight Management/Obesity;Hypertension;Lipids;Diabetes;Other    Review pt with multiple CAD RF demonstrates eagerness to participate in CR activiites to  decrease cardiac risk, fear and learn ways to improve health.   pt with multiple CAD RF demonstrates eagerness to participate in CR activiites to decrease cardiac risk, fear and learn ways to improve health.   pt with multiple  CAD RF demonstrates eagerness to participate in CR activiites to decrease cardiac risk, fear and learn ways to improve health.  pt BP and blood sugar well controlled at cardiac rehab. pt with multiple CAD RF demonstrates eagerness to participate in CR activiites to decrease cardiac risk, fear and learn ways to improve health. pt notes physical activity getting easier. He is walking at home.   pt BP and blood sugar well controlled at cardiac rehab.    Expected Outcomes pt will participate in CR exercise, nutrition and lifestyle edcucation opportunities to decrease overall CAD RF.  pt will participate in CR exercise, nutrition and lifestyle edcucation opportunities to decrease overall CAD RF.  pt will participate in CR exercise, nutrition and lifestyle edcucation opportunities to decrease overall CAD RF.  pt will participate in CR exercise, nutrition and lifestyle edcucation opportunities to decrease overall CAD RF.        Core Components/Risk Factors/Patient Goals at Discharge (Final Review):      Goals and Risk Factor Review - 10/23/17 1205      Core Components/Risk Factors/Patient Goals Review   Personal Goals Review Weight Management/Obesity;Hypertension;Lipids;Diabetes;Other   Review pt with multiple CAD RF demonstrates eagerness to participate in CR activiites to decrease cardiac risk, fear and learn ways to improve health. pt notes physical activity getting easier. He is walking at home.   pt BP and blood sugar well controlled at cardiac rehab.   Expected Outcomes pt will participate in CR exercise, nutrition and lifestyle edcucation opportunities to decrease overall CAD RF.       ITP Comments:     ITP Comments    Row Name 07/21/17 1421 08/06/17 1005 09/02/17 1647 09/30/17 1618 10/23/17 1205   ITP Comments Dr. Armanda Magic, Medical Director Dr. Armanda Magic, Medical Director Dr. Armanda Magic, Medical Director 30 ITP review. pt with good attendance and participation.   30 ITP review. pt  with good attendance and participation.        Comments:

## 2017-10-26 ENCOUNTER — Encounter (HOSPITAL_COMMUNITY)
Admission: RE | Admit: 2017-10-26 | Discharge: 2017-10-26 | Disposition: A | Discharge status: Home / Self Care | Payer: BLUE CROSS/BLUE SHIELD | Source: Ambulatory Visit | Attending: Cardiovascular Disease | Admitting: Cardiovascular Disease

## 2017-10-26 DIAGNOSIS — Z951 Presence of aortocoronary bypass graft: Secondary | ICD-10-CM

## 2017-10-26 DIAGNOSIS — Z48812 Encounter for surgical aftercare following surgery on the circulatory system: Secondary | ICD-10-CM | POA: Diagnosis not present

## 2017-10-27 ENCOUNTER — Telehealth (HOSPITAL_COMMUNITY): Payer: Self-pay

## 2017-10-27 NOTE — Telephone Encounter (Signed)
-----   Message from Chilton Siiffany [place] Aguirre, MD sent at 10/03/2017 10:38 PM EDT ----- I agree.  Tiffany C. Duke Salviaandolph, MD, [facility]   ----- Message ----- From: Warrick ParisianFair, Akshat Minehart D Sent: 10/02/2017   8:31 AM To: Chilton Siiffany [place], MD  Patient has been in cardiac rehab approximately 10 weeks and is doing well. BP's WNL's, but HR's are beginning to exceed Target Heart Rate (THR) of 66-132 bpm (40%-80% of age predicted max HR). If no GXT is planned for the near future and MD agrees, request to increase THR to 40-90% of age predicted max HR 66-148bpm.     Thank you   [employer] Mesiah Manzo

## 2017-10-27 NOTE — Telephone Encounter (Signed)
-----   Message from Chilton Siiffany [place], MD sent at 10/14/2017  4:02 PM EDT ----- Regarding: RE: strength training request He can absolutely start strength training.  ----- Message ----- From: Warrick ParisianFair, Jodeci Rini D Sent: 10/12/2017   5:10 PM To: Chilton Siiffany [place], MD Subject: strength training request                      Patient has been in cardiac rehab approximately 3 months and doing is well. Pt has been averaging 4-5 METS and BP/HR has been in stabled with exercise. Pt is interested in doing strength training. If you feel this patient is appropriate to begin strength training please f/u with cardiac rehab staff.   Vishwa Dais Genuine PartsFair,MS,ACSM RCEP

## 2017-10-28 ENCOUNTER — Encounter (HOSPITAL_COMMUNITY): Payer: BLUE CROSS/BLUE SHIELD

## 2017-10-30 ENCOUNTER — Encounter (HOSPITAL_COMMUNITY)
Admission: RE | Admit: 2017-10-30 | Discharge: 2017-10-30 | Disposition: A | Discharge status: Home / Self Care | Payer: BLUE CROSS/BLUE SHIELD | Source: Ambulatory Visit | Attending: Cardiovascular Disease | Admitting: Cardiovascular Disease

## 2017-10-30 DIAGNOSIS — Z48812 Encounter for surgical aftercare following surgery on the circulatory system: Secondary | ICD-10-CM | POA: Diagnosis not present

## 2017-10-30 DIAGNOSIS — Z951 Presence of aortocoronary bypass graft: Secondary | ICD-10-CM

## 2017-11-02 ENCOUNTER — Encounter (HOSPITAL_COMMUNITY)
Admission: RE | Admit: 2017-11-02 | Discharge: 2017-11-02 | Disposition: A | Discharge status: Home / Self Care | Payer: BLUE CROSS/BLUE SHIELD | Source: Ambulatory Visit | Attending: Cardiovascular Disease | Admitting: Cardiovascular Disease

## 2017-11-02 DIAGNOSIS — Z48812 Encounter for surgical aftercare following surgery on the circulatory system: Secondary | ICD-10-CM | POA: Diagnosis not present

## 2017-11-02 DIAGNOSIS — Z951 Presence of aortocoronary bypass graft: Secondary | ICD-10-CM

## 2017-11-04 ENCOUNTER — Encounter (HOSPITAL_COMMUNITY): Payer: BLUE CROSS/BLUE SHIELD

## 2017-11-06 ENCOUNTER — Encounter (HOSPITAL_COMMUNITY)
Admission: RE | Admit: 2017-11-06 | Discharge: 2017-11-06 | Disposition: A | Discharge status: Home / Self Care | Payer: BLUE CROSS/BLUE SHIELD | Source: Ambulatory Visit | Attending: Cardiovascular Disease | Admitting: Cardiovascular Disease

## 2017-11-06 ENCOUNTER — Encounter (HOSPITAL_COMMUNITY): Admission: RE | Admit: 2017-11-06 | Payer: BLUE CROSS/BLUE SHIELD | Source: Ambulatory Visit

## 2017-11-06 VITALS — Ht 74.0 in | Wt 295.9 lb

## 2017-11-06 DIAGNOSIS — Z48812 Encounter for surgical aftercare following surgery on the circulatory system: Secondary | ICD-10-CM | POA: Diagnosis not present

## 2017-11-06 DIAGNOSIS — Z951 Presence of aortocoronary bypass graft: Secondary | ICD-10-CM

## 2017-11-09 ENCOUNTER — Encounter (HOSPITAL_COMMUNITY)
Admission: RE | Admit: 2017-11-09 | Discharge: 2017-11-09 | Disposition: A | Discharge status: Home / Self Care | Payer: BLUE CROSS/BLUE SHIELD | Source: Ambulatory Visit | Attending: Cardiovascular Disease | Admitting: Cardiovascular Disease

## 2017-11-09 DIAGNOSIS — Z951 Presence of aortocoronary bypass graft: Secondary | ICD-10-CM

## 2017-11-09 DIAGNOSIS — Z48812 Encounter for surgical aftercare following surgery on the circulatory system: Secondary | ICD-10-CM | POA: Diagnosis not present

## 2017-11-11 ENCOUNTER — Encounter (HOSPITAL_COMMUNITY)
Admission: RE | Admit: 2017-11-11 | Discharge: 2017-11-11 | Disposition: A | Discharge status: Home / Self Care | Payer: BLUE CROSS/BLUE SHIELD | Source: Ambulatory Visit | Attending: Cardiovascular Disease | Admitting: Cardiovascular Disease

## 2017-11-11 ENCOUNTER — Encounter (HOSPITAL_COMMUNITY): Payer: Self-pay

## 2017-11-11 DIAGNOSIS — Z951 Presence of aortocoronary bypass graft: Secondary | ICD-10-CM

## 2017-11-11 DIAGNOSIS — Z48812 Encounter for surgical aftercare following surgery on the circulatory system: Secondary | ICD-10-CM | POA: Diagnosis not present

## 2017-11-30 ENCOUNTER — Encounter (HOSPITAL_COMMUNITY): Payer: Self-pay

## 2017-12-01 NOTE — Progress Notes (Signed)
Discharge Progress Report  Patient Details  Name: [name] Hammond MRN: [id number] Date of Birth: 02/03/1961 Referring Provider:     CARDIAC REHAB PHASE II ORIENTATION from 07/21/2017 in [facility]ARD[facility]AC [facility]  Referring Provider  Chilton Si MD       Number of Visits: 36   Reason for Discharge: pt reached stable level of exercise.    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)  ADL [facility]:   Initial Exercise Prescription: Initial Exercise Prescription - 07/21/17 1600      Date of Initial Exercise RX and Referring Provider   Date  07/21/17    Referring Provider  Chilton Si MD      Treadmill   MPH  3    Grade  0    Minutes  10    METs  3.3      Bike   Level  1    Minutes  10    METs  2.4      NuStep   Level  3    SPM  80    Minutes  10    METs  2      Prescription Details   Frequency (times per week)  3    Duration  Progress to 30 minutes of continuous aerobic without signs/symptoms of physical distress      Intensity   THRR 40-80% of Max Heartrate  66-132    Ratings of Perceived Exertion  11-13    Perceived Dyspnea  0-4      Progression   Progression  Continue progressive overload as per policy without signs/symptoms or physical distress.      Resistance Training   Training Prescription  Yes    Weight  3lbs    Reps  10-15       Discharge Exercise Prescription (Final Exercise Prescription Changes): Exercise Prescription Changes - 11/23/17 1000      Response to Exercise   Blood Pressure (Admit)  132/80    Blood Pressure (Exercise)  -- Pt stabled, no exercise BP recorded    Blood Pressure (Exit)  104/62    Heart Rate (Admit)  90 bpm    Heart Rate (Exercise)  157 bpm    Heart Rate (Exit)  90 bpm    Rating of Perceived Exertion (Exercise)  11    Symptoms  none    Comments  pt does cable  column weight machine for 10 minutes    Duration  Continue with 30 min of aerobic exercise without signs/symptoms of physical distress.    Intensity  THRR unchanged      Progression   Progression  Continue to progress workloads to maintain intensity without signs/symptoms of physical distress.    Average METs  4.6      Resistance Training   Training Prescription  No relaxation day    Weight  --    Reps  --    Time  --      Treadmill   MPH  3.4    Grade  4    Minutes  15    METs  5.48      Bike   Level  2    Minutes  15    METs  3.82      NuStep   Level  --    SPM  --  Minutes  --    METs  --      Home Exercise Plan   Plans to continue exercise at  [facility] (comment) [employer]    Frequency  Add 2 additional days to program exercise sessions.    Initial Home Exercises Provided  08/14/17       Functional Capacity: 6 Minute Walk    Row Name 07/21/17 1557 11/06/17 1518       6 Minute Walk   Phase  Initial  Discharge    Distance  1692 feet  2337 feet    Distance % Change  -  38.12 %    Distance Feet Change  -  645 ft    Walk Time  6 minutes  6 minutes    # of Rest Breaks  0  6    MPH  3.2  4.4    METS  4.08  5.7    RPE  13  12    VO2 Peak  14.29  19.9    Symptoms  -  No    Resting HR  71 bpm  80 bpm    Resting BP  122/64  134/82    Max Ex. HR  128 bpm  142 bpm    Max Ex. BP  140/70  174/74    2 Minute Post BP  114/82  160/74 10 minutes post 120/70       Psychological, QOL, Others - Outcomes: PHQ 2/9: Depression screen [facility] 2/9 11/11/2017 07/27/2017  Decreased Interest 0 1  Down, Depressed, Hopeless 0 1  PHQ - 2 Score 0 2  Altered sleeping - 1  Tired, decreased energy - 1  Change in appetite - 1  Feeling bad or failure about yourself  - 1  Trouble concentrating - 0  Moving slowly or fidgety/restless - 0  Suicidal thoughts - 0  PHQ-9 Score - 6  Difficult doing work/chores - Somewhat difficult    Quality of Life: Quality of Life -  12/01/17 1120      Quality of Life Scores   Health/Function Pre  -- pt did not complete QOL questionnaire       Personal Goals: Goals established at orientation with interventions provided to work toward goal. Personal Goals and Risk Factors at Admission - 07/21/17 1443      Core Components/Risk Factors/Patient Goals on Admission    Weight Management  Yes;Weight Loss;Weight Maintenance;Obesity    Intervention  Weight Management: Develop a combined nutrition and exercise program designed to reach desired caloric intake, while maintaining appropriate intake of nutrient and fiber, sodium and fats, and appropriate energy expenditure required for the weight goal.;Weight Management: Provide education and appropriate resources to help participant work on and attain dietary goals.;Weight Management/Obesity: Establish reasonable short term and long term weight goals.;Obesity: Provide education and appropriate resources to help participant work on and attain dietary goals.    Expected Outcomes  Short Term: Continue to assess and modify interventions until short term weight is achieved;Weight Maintenance: Understanding of the daily nutrition guidelines, which includes 25-35% calories from fat, 7% or less cal from saturated fats, less than 200mg  cholesterol, less than 1.5gm of sodium, & 5 or more servings of fruits and vegetables daily;Long Term: Adherence to nutrition and physical activity/exercise program aimed toward attainment of established weight goal;Weight Loss: Understanding of general recommendations for a balanced deficit meal plan, which promotes 1-2 lb weight loss per week and includes a negative energy balance of [phone] kcal/d;Understanding recommendations for meals  to include 15-35% energy as protein, 25-35% energy from fat, 35-60% energy from carbohydrates, less than 200mg  of dietary cholesterol, 20-35 gm of total fiber daily;Understanding of distribution of calorie intake throughout the day with  the consumption of 4-5 meals/snacks;Weight Gain: Understanding of general recommendations for a high calorie, high protein meal plan that promotes weight gain by distributing calorie intake throughout the day with the consumption for 4-5 meals, snacks, and/or supplements    Diabetes  Yes    Intervention  Provide education about signs/symptoms and action to take for hypo/hyperglycemia.;Provide education about proper nutrition, including hydration, and aerobic/resistive exercise prescription along with prescribed medications to achieve blood glucose in normal ranges: Fasting glucose 65-99 mg/dL    Expected Outcomes  Short Term: Participant verbalizes understanding of the signs/symptoms and immediate care of hyper/hypoglycemia, proper foot care and importance of medication, aerobic/resistive exercise and nutrition plan for blood glucose control.;Long Term: Attainment of HbA1C < 7%.    Hypertension  Yes    Intervention  Provide education on lifestyle modifcations including regular physical activity/exercise, weight management, moderate sodium restriction and increased consumption of fresh fruit, vegetables, and low fat dairy, alcohol moderation, and smoking cessation.;Monitor prescription use compliance.    Expected Outcomes  Short Term: Continued assessment and intervention until BP is < 140/6690mm HG in hypertensive participants. < 130/6680mm HG in hypertensive participants with diabetes, heart failure or chronic kidney disease.;Long Term: Maintenance of blood pressure at goal levels.    Lipids  Yes    Intervention  Provide education and support for participant on nutrition & aerobic/resistive exercise along with prescribed medications to achieve LDL 70mg , HDL >40mg .    Expected Outcomes  Short Term: Participant states understanding of desired cholesterol values and is compliant with medications prescribed. Participant is following exercise prescription and nutrition guidelines.;Long Term: Cholesterol controlled  with medications as prescribed, with individualized exercise RX and with personalized nutrition plan. Value goals: LDL < 70mg , HDL > 40 mg.    Personal Goal Other  Yes    Personal Goal  Decrease fear avoidance, learn exercise limitations and lose weight. Overall goal is to be heart healthy    Intervention  Provide cardiac education classes and exercise programming to assist with building confidence with actvity, and learn activity restrictions to promote a heart healthy lifestyle and     Expected Outcomes  Pt will be able to lose wt, decrease fear avoidance and learn activity limitations.        Personal Goals Discharge: Goals and Risk Factor Review    Row Name 08/06/17 1006 09/02/17 1648 09/30/17 1620 10/23/17 1205 11/11/17 1508     Core Components/Risk Factors/Patient Goals Review   Personal Goals Review  Weight Management/Obesity;Hypertension;Lipids;Diabetes;Other  Weight Management/Obesity;Hypertension;Lipids;Diabetes;Other  Weight Management/Obesity;Hypertension;Lipids;Diabetes;Other  Weight Management/Obesity;Hypertension;Lipids;Diabetes;Other  -   Review  pt with multiple CAD RF demonstrates eagerness to participate in CR activiites to decrease cardiac risk, fear and learn ways to improve health.    pt with multiple CAD RF demonstrates eagerness to participate in CR activiites to decrease cardiac risk, fear and learn ways to improve health.    pt with multiple CAD RF demonstrates eagerness to participate in CR activiites to decrease cardiac risk, fear and learn ways to improve health.  pt BP and blood sugar well controlled at cardiac rehab.  pt with multiple CAD RF demonstrates eagerness to participate in CR activiites to decrease cardiac risk, fear and learn ways to improve health. pt notes physical activity getting easier. He is walking at home.  pt BP and blood sugar well controlled at cardiac rehab.  pt encouraged to continue risk factor modification activities to improve overall health.   pt pleased that he has increased exercise tolerance and developed routine.  pt plans to continue exercising in cardaic maintenance program.    Expected Outcomes  pt will participate in CR exercise, nutrition and lifestyle edcucation opportunities to decrease overall CAD RF.   pt will participate in CR exercise, nutrition and lifestyle edcucation opportunities to decrease overall CAD RF.   pt will participate in CR exercise, nutrition and lifestyle edcucation opportunities to decrease overall CAD RF.   pt will participate in CR exercise, nutrition and lifestyle edcucation opportunities to decrease overall CAD RF.   pt will participate in  exercise, nutrition and lifestyle edcucation opportunities to decrease overall CAD RF.    Row Name 11/25/17 [id number]             Core Components/Risk Factors/Patient Goals Review   Personal Goals Review  Weight Management/Obesity       Review  see nutrition section          Exercise Goals and Review: Exercise Goals    Row Name 07/21/17 1440             Exercise Goals   Increase Physical Activity  Yes       Intervention  Provide advice, education, support and counseling about physical activity/exercise needs.;Develop an individualized exercise prescription for aerobic and resistive training based on initial evaluation findings, risk stratification, comorbidities and participant's personal goals.       Expected Outcomes  Achievement of increased cardiorespiratory fitness and enhanced flexibility, muscular endurance and strength shown through measurements of functional capacity and personal statement of participant.       Increase Strength and Stamina  Yes       Intervention  Provide advice, education, support and counseling about physical activity/exercise needs.;Develop an individualized exercise prescription for aerobic and resistive training based on initial evaluation findings, risk stratification, comorbidities and participant's personal goals.        Expected Outcomes  Achievement of increased cardiorespiratory fitness and enhanced flexibility, muscular endurance and strength shown through measurements of functional capacity and personal statement of participant.          Nutrition & Weight - Outcomes: Pre Biometrics - 07/21/17 1555      Pre Biometrics   Waist Circumference  44.75 inches    Hip Circumference  50 inches    Waist to Hip Ratio  0.9 %    Triceps Skinfold  31 mm    % Body Fat  35.3 %    Grip Strength  38 kg    Flexibility  10 in    Single Leg Stand  25 seconds      Post Biometrics - 11/06/17 1523       Post  Biometrics   Height  6\' 2"  (1.88 m)    Weight  295 lb 13.7 oz (134.2 kg)    Waist Circumference  45 inches    Hip Circumference  50.25 inches    Waist to Hip Ratio  0.9 %    BMI (Calculated)  37.97    Triceps Skinfold  32 mm    % Body Fat  35.6 %    Grip Strength  43 kg    Flexibility  12 in    Single Leg Stand  13.31 seconds       Nutrition: Nutrition Therapy & Goals - 07/21/17 [id number]  Nutrition Therapy   Diet  Carb Modified, Therapeutic Lifestyle Changes      Personal Nutrition Goals   Nutrition Goal  Wt loss of 1-2 lb/week to a wt loss goal of 6-24 lb at graduation from Cardiac Rehab.       Intervention Plan   Intervention  Prescribe, educate and counsel regarding individualized specific dietary modifications aiming towards targeted core components such as weight, hypertension, lipid management, diabetes, heart failure and other comorbidities.    Expected Outcomes  Short Term Goal: Understand basic principles of dietary content, such as calories, fat, sodium, cholesterol and nutrients.;Long Term Goal: Adherence to prescribed nutrition plan.       Nutrition Discharge: Nutrition Assessments - 07/21/17 1602      MEDFICTS Scores   Pre Score  39       Education Questionnaire Score: Knowledge Questionnaire Score - 07/21/17 1617      Knowledge Questionnaire Score   Pre Score  21/24        Goals reviewed with patient; copy given to patient.

## 2017-12-02 ENCOUNTER — Encounter (HOSPITAL_COMMUNITY): Payer: Self-pay

## 2017-12-04 ENCOUNTER — Encounter (HOSPITAL_COMMUNITY): Payer: Self-pay
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
## 2017-12-07 ENCOUNTER — Encounter (HOSPITAL_COMMUNITY): Payer: Self-pay | Attending: Cardiovascular Disease

## 2017-12-07 ENCOUNTER — Ambulatory Visit (INDEPENDENT_AMBULATORY_CARE_PROVIDER_SITE_OTHER): Payer: BLUE CROSS/BLUE SHIELD | Admitting: Nurse Practitioner

## 2017-12-07 ENCOUNTER — Telehealth: Payer: Self-pay

## 2017-12-07 ENCOUNTER — Encounter: Payer: Self-pay | Admitting: Nurse Practitioner

## 2017-12-07 VITALS — BP 141/80 | HR 62 | Ht 74.0 in | Wt 296.6 lb

## 2017-12-07 DIAGNOSIS — Z9989 Dependence on other enabling machines and devices: Secondary | ICD-10-CM

## 2017-12-07 DIAGNOSIS — G4733 Obstructive sleep apnea (adult) (pediatric): Secondary | ICD-10-CM | POA: Diagnosis not present

## 2017-12-07 NOTE — Patient Instructions (Signed)
Will see [name] Hammond in sleep lab for nasal pillows fit Follow up in 3 months for repeat compliance

## 2017-12-07 NOTE — Progress Notes (Signed)
I agree with the assessment and plan as directed by NP .The patient is known to me .   Aidel Davisson, MD  

## 2017-12-07 NOTE — Telephone Encounter (Signed)
Patient came to sleep lab for a mask fitting. He is currently using the F&P simplus full face mask. It leaves a sore on his nose. I fitted him with the dreamware full face mask and hooked him up to cpap. There was a 5 leak. Good fit and he liked it much better. This mask goes under the nose. I instructed patient on how to put it on and take off.

## 2017-12-09 ENCOUNTER — Encounter (HOSPITAL_COMMUNITY): Payer: Self-pay

## 2017-12-11 ENCOUNTER — Encounter (HOSPITAL_COMMUNITY): Payer: Self-pay

## 2017-12-16 ENCOUNTER — Encounter (HOSPITAL_COMMUNITY): Payer: Self-pay

## 2017-12-18 ENCOUNTER — Encounter (HOSPITAL_COMMUNITY): Payer: Self-pay

## 2017-12-21 ENCOUNTER — Encounter (HOSPITAL_COMMUNITY): Payer: Self-pay

## 2017-12-23 ENCOUNTER — Encounter (HOSPITAL_COMMUNITY): Payer: Self-pay | Attending: Cardiovascular Disease

## 2017-12-25 ENCOUNTER — Encounter (HOSPITAL_COMMUNITY): Payer: Self-pay

## 2017-12-28 ENCOUNTER — Encounter (HOSPITAL_COMMUNITY): Payer: Self-pay

## 2017-12-30 ENCOUNTER — Encounter (HOSPITAL_COMMUNITY): Payer: Self-pay

## 2018-01-01 ENCOUNTER — Encounter (HOSPITAL_COMMUNITY): Payer: Self-pay

## 2018-01-04 ENCOUNTER — Encounter (HOSPITAL_COMMUNITY): Payer: Self-pay

## 2018-01-06 ENCOUNTER — Encounter (HOSPITAL_COMMUNITY): Payer: Self-pay

## 2018-01-08 ENCOUNTER — Encounter (HOSPITAL_COMMUNITY): Payer: Self-pay

## 2018-01-11 ENCOUNTER — Encounter (HOSPITAL_COMMUNITY): Payer: Self-pay

## 2018-01-13 ENCOUNTER — Encounter (HOSPITAL_COMMUNITY): Payer: Self-pay

## 2018-01-15 ENCOUNTER — Encounter (HOSPITAL_COMMUNITY): Payer: Self-pay

## 2018-01-18 ENCOUNTER — Encounter (HOSPITAL_COMMUNITY): Payer: Self-pay

## 2018-01-20 ENCOUNTER — Encounter (HOSPITAL_COMMUNITY): Payer: Self-pay

## 2018-01-22 ENCOUNTER — Encounter (HOSPITAL_COMMUNITY): Payer: Self-pay

## 2018-01-25 ENCOUNTER — Encounter (HOSPITAL_COMMUNITY): Payer: Self-pay

## 2018-01-27 ENCOUNTER — Encounter (HOSPITAL_COMMUNITY): Payer: Self-pay

## 2018-01-29 ENCOUNTER — Encounter (HOSPITAL_COMMUNITY): Payer: Self-pay

## 2018-02-01 ENCOUNTER — Encounter (HOSPITAL_COMMUNITY): Payer: Self-pay

## 2018-02-03 ENCOUNTER — Encounter (HOSPITAL_COMMUNITY): Payer: Self-pay

## 2018-02-05 ENCOUNTER — Encounter (HOSPITAL_COMMUNITY): Payer: Self-pay

## 2018-02-08 ENCOUNTER — Encounter (HOSPITAL_COMMUNITY): Payer: Self-pay

## 2018-02-10 ENCOUNTER — Encounter (HOSPITAL_COMMUNITY): Payer: Self-pay

## 2018-02-12 ENCOUNTER — Encounter (HOSPITAL_COMMUNITY): Payer: Self-pay

## 2018-02-15 ENCOUNTER — Encounter (HOSPITAL_COMMUNITY): Payer: Self-pay

## 2018-02-17 ENCOUNTER — Encounter (HOSPITAL_COMMUNITY): Payer: Self-pay

## 2018-02-19 ENCOUNTER — Encounter (HOSPITAL_COMMUNITY): Payer: Self-pay

## 2018-02-22 ENCOUNTER — Encounter (HOSPITAL_COMMUNITY): Payer: Self-pay

## 2018-02-24 ENCOUNTER — Encounter (HOSPITAL_COMMUNITY): Payer: Self-pay

## 2018-02-26 ENCOUNTER — Encounter (HOSPITAL_COMMUNITY): Payer: Self-pay

## 2018-03-01 ENCOUNTER — Encounter (HOSPITAL_COMMUNITY): Payer: Self-pay

## 2018-03-03 ENCOUNTER — Encounter (HOSPITAL_COMMUNITY): Payer: Self-pay

## 2018-03-04 IMAGING — DX DG CHEST 1V PORT
1 series · 1 of 1 positions shown · non-contrast
Comparison: Portable exam 7447 hours compared to 05/09/2017

CLINICAL DATA: Broke post CABG

EXAM:
PORTABLE CHEST 1 VIEW

[chest ap]
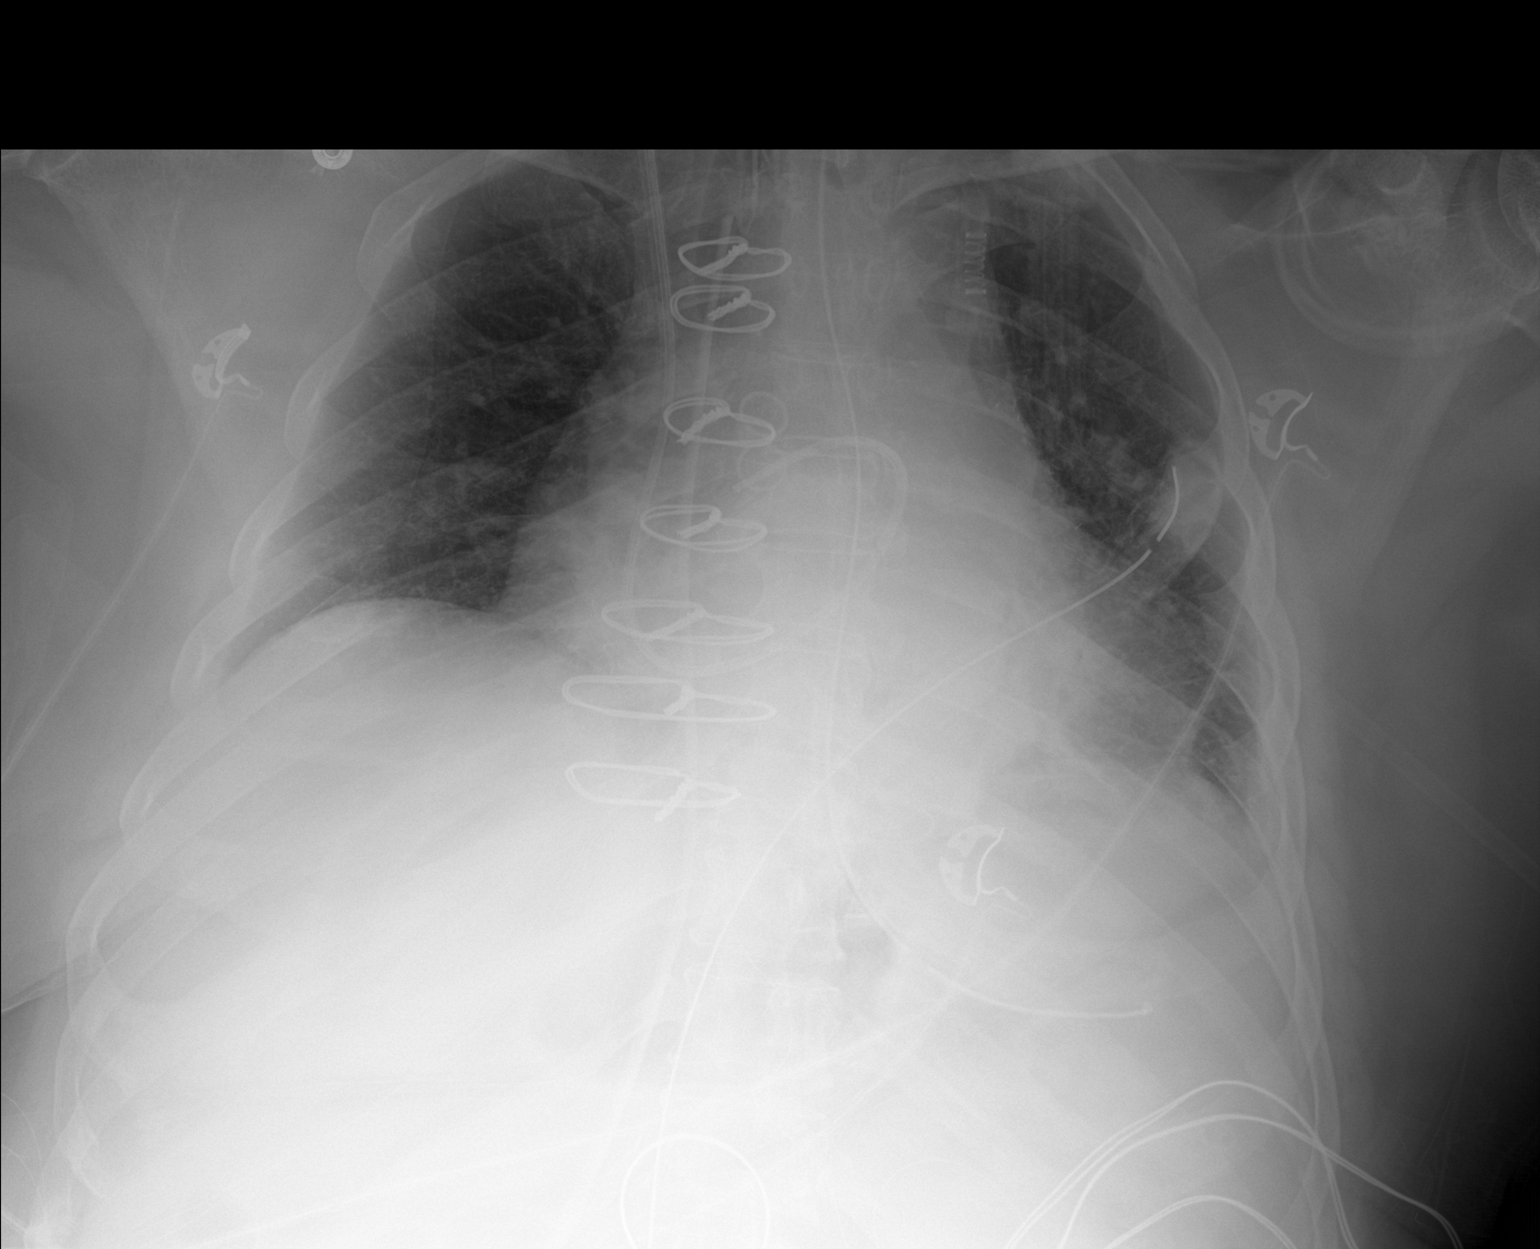

[1 of 1 positions shown; findings below may reference images not displayed]

FINDINGS: Tip of endotracheal tube projects 5.7 cm above carina.

Nasogastric tube extends into stomach.

RIGHT jugular Swan-Ganz catheter with tip projecting over RIGHT
pulmonary artery at hilum.

Mediastinal drain and LEFT thoracostomy tube present.

Enlargement of cardiac silhouette and prominence mediastinum
compatible with surgery.

Atelectasis at LEFT lower lobe.

No definite infiltrate, pleural effusion, or pneumothorax.
IMPRESSION: Postoperative tubes and lines as above.

Enlargement of cardiac silhouette with atelectasis in LEFT lower
lobe.
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
## 2018-03-05 ENCOUNTER — Encounter (HOSPITAL_COMMUNITY): Payer: Self-pay

## 2018-03-07 ENCOUNTER — Encounter: Payer: Self-pay | Admitting: Nurse Practitioner

## 2018-03-07 IMAGING — DX DG CHEST 1V PORT
1 series · 1 of 1 positions shown · non-contrast
Comparison: 05/14/2017 .

CLINICAL DATA: Shortness of breath .

EXAM:
PORTABLE CHEST 1 VIEW

[chest ap]
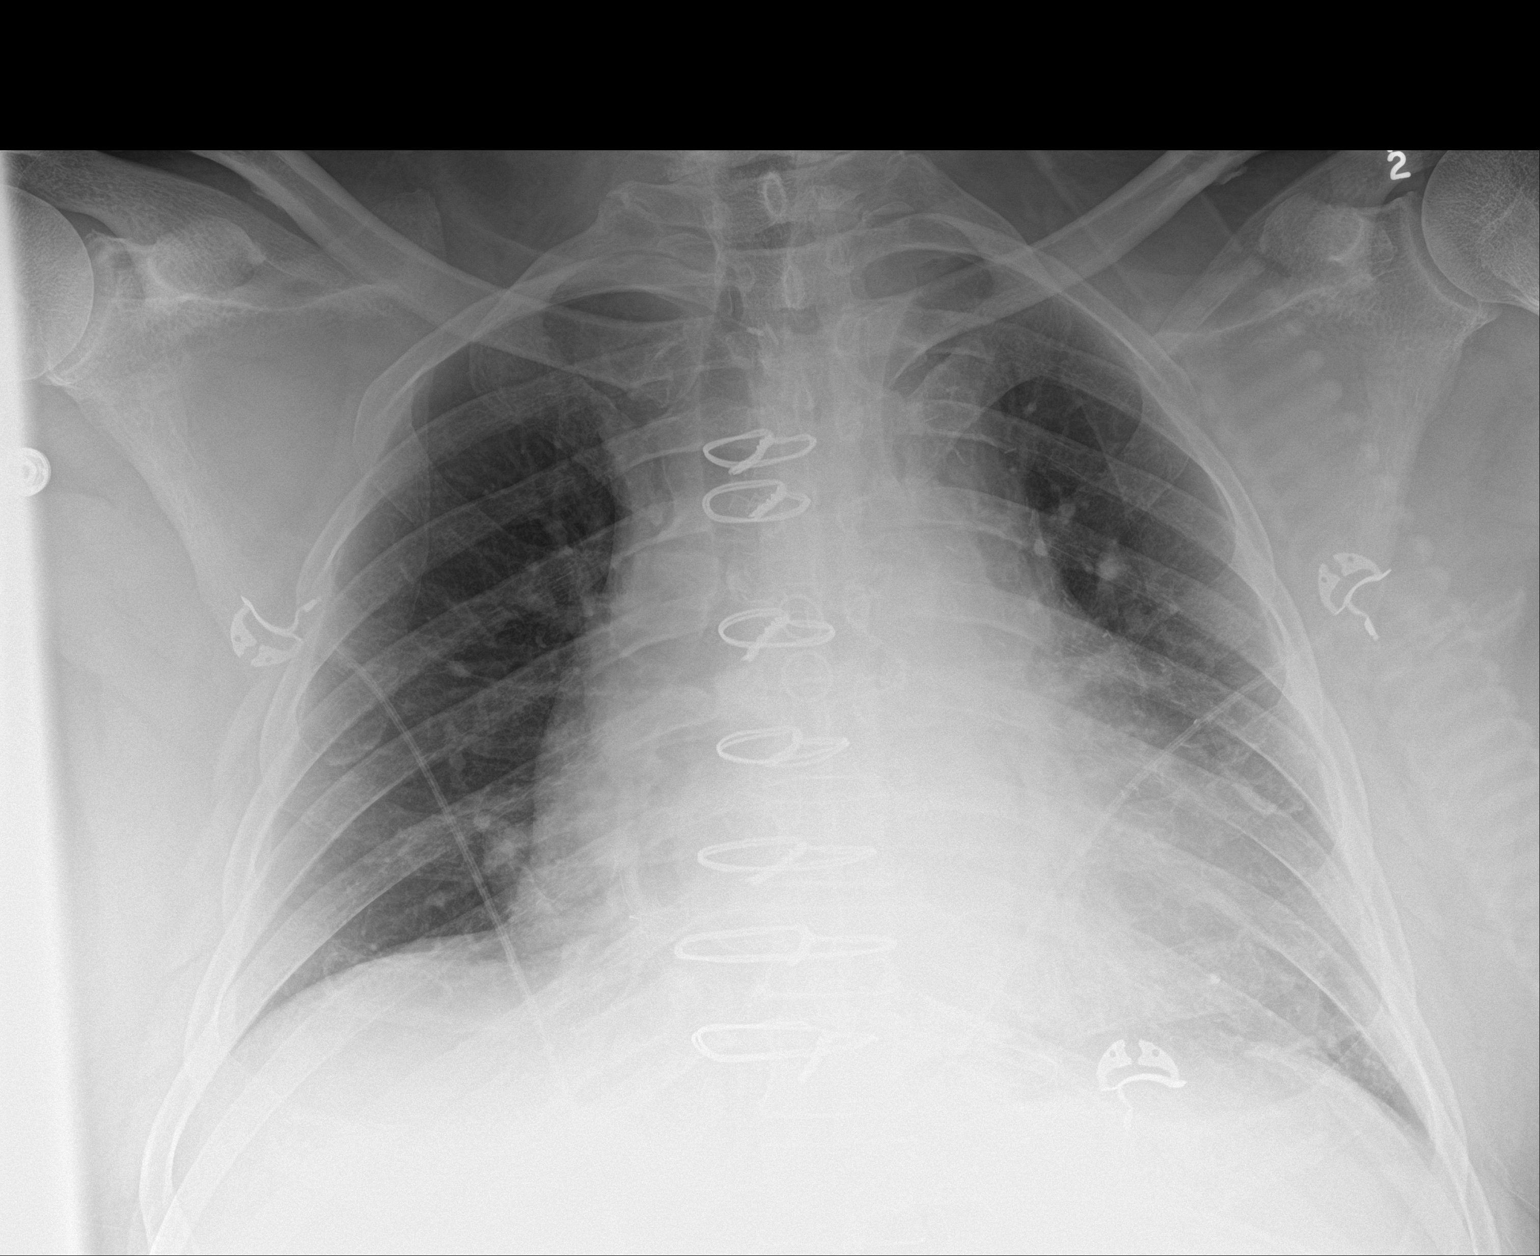

[1 of 1 positions shown; findings below may reference images not displayed]

FINDINGS: Interval removal of right IJ sheath, mediastinal drainage catheter,
and left chest tube. Prior CABG. Cardiomegaly. Low lung volumes with
mild basilar subsegmental atelectasis. No pleural effusion or
pneumothorax.
IMPRESSION: 1. Interval removal of lines and tubes including left chest tube. No
pneumothorax.

2.  Prior CABG.  Stable cardiomegaly.  No CHF.

3. Low lung volumes with mild basilar subsegmental atelectasis .

## 2018-03-08 ENCOUNTER — Encounter (INDEPENDENT_AMBULATORY_CARE_PROVIDER_SITE_OTHER): Payer: Self-pay

## 2018-03-08 ENCOUNTER — Encounter (HOSPITAL_COMMUNITY): Payer: Self-pay

## 2018-03-08 ENCOUNTER — Ambulatory Visit (INDEPENDENT_AMBULATORY_CARE_PROVIDER_SITE_OTHER): Payer: Managed Care, Other (non HMO) | Admitting: Nurse Practitioner

## 2018-03-08 ENCOUNTER — Encounter: Payer: Self-pay | Admitting: Nurse Practitioner

## 2018-03-08 VITALS — BP 123/76 | HR 64 | Wt 302.2 lb

## 2018-03-08 DIAGNOSIS — Z9989 Dependence on other enabling machines and devices: Secondary | ICD-10-CM | POA: Diagnosis not present

## 2018-03-08 DIAGNOSIS — G4733 Obstructive sleep apnea (adult) (pediatric): Secondary | ICD-10-CM | POA: Diagnosis not present

## 2018-03-08 NOTE — Progress Notes (Signed)
I agree with the assessment and plan as directed by NP .The patient is known to me .   Abenezer Odonell, MD  

## 2018-03-08 NOTE — Patient Instructions (Signed)
Continue using nasal pillows make sure they are in the nose Compliance data at 83% which is improved  Follow-up in 3 months for repeat compliance

## 2018-03-10 ENCOUNTER — Encounter (HOSPITAL_COMMUNITY): Payer: Self-pay

## 2018-03-12 ENCOUNTER — Encounter (HOSPITAL_COMMUNITY): Payer: Self-pay

## 2018-03-15 ENCOUNTER — Encounter (HOSPITAL_COMMUNITY): Payer: Self-pay

## 2018-03-17 ENCOUNTER — Encounter (HOSPITAL_COMMUNITY): Payer: Self-pay

## 2018-03-19 ENCOUNTER — Encounter (HOSPITAL_COMMUNITY): Payer: Self-pay

## 2018-03-22 ENCOUNTER — Encounter (HOSPITAL_COMMUNITY): Payer: Self-pay

## 2018-03-24 ENCOUNTER — Encounter (HOSPITAL_COMMUNITY): Payer: Self-pay

## 2018-03-26 ENCOUNTER — Encounter (HOSPITAL_COMMUNITY): Payer: Self-pay

## 2018-03-29 ENCOUNTER — Encounter (HOSPITAL_COMMUNITY): Payer: Self-pay

## 2018-03-31 ENCOUNTER — Encounter (HOSPITAL_COMMUNITY): Payer: Self-pay

## 2018-04-02 ENCOUNTER — Encounter (HOSPITAL_COMMUNITY): Payer: Self-pay

## 2018-04-05 ENCOUNTER — Encounter (HOSPITAL_COMMUNITY): Payer: Self-pay

## 2018-04-07 ENCOUNTER — Encounter (HOSPITAL_COMMUNITY): Payer: Self-pay

## 2018-04-09 ENCOUNTER — Encounter (HOSPITAL_COMMUNITY): Payer: Self-pay

## 2018-04-12 ENCOUNTER — Encounter (HOSPITAL_COMMUNITY): Payer: Self-pay

## 2018-04-14 ENCOUNTER — Encounter (HOSPITAL_COMMUNITY): Payer: Self-pay

## 2018-04-16 ENCOUNTER — Encounter (HOSPITAL_COMMUNITY): Payer: Self-pay

## 2018-04-19 ENCOUNTER — Encounter (HOSPITAL_COMMUNITY): Payer: Self-pay

## 2018-04-21 ENCOUNTER — Encounter (HOSPITAL_COMMUNITY): Payer: Self-pay

## 2018-04-23 ENCOUNTER — Encounter (HOSPITAL_COMMUNITY): Payer: Self-pay

## 2018-04-26 ENCOUNTER — Encounter (HOSPITAL_COMMUNITY): Payer: Self-pay

## 2018-04-26 LAB — CBC AND DIFFERENTIAL
HCT: 44 (ref 41–53)
Hemoglobin: 14.3 (ref 13.5–17.5)
Platelets: 299 (ref 150–399)
WBC: 8.9

## 2018-04-26 LAB — BASIC METABOLIC PANEL
BUN: 13 (ref 4–21)
CREATININE: 0.9 (ref 0.6–1.3)
Glucose: 117
Potassium: 4.2 (ref 3.4–5.3)
Sodium: 140 (ref 137–147)

## 2018-04-26 LAB — VITAMIN D 25 HYDROXY (VIT D DEFICIENCY, FRACTURES): Vit D, 25-Hydroxy: 32.5

## 2018-04-26 LAB — TSH: TSH: 0.97 (ref 0.41–5.90)

## 2018-04-26 LAB — HEPATIC FUNCTION PANEL
ALT: 20 (ref 10–40)
AST: 20 (ref 14–40)
Alkaline Phosphatase: 108 (ref 25–125)
Bilirubin, Total: 0.3

## 2018-04-26 LAB — PSA: PSA: 0.23

## 2018-04-26 LAB — LIPID PANEL
Cholesterol: 113 (ref 0–200)
HDL: 40 (ref 35–70)
LDL Cholesterol: 60
LDl/HDL Ratio: 1.5
Triglycerides: 65 (ref 40–160)

## 2018-04-26 LAB — HEMOGLOBIN A1C: Hemoglobin A1C: 6.3

## 2018-04-28 ENCOUNTER — Encounter (HOSPITAL_COMMUNITY): Payer: Self-pay

## 2018-04-30 ENCOUNTER — Encounter (HOSPITAL_COMMUNITY): Payer: Self-pay

## 2018-05-03 ENCOUNTER — Encounter (HOSPITAL_COMMUNITY): Payer: Self-pay

## 2018-05-05 ENCOUNTER — Encounter (HOSPITAL_COMMUNITY): Payer: Self-pay

## 2018-05-07 ENCOUNTER — Encounter (HOSPITAL_COMMUNITY): Payer: Self-pay

## 2018-05-10 ENCOUNTER — Encounter (HOSPITAL_COMMUNITY): Payer: Self-pay

## 2018-05-10 ENCOUNTER — Other Ambulatory Visit: Payer: Self-pay | Admitting: Cardiovascular Disease

## 2018-05-12 ENCOUNTER — Encounter (HOSPITAL_COMMUNITY): Payer: Self-pay

## 2018-05-14 ENCOUNTER — Encounter (HOSPITAL_COMMUNITY): Payer: Self-pay

## 2018-05-19 ENCOUNTER — Encounter (HOSPITAL_COMMUNITY): Payer: Self-pay

## 2018-05-21 ENCOUNTER — Encounter (HOSPITAL_COMMUNITY): Payer: Self-pay

## 2018-05-28 ENCOUNTER — Other Ambulatory Visit: Payer: Self-pay | Admitting: Cardiovascular Disease

## 2018-05-28 NOTE — Telephone Encounter (Signed)
Rx sent to pharmacy   
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
## 2018-06-21 ENCOUNTER — Ambulatory Visit (INDEPENDENT_AMBULATORY_CARE_PROVIDER_SITE_OTHER): Payer: Managed Care, Other (non HMO) | Admitting: Nurse Practitioner

## 2018-06-21 ENCOUNTER — Encounter: Payer: Self-pay | Admitting: Nurse Practitioner

## 2018-06-21 VITALS — BP 129/73 | HR 55 | Ht 74.0 in | Wt 300.6 lb

## 2018-06-21 DIAGNOSIS — Z9989 Dependence on other enabling machines and devices: Secondary | ICD-10-CM | POA: Diagnosis not present

## 2018-06-21 DIAGNOSIS — G4733 Obstructive sleep apnea (adult) (pediatric): Secondary | ICD-10-CM | POA: Diagnosis not present

## 2018-06-21 NOTE — Patient Instructions (Signed)
Compliance data at 97% which is improved  Pt has significant leak  Will order mask refit Follow-up in 3 months for repeat compliance

## 2018-08-17 ENCOUNTER — Other Ambulatory Visit: Payer: Self-pay | Admitting: Cardiovascular Disease

## 2018-08-17 NOTE — Telephone Encounter (Signed)
Rx sent to pharmacy   

## 2018-08-30 DIAGNOSIS — I2584 Coronary atherosclerosis due to calcified coronary lesion: Secondary | ICD-10-CM | POA: Diagnosis not present

## 2018-08-30 DIAGNOSIS — I119 Hypertensive heart disease without heart failure: Secondary | ICD-10-CM | POA: Diagnosis not present

## 2018-08-30 DIAGNOSIS — E039 Hypothyroidism, unspecified: Secondary | ICD-10-CM | POA: Diagnosis not present

## 2018-08-30 DIAGNOSIS — E1165 Type 2 diabetes mellitus with hyperglycemia: Secondary | ICD-10-CM | POA: Diagnosis not present

## 2018-08-30 DIAGNOSIS — Z79899 Other long term (current) drug therapy: Secondary | ICD-10-CM

## 2018-08-30 DIAGNOSIS — Z23 Encounter for immunization: Secondary | ICD-10-CM | POA: Diagnosis not present

## 2018-08-30 LAB — BASIC METABOLIC PANEL
BUN: 12 (ref 4–21)
CREATININE: 0.9 (ref 0.6–1.3)
GLUCOSE: 118
Potassium: 3.7 (ref 3.4–5.3)
SODIUM: 140 (ref 137–147)

## 2018-08-30 LAB — HEPATIC FUNCTION PANEL
ALT: 16 (ref 10–40)
AST: 17 (ref 14–40)
Alkaline Phosphatase: 103 (ref 25–125)
BILIRUBIN, TOTAL: 0.4

## 2018-08-30 LAB — HEMOGLOBIN A1C: Hemoglobin A1C: 6.3

## 2018-08-30 LAB — VITAMIN B12: VITAMIN B 12: 274

## 2018-08-30 LAB — TSH: TSH: 1.6 (ref 0.41–5.90)

## 2018-10-07 LAB — HM DIABETES EYE EXAM

## 2018-10-20 ENCOUNTER — Encounter: Payer: Self-pay | Admitting: Nurse Practitioner
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
## 2018-10-21 ENCOUNTER — Ambulatory Visit (INDEPENDENT_AMBULATORY_CARE_PROVIDER_SITE_OTHER): Payer: Managed Care, Other (non HMO) | Admitting: Nurse Practitioner

## 2018-10-21 ENCOUNTER — Encounter: Payer: Self-pay | Admitting: Nurse Practitioner

## 2018-10-21 VITALS — BP 131/80 | HR 58 | Ht 75.0 in | Wt 308.4 lb

## 2018-10-21 DIAGNOSIS — Z9989 Dependence on other enabling machines and devices: Secondary | ICD-10-CM

## 2018-10-21 DIAGNOSIS — G4733 Obstructive sleep apnea (adult) (pediatric): Secondary | ICD-10-CM

## 2018-10-21 NOTE — Patient Instructions (Signed)
Compliance data at 87%   Pt has significant leak  Needs  mask refit Follow-up in 3 months for repeat compliance

## 2018-10-29 ENCOUNTER — Other Ambulatory Visit: Payer: Self-pay | Admitting: Internal Medicine

## 2018-11-01 ENCOUNTER — Other Ambulatory Visit: Payer: Self-pay | Admitting: Internal Medicine

## 2018-12-06 ENCOUNTER — Ambulatory Visit (INDEPENDENT_AMBULATORY_CARE_PROVIDER_SITE_OTHER): Payer: Managed Care, Other (non HMO) | Admitting: Internal Medicine

## 2018-12-06 ENCOUNTER — Encounter: Payer: Self-pay | Admitting: Internal Medicine

## 2018-12-06 VITALS — BP 126/80 | HR 71 | Temp 97.9°F | Ht 75.0 in | Wt 306.8 lb

## 2018-12-06 DIAGNOSIS — I25810 Atherosclerosis of coronary artery bypass graft(s) without angina pectoris: Secondary | ICD-10-CM

## 2018-12-06 DIAGNOSIS — I119 Hypertensive heart disease without heart failure: Secondary | ICD-10-CM

## 2018-12-06 DIAGNOSIS — E1165 Type 2 diabetes mellitus with hyperglycemia: Secondary | ICD-10-CM | POA: Diagnosis not present

## 2018-12-06 DIAGNOSIS — G4733 Obstructive sleep apnea (adult) (pediatric): Secondary | ICD-10-CM

## 2018-12-06 DIAGNOSIS — Z6838 Body mass index (BMI) 38.0-38.9, adult: Secondary | ICD-10-CM

## 2018-12-06 DIAGNOSIS — E039 Hypothyroidism, unspecified: Secondary | ICD-10-CM

## 2018-12-06 DIAGNOSIS — Z9989 Dependence on other enabling machines and devices: Secondary | ICD-10-CM

## 2018-12-06 NOTE — Progress Notes (Signed)
Subjective:     Patient ID: [name] Hammond , male    DOB: 07-19-1961 , 57 y.o.   MRN: [id number]   Chief Complaint  Patient presents with  . Diabetes  . Hypertension    HPI  Diabetes  He presents for his follow-up diabetic visit. He has type 2 diabetes mellitus. His disease course has been stable. There are no hypoglycemic associated symptoms. There are no hypoglycemic complications. Risk factors for coronary artery disease include diabetes mellitus, dyslipidemia, hypertension, obesity, sedentary lifestyle and stress. He is compliant with treatment all of the time. He is following a diabetic diet.  Hypertension  This is a chronic problem. The current episode started more than 1 year ago. The problem has been gradually improving since onset. The problem is controlled.   He reports compliance with meds.   Past Medical History:  Diagnosis Date  . Anxiety   . Diabetes mellitus without complication ([place])   . Hypertension   . Hypothyroid   . Obstructive sleep apnea    CPAP  . Pure hypercholesterolemia   . PVD (peripheral vascular disease) ([place])   . Testicular hypofunction   . Vitamin D deficiency      Family History  Problem Relation Age of Onset  . Diabetes type II Mother   . Thyroid disease Mother   . Cancer - Other Father        Liver  . Hypertension Father   . Heart disease Father        Bypass surgery  . Thyroid disease Sister   . Diabetes type II Brother   . Diabetes type II Brother      Current Outpatient Medications:  .  aspirin 325 MG EC tablet, Take 1 tablet (325 mg total) by mouth daily., Disp: , Rfl:  .  levothyroxine (SYNTHROID, LEVOTHROID) 100 MCG tablet, TAKE 1 TABLET BY MOUTH EVERY DAY, Disp: 90 tablet, Rfl: 0 .  lisinopril-hydrochlorothiazide (PRINZIDE,ZESTORETIC) 20-12.5 MG tablet, Take 1 tablet by mouth daily at 12 noon. , Disp: , Rfl:  .  metoprolol tartrate (LOPRESSOR) 25 MG tablet, Take 0.5 tablets (12.5 mg total) by mouth 2 (two) times daily. Overdue  for appointment, please call office to schedule., Disp: 30 tablet, Rfl: 0 .  Saxagliptin-Metformin (KOMBIGLYZE XR) 04-999 MG TB24, Take 1 tablet by mouth daily with breakfast. , Disp: , Rfl:  .  sertraline (ZOLOFT) 50 MG tablet, Take 50 mg by mouth every evening. , Disp: , Rfl:  .  atorvastatin (LIPITOR) 80 MG tablet, TAKE 1 TABLET(80 MG) BY MOUTH AT BEDTIME, Disp: 90 tablet, Rfl: 2   Allergies  Allergen Reactions  . No Known Allergies      Review of Systems  Constitutional: Negative.   Respiratory: Negative.   Cardiovascular: Negative.   Gastrointestinal: Negative.   Genitourinary: Negative.   Neurological: Negative.   Psychiatric/Behavioral: Negative.      Today's Vitals   12/06/18 0934  BP: 126/80  Pulse: 71  Temp: 97.9 F (36.6 C)  TempSrc: Oral  Weight: (!) 306 lb 12.8 oz (139.2 kg)  Height: _0  (1.905 m)   Body mass index is 38.35 kg/m.   Objective:  Physical Exam Vitals signs and nursing note reviewed.  Constitutional:      Appearance: Normal appearance. He is obese.  HENT:     Head: Normocephalic and atraumatic.  Eyes:     Pupils: Pupils are equal, round, and reactive to light.  Neck:     Musculoskeletal: Normal range of motion.  Cardiovascular:     Rate and Rhythm: Normal rate and regular rhythm.     Heart sounds: Normal heart sounds.  Pulmonary:     Effort: Pulmonary effort is normal.     Breath sounds: Normal breath sounds.  Neurological:     General: No focal deficit present.     Mental Status: He is alert.  Psychiatric:        Mood and Affect: Mood normal.         Assessment And Plan:     1. Uncontrolled type 2 diabetes mellitus with hyperglycemia ([place])  I will check labs as listed below. He is encouraged to incorporate more exercise into his daily routine. He is advised to exercise 30 minutes five days weekly.   - Lipid Profile - Hemoglobin A1c - CMP14+EGFR  2. Benign hypertensive heart disease without heart failure  Well  controlled. He will continue with current meds. He is encouraged to avoid adding salt to his foods.   3. Coronary artery disease involving coronary bypass graft of native heart without angina pectoris  Chronic, yet stable. He is again reminded this is another reason for him to be more active.   4. Primary hypothyroidism  I will check labs to determine if his medication regimen needs to be adjusted.  - TSH  5. Obstructive sleep apnea treated with continuous positive airway pressure (CPAP)  Chronic. Importance of CPAP compliance was discussed with the patient. He is encouraged to wear nightly for at least 4 hours, ideally 6 hours per night. Unfortunately, he does not get enough sleep at night.   6. Class 2 severe obesity due to excess calories with serious comorbidity and body mass index (BMI) of 38.0 to 38.9 in adult [facility])  He is encouraged to strive for BMI less than 32 to decrease risk of repeat cardiac event. He is agreeable to [facility] for further guidance. Pt advised initial visit will likely be in the new year.   - Amb Ref to Medical Weight Management    [name], MD

## 2018-12-06 NOTE — Patient Instructions (Signed)

## 2018-12-07 LAB — CMP14+EGFR
ALT: 24 IU/L (ref 0–44)
AST: 23 IU/L (ref 0–40)
Albumin/Globulin Ratio: 1.5 (ref 1.2–2.2)
Albumin: 4.4 g/dL (ref 3.5–5.5)
Alkaline Phosphatase: 112 IU/L (ref 39–117)
BUN/Creatinine Ratio: 17 (ref 9–20)
BUN: 15 mg/dL (ref 6–24)
Bilirubin Total: 0.2 mg/dL (ref 0.0–1.2)
CALCIUM: 9.3 mg/dL (ref 8.7–10.2)
CO2: 21 mmol/L (ref 20–29)
CREATININE: 0.89 mg/dL (ref 0.76–1.27)
Chloride: 103 mmol/L (ref 96–106)
GFR calc Af Amer: 110 mL/min/{1.73_m2} (ref >59)
GFR, EST NON AFRICAN AMERICAN: 95 mL/min/{1.73_m2} (ref >59)
GLOBULIN, TOTAL: 3 g/dL (ref 1.5–4.5)
Glucose: 178 mg/dL — ABNORMAL HIGH (ref 65–99)
Potassium: 4.1 mmol/L (ref 3.5–5.2)
SODIUM: 140 mmol/L (ref 134–144)
TOTAL PROTEIN: 7.4 g/dL (ref 6.0–8.5)

## 2018-12-07 LAB — LIPID PANEL
CHOLESTEROL TOTAL: 110 mg/dL (ref 100–199)
Chol/HDL Ratio: 3 ratio (ref 0.0–5.0)
HDL: 37 mg/dL — AB (ref >39)
LDL Calculated: 57 mg/dL (ref 0–99)
TRIGLYCERIDES: 82 mg/dL (ref 0–149)
VLDL Cholesterol Cal: 16 mg/dL (ref 5–40)

## 2018-12-07 LAB — HEMOGLOBIN A1C
Est. average glucose Bld gHb Est-mCnc: 146 mg/dL
Hgb A1c MFr Bld: 6.7 % — ABNORMAL HIGH (ref 4.8–5.6)

## 2018-12-07 LAB — TSH: TSH: 1.4 u[IU]/mL (ref 0.450–4.500)

## 2018-12-07 NOTE — Progress Notes (Signed)
pls abstract last a1c, bmp, or cmp.  Your chol looks pretty good. However, your good chol is low - this lets me know that you are not getting enough physical activity.  pls increase to five days weekly for 30 minutes. Your hba1c is 6.7, this is pretty good. Your liver and kidney fxn are nl. Your thyroid fxn is nl. Continue with current thyroid supplementation

## 2018-12-09 ENCOUNTER — Telehealth: Payer: Self-pay

## 2018-12-09 ENCOUNTER — Encounter: Payer: Self-pay | Admitting: Internal Medicine

## 2018-12-09 NOTE — Telephone Encounter (Signed)
-----   Message from Dorothyann Pengobyn Sanders, MD sent at 12/07/2018 12:58 PM EST ----- pls abstract last a1c, bmp, or cmp.  Your chol looks pretty good. However, your good chol is low - this lets me know that you are not getting enough physical activity.  pls increase to five days weekly for 30 minutes. Your hba1c is 6.7, this is pretty good. Your liver and kidney fxn are nl. Your thyroid fxn is nl. Continue with current thyroid supplementation

## 2018-12-09 NOTE — Telephone Encounter (Signed)
Left the pt a message to return the call for his lab results.

## 2018-12-09 NOTE — Telephone Encounter (Signed)
Returned the pt's call and notified him of his most recent lab results.

## 2019-01-23 ENCOUNTER — Other Ambulatory Visit: Payer: Self-pay | Admitting: Internal Medicine

## 2019-02-01 ENCOUNTER — Ambulatory Visit: Payer: Managed Care, Other (non HMO) | Admitting: Nurse Practitioner

## 2019-02-04 ENCOUNTER — Other Ambulatory Visit: Payer: Self-pay | Admitting: Cardiovascular Disease

## 2019-02-04 ENCOUNTER — Other Ambulatory Visit: Payer: Self-pay | Admitting: Internal Medicine

## 2019-03-05 ENCOUNTER — Other Ambulatory Visit: Payer: Self-pay | Admitting: Cardiovascular Disease

## 2019-03-07 ENCOUNTER — Ambulatory Visit: Payer: Managed Care, Other (non HMO) | Admitting: Internal Medicine

## 2019-03-14 ENCOUNTER — Ambulatory Visit (INDEPENDENT_AMBULATORY_CARE_PROVIDER_SITE_OTHER): Payer: Managed Care, Other (non HMO) | Admitting: Internal Medicine

## 2019-03-14 ENCOUNTER — Encounter: Payer: Self-pay | Admitting: Internal Medicine

## 2019-03-14 ENCOUNTER — Other Ambulatory Visit: Payer: Self-pay

## 2019-03-14 VITALS — BP 126/84 | HR 60 | Temp 97.7°F | Ht 75.0 in | Wt 305.6 lb

## 2019-03-14 DIAGNOSIS — I25810 Atherosclerosis of coronary artery bypass graft(s) without angina pectoris: Secondary | ICD-10-CM | POA: Diagnosis not present

## 2019-03-14 DIAGNOSIS — I119 Hypertensive heart disease without heart failure: Secondary | ICD-10-CM

## 2019-03-14 DIAGNOSIS — Z23 Encounter for immunization: Secondary | ICD-10-CM

## 2019-03-14 DIAGNOSIS — E1165 Type 2 diabetes mellitus with hyperglycemia: Secondary | ICD-10-CM

## 2019-03-14 DIAGNOSIS — Z6838 Body mass index (BMI) 38.0-38.9, adult: Secondary | ICD-10-CM

## 2019-03-14 MED ORDER — PNEUMOCOCCAL 13-VAL CONJ VACC IM SUSP: 0.5000 mL | INTRAMUSCULAR | 0 refills | Status: AC

## 2019-03-14 NOTE — Patient Instructions (Signed)
Diabetes Mellitus and Exercise Exercising regularly is important for your overall health, especially when you have diabetes (diabetes mellitus). Exercising is not only about losing weight. It has many other health benefits, such as increasing muscle strength and bone density and reducing body fat and stress. This leads to improved fitness, flexibility, and endurance, all of which result in better overall health. Exercise has additional benefits for people with diabetes, including:  Reducing appetite.  Helping to lower and control blood glucose.  Lowering blood pressure.  Helping to control amounts of fatty substances (lipids) in the blood, such as cholesterol and triglycerides.  Helping the body to respond better to insulin (improving insulin sensitivity).  Reducing how much insulin the body needs.  Decreasing the risk for heart disease by: ? Lowering cholesterol and triglyceride levels. ? Increasing the levels of good cholesterol. ? Lowering blood glucose levels. What is my activity plan? Your health care provider or certified diabetes educator can help you make a plan for the type and frequency of exercise (activity plan) that works for you. Make sure that you:  Do at least 150 minutes of moderate-intensity or vigorous-intensity exercise each week. This could be brisk walking, biking, or water aerobics. ? Do stretching and strength exercises, such as yoga or weightlifting, at least 2 times a week. ? Spread out your activity over at least 3 days of the week.  Get some form of physical activity every day. ? Do not go more than 2 days in a row without some kind of physical activity. ? Avoid being inactive for more than 30 minutes at a time. Take frequent breaks to walk or stretch.  Choose a type of exercise or activity that you enjoy, and set realistic goals.  Start slowly, and gradually increase the intensity of your exercise over time. What do I need to know about managing my  diabetes?   Check your blood glucose before and after exercising. ? If your blood glucose is 240 mg/dL (13.3 mmol/L) or higher before you exercise, check your urine for ketones. If you have ketones in your urine, do not exercise until your blood glucose returns to normal. ? If your blood glucose is 100 mg/dL (5.6 mmol/L) or lower, eat a snack containing 15-20 grams of carbohydrate. Check your blood glucose 15 minutes after the snack to make sure that your level is above 100 mg/dL (5.6 mmol/L) before you start your exercise.  Know the symptoms of low blood glucose (hypoglycemia) and how to treat it. Your risk for hypoglycemia increases during and after exercise. Common symptoms of hypoglycemia can include: ? Hunger. ? Anxiety. ? Sweating and feeling clammy. ? Confusion. ? Dizziness or feeling light-headed. ? Increased heart rate or palpitations. ? Blurry vision. ? Tingling or numbness around the mouth, lips, or tongue. ? Tremors or shakes. ? Irritability.  Keep a rapid-acting carbohydrate snack available before, during, and after exercise to help prevent or treat hypoglycemia.  Avoid injecting insulin into areas of the body that are going to be exercised. For example, avoid injecting insulin into: ? The arms, when playing tennis. ? The legs, when jogging.  Keep records of your exercise habits. Doing this can help you and your health care provider adjust your diabetes management plan as needed. Write down: ? Food that you eat before and after you exercise. ? Blood glucose levels before and after you exercise. ? The type and amount of exercise you have done. ? When your insulin is expected to peak, if you use   insulin. Avoid exercising at times when your insulin is peaking.  When you start a new exercise or activity, work with your health care provider to make sure the activity is safe for you, and to adjust your insulin, medicines, or food intake as needed.  Drink plenty of water while  you exercise to prevent dehydration or heat stroke. Drink enough fluid to keep your urine clear or pale yellow. Summary  Exercising regularly is important for your overall health, especially when you have diabetes (diabetes mellitus).  Exercising has many health benefits, such as increasing muscle strength and bone density and reducing body fat and stress.  Your health care provider or certified diabetes educator can help you make a plan for the type and frequency of exercise (activity plan) that works for you.  When you start a new exercise or activity, work with your health care provider to make sure the activity is safe for you, and to adjust your insulin, medicines, or food intake as needed. This information is not intended to replace advice given to you by your health care provider. Make sure you discuss any questions you have with your health care provider. Document Released: 02/28/2004 Document Revised: 06/18/2017 Document Reviewed: 05/19/2016 Elsevier Interactive Patient Education  2019 Elsevier Inc.  

## 2019-03-14 NOTE — Progress Notes (Signed)
Subjective:     Patient ID: [name] Hammond , male    DOB: 13-Jun-1961 , 58 y.o.   MRN: [id number]   Chief Complaint  Patient presents with  . Diabetes  . Hypertension    HPI  Diabetes  He presents for his follow-up diabetic visit. He has type 2 diabetes mellitus. There are no hypoglycemic associated symptoms. Pertinent negatives for diabetes include no blurred vision and no chest pain. There are no hypoglycemic complications. Diabetic complications include heart disease. Risk factors for coronary artery disease include diabetes mellitus, dyslipidemia, male sex, hypertension, obesity and sedentary lifestyle. He participates in exercise intermittently. His breakfast blood glucose is taken between 8-9 am. His breakfast blood glucose range is generally 90-110 mg/dl. An ACE inhibitor/angiotensin II receptor blocker is being taken. He does not see a podiatrist.Eye exam is current.  Hypertension  This is a chronic problem. The current episode started more than 1 year ago. The problem has been gradually improving since onset. The problem is controlled. Pertinent negatives include no blurred vision, chest pain, palpitations or shortness of breath.   Reports compliance with meds.   Past Medical History:  Diagnosis Date  . Anxiety   . Diabetes mellitus without complication ([place])   . Hypertension   . Hypothyroid   . Obstructive sleep apnea    CPAP  . Pure hypercholesterolemia   . PVD (peripheral vascular disease) ([place])   . Testicular hypofunction   . Vitamin D deficiency      Family History  Problem Relation Age of Onset  . Diabetes type II Mother   . Thyroid disease Mother   . Cancer - Other Father        Liver  . Hypertension Father   . Heart disease Father        Bypass surgery  . Thyroid disease Sister   . Diabetes type II Brother   . Diabetes type II Brother      Current Outpatient Medications:  .  aspirin 325 MG EC tablet, Take 1 tablet (325 mg total) by mouth daily., Disp: ,  Rfl:  .  atorvastatin (LIPITOR) 80 MG tablet, TAKE 1 TABLET(80 MG) BY MOUTH AT BEDTIME, Disp: 30 tablet, Rfl: 0 .  levothyroxine (SYNTHROID, LEVOTHROID) 100 MCG tablet, TAKE 1 TABLET BY MOUTH EVERY DAY, Disp: 90 tablet, Rfl: 0 .  lisinopril-hydrochlorothiazide (PRINZIDE,ZESTORETIC) 20-12.5 MG tablet, TAKE 1 TABLET BY MOUTH DAILY, Disp: 90 tablet, Rfl: 1 .  metoprolol tartrate (LOPRESSOR) 25 MG tablet, Take 0.5 tablets (12.5 mg total) by mouth 2 (two) times daily. Overdue for appointment, please call office to schedule., Disp: 30 tablet, Rfl: 0 .  Saxagliptin-Metformin (KOMBIGLYZE XR) 04-999 MG TB24, Take 1 tablet by mouth daily with breakfast. , Disp: , Rfl:  .  sertraline (ZOLOFT) 50 MG tablet, TAKE 1 TABLET BY MOUTH EVERY DAY, Disp: 30 tablet, Rfl: 4   Allergies  Allergen Reactions  . No Known Allergies      Review of Systems  Constitutional: Negative.   Eyes: Negative for blurred vision.  Respiratory: Negative.  Negative for shortness of breath.   Cardiovascular: Negative.  Negative for chest pain and palpitations.  Gastrointestinal: Negative.   Neurological: Negative.   Psychiatric/Behavioral: Negative.      Today's Vitals   03/14/19 1205  BP: 126/84  Pulse: 60  Temp: 97.7 F (36.5 C)  TempSrc: Oral  Weight: (!) 305 lb 9.6 oz (138.6 kg)  Height: 6' 3"  (1.905 m)   Body mass index is 38.2 kg/m.  Objective:  Physical Exam Vitals signs and nursing note reviewed.  Constitutional:      Appearance: Normal appearance.  Cardiovascular:     Rate and Rhythm: Normal rate and regular rhythm.     Heart sounds: Normal heart sounds.  Pulmonary:     Effort: Pulmonary effort is normal.     Breath sounds: Normal breath sounds.  Skin:    General: Skin is warm.  Neurological:     General: No focal deficit present.     Mental Status: He is alert.  Psychiatric:        Mood and Affect: Mood normal.         Assessment And Plan:     1. Uncontrolled type 2 diabetes mellitus with  hyperglycemia ([place])  I will check labs as listed below. He is encouraged to aim to lose ten pounds prior to his visit in June 2020.   - BMP8+EGFR - Hemoglobin A1c  2. Benign hypertensive heart disease without heart failure  Well controlled. He will continue with current meds. He is encouraged to avoid adding salt to his foods.   3. Coronary artery disease involving coronary bypass graft of native heart without angina pectoris  Chronic, yet stable.  He is encouraged to incorporate more exercise into his daily routine. He is encouraged to aim for 150 minutes per week.   4. Class 2 severe obesity due to excess calories with serious comorbidity and body mass index (BMI) of 38.0 to 38.9 in adult [facility])  Importance of achieving optimal weight to decrease risk of cardiovascular disease and cancers was discussed with the patient in full detail. He is encouraged to start slowly - start with 10 minutes twice daily at least three to four days per week and to gradually build to 30 minutes five days weekly. He was given tips to incorporate more activity into her daily routine - take stairs when possible, park farther away from his job, grocery stores, etc.   5.  Need for vaccination.   Rx for Prevnar -13 was sent to the pharmacy.   [name], MD

## 2019-03-15 ENCOUNTER — Ambulatory Visit: Payer: Managed Care, Other (non HMO) | Admitting: Nurse Practitioner

## 2019-03-15 LAB — HEMOGLOBIN A1C
Est. average glucose Bld gHb Est-mCnc: 154 mg/dL
Hgb A1c MFr Bld: 7 % — ABNORMAL HIGH (ref 4.8–5.6)

## 2019-03-15 LAB — BMP8+EGFR
BUN / CREAT RATIO: 13 (ref 9–20)
BUN: 12 mg/dL (ref 6–24)
CO2: 23 mmol/L (ref 20–29)
Calcium: 9.1 mg/dL (ref 8.7–10.2)
Chloride: 97 mmol/L (ref 96–106)
Creatinine, Ser: 0.91 mg/dL (ref 0.76–1.27)
GFR calc Af Amer: 107 mL/min/{1.73_m2} (ref >59)
GFR calc non Af Amer: 93 mL/min/{1.73_m2} (ref >59)
Glucose: 116 mg/dL — ABNORMAL HIGH (ref 65–99)
Potassium: 4.9 mmol/L (ref 3.5–5.2)
Sodium: 138 mmol/L (ref 134–144)

## 2019-03-29 ENCOUNTER — Other Ambulatory Visit: Payer: Self-pay | Admitting: Internal Medicine

## 2019-04-01 ENCOUNTER — Other Ambulatory Visit: Payer: Self-pay | Admitting: Internal Medicine

## 2019-04-03 ENCOUNTER — Other Ambulatory Visit: Payer: Self-pay | Admitting: Internal Medicine

## 2019-04-03 ENCOUNTER — Other Ambulatory Visit: Payer: Self-pay | Admitting: Cardiovascular Disease

## 2019-04-18 ENCOUNTER — Telehealth: Payer: Self-pay | Admitting: *Deleted

## 2019-04-18 NOTE — Telephone Encounter (Signed)
LMVM for pt that was calling re: his cpap compliance.  To call us back.

## 2019-05-01 ENCOUNTER — Other Ambulatory Visit: Payer: Self-pay | Admitting: Cardiovascular Disease

## 2019-05-02 ENCOUNTER — Other Ambulatory Visit: Payer: Self-pay | Admitting: Cardiovascular Disease

## 2019-05-18 ENCOUNTER — Telehealth: Payer: Self-pay

## 2019-05-18 NOTE — Telephone Encounter (Signed)
The pt called and confirmed that he is keeping his next appointment for next Monday.

## 2019-05-23 ENCOUNTER — Encounter: Payer: Self-pay | Admitting: Internal Medicine

## 2019-05-23 ENCOUNTER — Other Ambulatory Visit: Payer: Self-pay

## 2019-05-23 ENCOUNTER — Encounter: Payer: Managed Care, Other (non HMO) | Admitting: Internal Medicine

## 2019-05-23 ENCOUNTER — Ambulatory Visit (INDEPENDENT_AMBULATORY_CARE_PROVIDER_SITE_OTHER): Payer: Managed Care, Other (non HMO) | Admitting: Internal Medicine

## 2019-05-23 VITALS — BP 144/80 | HR 81 | Temp 97.7°F | Ht 73.6 in | Wt 301.6 lb

## 2019-05-23 DIAGNOSIS — E1165 Type 2 diabetes mellitus with hyperglycemia: Secondary | ICD-10-CM | POA: Diagnosis not present

## 2019-05-23 DIAGNOSIS — Z0000 Encounter for general adult medical examination without abnormal findings: Secondary | ICD-10-CM | POA: Diagnosis not present

## 2019-05-23 DIAGNOSIS — I25810 Atherosclerosis of coronary artery bypass graft(s) without angina pectoris: Secondary | ICD-10-CM

## 2019-05-23 DIAGNOSIS — I119 Hypertensive heart disease without heart failure: Secondary | ICD-10-CM | POA: Diagnosis not present

## 2019-05-23 LAB — POCT URINALYSIS DIPSTICK
Bilirubin, UA: NEGATIVE
Blood, UA: NEGATIVE
Glucose, UA: NEGATIVE
Ketones, UA: NEGATIVE
Leukocytes, UA: NEGATIVE
Nitrite, UA: NEGATIVE
Protein, UA: POSITIVE — AB
Spec Grav, UA: >=1.03 — AB (ref 1.010–1.025)
Urobilinogen, UA: 0.2 E.U./dL
pH, UA: 5.5 (ref 5.0–8.0)

## 2019-05-23 LAB — POCT UA - MICROALBUMIN
Creatinine, POC: 300 mg/dL
Microalbumin Ur, POC: 80 mg/L

## 2019-05-23 NOTE — Progress Notes (Addendum)
Subjective:     Patient ID: [name] Hammond , male    DOB: [birth year] , 58 y.o.   MRN: [id number]   Chief Complaint  Patient presents with  . Annual Exam  . Diabetes  . Hypertension    HPI  He is here today for a full physical examination.  He is followed by Urology for his prostate exams. He reports he is scheduled to be seen in July 2020. He has no specific concerns or complaints at this time.   Diabetes  He presents for his follow-up diabetic visit. He has type 2 diabetes mellitus. There are no hypoglycemic associated symptoms. Pertinent negatives for diabetes include no blurred vision and no chest pain. There are no hypoglycemic complications. Diabetic complications include heart disease. Risk factors for coronary artery disease include diabetes mellitus, dyslipidemia, male sex, hypertension, obesity and sedentary lifestyle. He is following a diabetic diet. He has had a previous visit with a dietitian. He participates in exercise intermittently. His breakfast blood glucose is taken between 8-9 am. His breakfast blood glucose range is generally 90-110 mg/dl. An ACE inhibitor/angiotensin II receptor blocker is being taken. He does not see a podiatrist.Eye exam is current.  Hypertension  This is a chronic problem. The current episode started more than 1 year ago. The problem has been gradually improving since onset. The problem is controlled. Pertinent negatives include no blurred vision, chest pain, palpitations or shortness of breath. Risk factors for coronary artery disease include diabetes mellitus, dyslipidemia, obesity and sedentary lifestyle. The current treatment provides moderate improvement. Compliance problems include exercise.  Hypertensive end-organ damage includes CAD/MI.     Past Medical History:  Diagnosis Date  . Anxiety   . Diabetes mellitus without complication ([facility])   . Hypertension   . Hypothyroid   . Obstructive sleep apnea    CPAP  . Pure hypercholesterolemia   .  PVD (peripheral vascular disease) ([facility])   . Testicular hypofunction   . Vitamin D deficiency      Family History  Problem Relation Age of Onset  . Diabetes type II Mother   . Thyroid disease Mother   . Cancer - Other Father        Liver  . Hypertension Father   . Heart disease Father        Bypass surgery  . Thyroid disease Sister   . Diabetes type II Brother   . Diabetes type II Brother      Current Outpatient Medications:  .  aspirin 325 MG EC tablet, Take 1 tablet (325 mg total) by mouth daily., Disp: , Rfl:  .  atorvastatin (LIPITOR) 80 MG tablet, TAKE 1 TABLET(80 MG) BY MOUTH AT BEDTIME, Disp: 90 tablet, Rfl: 1 .  KOMBIGLYZE XR 04-999 MG TB24, TAKE 1 TABLET BY MOUTH EVERY DAY WITH THE EVENING MEAL, Disp: 90 tablet, Rfl: 1 .  levothyroxine (SYNTHROID, LEVOTHROID) 100 MCG tablet, TAKE 1 TABLET BY MOUTH EVERY DAY, Disp: 90 tablet, Rfl: 0 .  lisinopril-hydrochlorothiazide (PRINZIDE,ZESTORETIC) 20-12.5 MG tablet, TAKE 1 TABLET BY MOUTH DAILY, Disp: 90 tablet, Rfl: 1 .  metoprolol tartrate (LOPRESSOR) 25 MG tablet, TAKE 1/2 TABLET BY MOUTH TWICE DAILY, Disp: 90 tablet, Rfl: 1 .  sertraline (ZOLOFT) 50 MG tablet, TAKE 1 TABLET BY MOUTH EVERY DAY, Disp: 30 tablet, Rfl: 4   Allergies  Allergen Reactions  . No Known Allergies     Men's preventive visit. Patient Health Questionnaire (PHQ-2) is    Office Visit from 05/23/2019 in [facility]  PHQ-2 Total Score  0    . Patient is on a "heart-healthy" diet. Marital status: Married. Relevant history for alcohol use is:  Social History   Substance and Sexual Activity  Alcohol Use No  . Relevant history for tobacco use is:  Social History   Tobacco Use  Smoking Status Never Smoker  Smokeless Tobacco Never Used  .  Review of Systems  Constitutional: Negative.   HENT: Negative.   Eyes: Negative.  Negative for blurred vision.  Respiratory: Negative.  Negative for shortness of breath.   Cardiovascular:  Negative.  Negative for chest pain and palpitations.  Gastrointestinal: Negative.   Endocrine: Negative.   Genitourinary: Negative.   Musculoskeletal: Negative.   Skin: Negative.   Allergic/Immunologic: Negative.   Neurological: Negative.   Hematological: Negative.   Psychiatric/Behavioral: Negative.      Today's Vitals   05/23/19 1052  BP: (!) 144/80  Pulse: 81  Temp: 97.7 F (36.5 C)  TempSrc: Oral  Weight: (!) 301 lb 9.6 oz (136.8 kg)  Height: 6' 1.6" (1.869 m)   Body mass index is 39.15 kg/m.   Objective:  Physical Exam Vitals signs and nursing note reviewed.  Constitutional:      Appearance: Normal appearance.  HENT:     Head: Normocephalic and atraumatic.     Right Ear: Tympanic membrane, ear canal and external ear normal.     Left Ear: Tympanic membrane, ear canal and external ear normal.     Nose: Nose normal.     Mouth/Throat:     Mouth: Mucous membranes are moist.     Pharynx: Oropharynx is clear.  Eyes:     Extraocular Movements: Extraocular movements intact.     Conjunctiva/sclera: Conjunctivae normal.     Pupils: Pupils are equal, round, and reactive to light.  Neck:     Musculoskeletal: Normal range of motion and neck supple.  Cardiovascular:     Rate and Rhythm: Normal rate and regular rhythm.     Pulses: Normal pulses.          Dorsalis pedis pulses are 2+ on the right side and 2+ on the left side.       Posterior tibial pulses are 2+ on the right side and 2+ on the left side.     Heart sounds: Normal heart sounds.  Pulmonary:     Effort: Pulmonary effort is normal.     Breath sounds: Normal breath sounds.  Chest:     Breasts:        Right: Normal. No swelling, bleeding, inverted nipple, mass or nipple discharge.        Left: Normal. No swelling, bleeding, inverted nipple, mass or nipple discharge.     Comments: Healed surgical scars b/l breasts, healed surgical sternal scar Abdominal:     General: Bowel sounds are normal.     Palpations:  Abdomen is soft.     Comments: Obese, difficult to assess organomegaly  Genitourinary:    Comments: deferred Musculoskeletal: Normal range of motion.  Feet:     Right foot:     Protective Sensation: 5 sites tested. 5 sites sensed.     Skin integrity: Skin integrity normal.     Toenail Condition: Right toenails are normal.     Left foot:     Protective Sensation: 5 sites tested. 5 sites sensed.     Skin integrity: Skin integrity normal.     Toenail Condition: Left toenails are normal.  Skin:  General: Skin is warm.  Neurological:     General: No focal deficit present.     Mental Status: He is alert.  Psychiatric:        Mood and Affect: Mood normal.        Behavior: Behavior normal.         Assessment And Plan:     1. Routine general medical examination at health care facility  A full exam was performed. DRE not performed, per his request. PATIENT HAS BEEN ADVISED TO GET 30-45 MINUTES REGULAR EXERCISE NO LESS THAN FOUR TO FIVE DAYS PER WEEK - BOTH WEIGHTBEARING EXERCISES AND AEROBIC ARE RECOMMENDED.  HE IS ADVISED TO FOLLOW A HEALTHY DIET WITH AT LEAST SIX FRUITS/VEGGIES PER DAY, DECREASE INTAKE OF RED MEAT, AND TO INCREASE FISH INTAKE TO TWO DAYS PER WEEK.  MEATS/FISH SHOULD NOT BE FRIED, BAKED OR BROILED IS PREFERABLE.  I SUGGEST WEARING SPF 50 SUNSCREEN ON EXPOSED PARTS AND ESPECIALLY WHEN IN THE DIRECT SUNLIGHT FOR AN EXTENDED PERIOD OF TIME.  PLEASE AVOID FAST FOOD RESTAURANTS AND INCREASE YOUR WATER INTAKE.   2. Uncontrolled type 2 diabetes mellitus with hyperglycemia ([facility])  Diabetic foot exam was performed. I DISCUSSED WITH THE PATIENT AT LENGTH REGARDING THE GOALS OF GLYCEMIC CONTROL AND POSSIBLE LONG-TERM COMPLICATIONS.  I  ALSO STRESSED THE IMPORTANCE OF COMPLIANCE WITH HOME GLUCOSE MONITORING, DIETARY RESTRICTIONS INCLUDING AVOIDANCE OF SUGARY DRINKS/PROCESSED FOODS,  ALONG WITH REGULAR EXERCISE.  I  ALSO STRESSED THE IMPORTANCE OF ANNUAL EYE EXAMS, SELF FOOT CARE AND  COMPLIANCE WITH OFFICE VISITS.   3. Benign hypertensive heart disease without heart failure  Fair control. He has yet to take his meds today. He is encouraged to avoid adding salt to his foods. EKG performed, no acute changes noted. He is encouraged to walk 30 minutes five days weekly. He is reminded that he can exercise even if the gym is closed.   4. Coronary artery disease involving coronary bypass graft of native heart without angina pectoris  Chronic, yet stable. Importance of dietary, medication and exercise compliance was discussed with the patient in full detail.   Gwynneth Aliment, MD    THE PATIENT IS ENCOURAGED TO PRACTICE SOCIAL DISTANCING DUE TO THE COVID-19 PANDEMIC.

## 2019-05-23 NOTE — Patient Instructions (Signed)

## 2019-05-24 LAB — CBC
Hematocrit: 45.7 % (ref 37.5–51.0)
Hemoglobin: 15.4 g/dL (ref 13.0–17.7)
MCH: 29.7 pg (ref 26.6–33.0)
MCHC: 33.7 g/dL (ref 31.5–35.7)
MCV: 88 fL (ref 79–97)
Platelets: 307 10*3/uL (ref 150–450)
RBC: 5.18 x10E6/uL (ref 4.14–5.80)
RDW: 14.6 % (ref 11.6–15.4)
WBC: 9.5 10*3/uL (ref 3.4–10.8)

## 2019-05-24 LAB — LIPID PANEL
Chol/HDL Ratio: 3.1 ratio (ref 0.0–5.0)
Cholesterol, Total: 114 mg/dL (ref 100–199)
HDL: 37 mg/dL — ABNORMAL LOW (ref >39)
LDL Calculated: 53 mg/dL (ref 0–99)
Triglycerides: 120 mg/dL (ref 0–149)
VLDL Cholesterol Cal: 24 mg/dL (ref 5–40)

## 2019-05-24 LAB — HEMOGLOBIN A1C
Est. average glucose Bld gHb Est-mCnc: 148 mg/dL
Hgb A1c MFr Bld: 6.8 % — ABNORMAL HIGH (ref 4.8–5.6)

## 2019-05-24 LAB — CMP14+EGFR
ALT: 29 IU/L (ref 0–44)
AST: 25 IU/L (ref 0–40)
Albumin/Globulin Ratio: 1.4 (ref 1.2–2.2)
Albumin: 4.6 g/dL (ref 3.8–4.9)
Alkaline Phosphatase: 105 IU/L (ref 39–117)
BUN/Creatinine Ratio: 19 (ref 9–20)
BUN: 17 mg/dL (ref 6–24)
Bilirubin Total: 0.4 mg/dL (ref 0.0–1.2)
CO2: 24 mmol/L (ref 20–29)
Calcium: 9.5 mg/dL (ref 8.7–10.2)
Chloride: 99 mmol/L (ref 96–106)
Creatinine, Ser: 0.91 mg/dL (ref 0.76–1.27)
GFR calc Af Amer: 107 mL/min/{1.73_m2} (ref >59)
GFR calc non Af Amer: 93 mL/min/{1.73_m2} (ref >59)
Globulin, Total: 3.3 g/dL (ref 1.5–4.5)
Glucose: 120 mg/dL — ABNORMAL HIGH (ref 65–99)
Potassium: 4 mmol/L (ref 3.5–5.2)
Sodium: 141 mmol/L (ref 134–144)
Total Protein: 7.9 g/dL (ref 6.0–8.5)

## 2019-05-24 LAB — T4, FREE: Free T4: 1.47 ng/dL (ref 0.82–1.77)

## 2019-05-24 LAB — TSH: TSH: 1.38 u[IU]/mL (ref 0.450–4.500)

## 2019-06-30 ENCOUNTER — Other Ambulatory Visit: Payer: Self-pay | Admitting: Internal Medicine

## 2019-07-07 ENCOUNTER — Other Ambulatory Visit: Payer: Self-pay | Admitting: Internal Medicine

## 2019-07-23 ENCOUNTER — Other Ambulatory Visit: Payer: Self-pay | Admitting: Internal Medicine

## 2019-08-23 ENCOUNTER — Ambulatory Visit: Payer: Managed Care, Other (non HMO) | Admitting: Internal Medicine

## 2019-09-26 ENCOUNTER — Other Ambulatory Visit: Payer: Self-pay | Admitting: Internal Medicine

## 2019-09-26 MED ORDER — METOPROLOL TARTRATE 25 MG PO TABS: 12.5000 mg | ORAL_TABLET | Freq: Two times a day (BID) | ORAL | 0 refills | Status: DC

## 2019-09-27 ENCOUNTER — Telehealth: Payer: Self-pay

## 2019-09-27 NOTE — Telephone Encounter (Signed)
LVM for pt to call the office to schedule an appt for Oct

## 2019-10-04 ENCOUNTER — Ambulatory Visit (INDEPENDENT_AMBULATORY_CARE_PROVIDER_SITE_OTHER): Payer: Managed Care, Other (non HMO) | Admitting: Internal Medicine

## 2019-10-04 ENCOUNTER — Ambulatory Visit: Payer: Managed Care, Other (non HMO) | Admitting: Nurse Practitioner

## 2019-10-04 ENCOUNTER — Encounter: Payer: Self-pay | Admitting: Internal Medicine

## 2019-10-04 ENCOUNTER — Other Ambulatory Visit: Payer: Self-pay

## 2019-10-04 VITALS — BP 144/80 | HR 62 | Temp 97.8°F | Ht 73.2 in | Wt 301.0 lb

## 2019-10-04 DIAGNOSIS — I119 Hypertensive heart disease without heart failure: Secondary | ICD-10-CM | POA: Diagnosis not present

## 2019-10-04 DIAGNOSIS — Z23 Encounter for immunization: Secondary | ICD-10-CM | POA: Diagnosis not present

## 2019-10-04 DIAGNOSIS — R0981 Nasal congestion: Secondary | ICD-10-CM | POA: Diagnosis not present

## 2019-10-04 DIAGNOSIS — Z6839 Body mass index (BMI) 39.0-39.9, adult: Secondary | ICD-10-CM

## 2019-10-04 DIAGNOSIS — Z1211 Encounter for screening for malignant neoplasm of colon: Secondary | ICD-10-CM

## 2019-10-04 DIAGNOSIS — E1165 Type 2 diabetes mellitus with hyperglycemia: Secondary | ICD-10-CM | POA: Diagnosis not present

## 2019-10-04 LAB — POCT URINALYSIS DIPSTICK
Bilirubin, UA: NEGATIVE
Blood, UA: NEGATIVE
Glucose, UA: NEGATIVE
Leukocytes, UA: NEGATIVE
Nitrite, UA: NEGATIVE
Protein, UA: POSITIVE — AB
Spec Grav, UA: 1.02 (ref 1.010–1.025)
Urobilinogen, UA: 1 E.U./dL
pH, UA: 6.5 (ref 5.0–8.0)

## 2019-10-04 LAB — POCT UA - MICROALBUMIN
Creatinine, POC: 300 mg/dL
Microalbumin Ur, POC: 150 mg/L

## 2019-10-04 MED ORDER — SERTRALINE HCL 50 MG PO TABS: 50.0000 mg | ORAL_TABLET | Freq: Every day | ORAL | 2 refills | Status: DC

## 2019-10-04 MED ORDER — KOMBIGLYZE XR 5-1000 MG PO TB24: ORAL_TABLET | ORAL | 1 refills | Status: DC

## 2019-10-04 MED ORDER — METOPROLOL TARTRATE 25 MG PO TABS: 12.5000 mg | ORAL_TABLET | Freq: Two times a day (BID) | ORAL | 1 refills | Status: DC

## 2019-10-04 MED ORDER — ATORVASTATIN CALCIUM 80 MG PO TABS: ORAL_TABLET | ORAL | 1 refills | Status: DC

## 2019-10-04 MED ORDER — FLUTICASONE PROPIONATE 50 MCG/ACT NA SUSP: 1.0000 | Freq: Every day | NASAL | 2 refills | Status: DC

## 2019-10-04 MED ORDER — LISINOPRIL-HYDROCHLOROTHIAZIDE 20-12.5 MG PO TABS: 1.0000 | ORAL_TABLET | Freq: Every day | ORAL | 1 refills | Status: DC

## 2019-10-04 NOTE — Patient Instructions (Signed)
Preventing Influenza, Adult Influenza, more commonly known as "the flu," is a viral infection that mainly affects the respiratory tract. The respiratory tract includes structures that help you breathe, such as the lungs, nose, and throat. The flu causes many common cold symptoms, as well as a high fever and body aches. The flu spreads easily from person to person (is contagious). The flu is most common from December through March. This is called flu season.You can catch the flu virus by:  Breathing in droplets from an infected person's cough or sneeze.  Touching something that was recently contaminated with the virus and then touching your mouth, nose, or eyes. What can I do to lower my risk?        You can decrease your risk of getting the flu by:  Getting a flu shot (influenza vaccination) every year. This is the best way to prevent the flu. A flu shot is recommended for everyone age 6 months and older. ? It is best to get a flu shot in the fall, as soon as it is available. Getting a flu shot during winter or spring instead is still a good idea. Flu season can last into early spring. ? Preventing the flu through vaccination requires getting a new flu shot every year. This is because the flu virus changes slightly (mutates) from one year to the next. Even if a flu shot does not completely protect you from all flu virus mutations, it can reduce the severity of your illness and prevent dangerous complications of the flu. ? If you are pregnant, you can and should get a flu shot. ? If you have had a reaction to the shot in the past or if you are allergic to eggs, check with your health care provider before getting a flu shot. ? Sometimes the vaccine is available as a nasal spray. In some years, the nasal spray has not been as effective against the flu virus. Check with your health care provider if you have questions about this.  Practicing good health habits. This is especially important during  flu season. ? Avoid contact with people who are sick with flu or cold symptoms. ? Wash your hands with soap and water often. If soap and water are not available, use alcohol-based hand sanitizer. ? Avoid touching your hands to your face, especially when you have not washed your hands recently. ? Use a disinfectant to clean surfaces at home and at work that may be contaminated with the flu virus. ? Keep your body's disease-fighting system (immune system) in good shape by eating a healthy diet, drinking plenty of fluids, getting enough sleep, and exercising regularly. If you do get the flu, avoid spreading it to others by:  Staying home until your symptoms have been gone for at least one day.  Covering your mouth and nose when you cough or sneeze.  Avoiding close contact with others, especially babies and elderly people. Why are these changes important? Getting a flu shot and practicing good health habits protects you as well as other people. If you get the flu, your friends, family, and co-workers are also at risk of getting it, because it spreads so easily to others. Each year, about 2 out of every 10 people get the flu. Having the flu can lead to complications, such as pneumonia, ear infection, and sinus infection. The flu also can be deadly, especially for babies, people older than age 65, and people who have serious long-term diseases. How is this treated? Most   people recover from the flu by resting at home and drinking plenty of fluids. However, a prescription antiviral medicine may reduce your flu symptoms and may make your flu go away sooner. This medicine must be started within a few days of getting flu symptoms. You can talk with your health care provider about whether you need an antiviral medicine. Antiviral medicine may be prescribed for people who are at risk for more serious flu symptoms. This includes people who:  Are older than age 65.  Are pregnant.  Have a condition that  makes the flu worse or more dangerous. Where to find more information  Centers for Disease Control and Prevention: www.cdc.gov/flu/index.htm  Flu.gov: www.flu.gov/prevention-vaccination  American Academy of [employer]icians: familydoctor.org/familydoctor/en/kids/vaccines/preventing-the-flu.html Contact a health care provider if:  You have influenza and you develop new symptoms.  You have: ? Chest pain. ? Diarrhea. ? A fever.  Your cough gets worse, or you produce more mucus. Summary  The best way to prevent the flu is to get a flu shot every year in the fall.  Even if you get the flu after you have received the yearly vaccine, your flu may be milder and go away sooner because of your flu shot.  If you get the flu, antiviral medicines that are started with a few days of symptoms may reduce your flu symptoms and may make your flu go away sooner.  You can also help prevent the flu by practicing good health habits. This information is not intended to replace advice given to you by your health care provider. Make sure you discuss any questions you have with your health care provider. Document Released: 12/23/2015 Document Revised: 11/20/2017 Document Reviewed: 08/16/2016 Elsevier Patient Education  2020 Elsevier Inc.  

## 2019-10-05 LAB — BMP8+EGFR
BUN/Creatinine Ratio: 14 (ref 9–20)
BUN: 12 mg/dL (ref 6–24)
CO2: 25 mmol/L (ref 20–29)
Calcium: 9.5 mg/dL (ref 8.7–10.2)
Chloride: 103 mmol/L (ref 96–106)
Creatinine, Ser: 0.88 mg/dL (ref 0.76–1.27)
GFR calc Af Amer: 109 mL/min/{1.73_m2} (ref >59)
GFR calc non Af Amer: 95 mL/min/{1.73_m2} (ref >59)
Glucose: 114 mg/dL — ABNORMAL HIGH (ref 65–99)
Potassium: 4.2 mmol/L (ref 3.5–5.2)
Sodium: 142 mmol/L (ref 134–144)

## 2019-10-05 LAB — HEMOGLOBIN A1C
Est. average glucose Bld gHb Est-mCnc: 143 mg/dL
Hgb A1c MFr Bld: 6.6 % — ABNORMAL HIGH (ref 4.8–5.6)

## 2019-10-08 NOTE — Progress Notes (Signed)
Subjective:     Patient ID: [name] Hammond , male    DOB: [birth year] , 58 y.o.   MRN: [id number]   Chief Complaint  Patient presents with  . Diabetes  . Hypertension    HPI  Diabetes He presents for his follow-up diabetic visit. He has type 2 diabetes mellitus. There are no hypoglycemic associated symptoms. Pertinent negatives for diabetes include no blurred vision and no chest pain. There are no hypoglycemic complications. Diabetic complications include heart disease. Risk factors for coronary artery disease include diabetes mellitus, dyslipidemia, male sex, hypertension, obesity and sedentary lifestyle. He participates in exercise intermittently. His breakfast blood glucose is taken between 8-9 am. His breakfast blood glucose range is generally 90-110 mg/dl. An ACE inhibitor/angiotensin II receptor blocker is being taken. He does not see a podiatrist.Eye exam is current.  Hypertension This is a chronic problem. The current episode started more than 1 year ago. The problem has been gradually improving since onset. The problem is controlled. Pertinent negatives include no blurred vision, chest pain, palpitations or shortness of breath. Risk factors for coronary artery disease include diabetes mellitus, dyslipidemia, obesity, sedentary lifestyle and male gender. The current treatment provides moderate improvement. Compliance problems include exercise.  Hypertensive end-organ damage includes CAD/MI.     Past Medical History:  Diagnosis Date  . Anxiety   . Diabetes mellitus without complication ([place])   . Hypertension   . Hypothyroid   . Obstructive sleep apnea    CPAP  . Pure hypercholesterolemia   . PVD (peripheral vascular disease) ([place])   . Testicular hypofunction   . Vitamin D deficiency      Family History  Problem Relation Age of Onset  . Diabetes type II Mother   . Thyroid disease Mother   . Cancer - Other Father        Liver  . Hypertension Father   . Heart disease Father         Bypass surgery  . Thyroid disease Sister   . Diabetes type II Brother   . Diabetes type II Brother      Current Outpatient Medications:  .  aspirin 325 MG EC tablet, Take 1 tablet (325 mg total) by mouth daily., Disp: , Rfl:  .  atorvastatin (LIPITOR) 80 MG tablet, TAKE 1 TABLET(80 MG) BY MOUTH AT BEDTIME, Disp: 90 tablet, Rfl: 1 .  levothyroxine (SYNTHROID) 100 MCG tablet, TAKE 1 TABLET BY MOUTH EVERY DAY, Disp: 90 tablet, Rfl: 0 .  lisinopril-hydrochlorothiazide (ZESTORETIC) 20-12.5 MG tablet, Take 1 tablet by mouth daily., Disp: 90 tablet, Rfl: 1 .  metoprolol tartrate (LOPRESSOR) 25 MG tablet, Take 0.5 tablets (12.5 mg total) by mouth 2 (two) times daily., Disp: 90 tablet, Rfl: 1 .  Saxagliptin-Metformin (KOMBIGLYZE XR) 04-999 MG TB24, TAKE 1 TABLET BY MOUTH EVERY DAY WITH THE EVENING MEAL, Disp: 90 tablet, Rfl: 1 .  sertraline (ZOLOFT) 50 MG tablet, Take 1 tablet (50 mg total) by mouth daily., Disp: 90 tablet, Rfl: 2 .  fluticasone (FLONASE) 50 MCG/ACT nasal spray, Place 1 spray into both nostrils daily., Disp: 16 g, Rfl: 2   Allergies  Allergen Reactions  . No Known Allergies      Review of Systems  Constitutional: Negative.   Eyes: Negative for blurred vision.  Respiratory: Negative.  Negative for shortness of breath.   Cardiovascular: Negative.  Negative for chest pain and palpitations.  Gastrointestinal: Negative.   Neurological: Negative.   Psychiatric/Behavioral: Negative.      Today's Vitals  10/04/19 1619  BP: (!) 144/80  Pulse: 62  Temp: 97.8 F (36.6 C)  TempSrc: Oral  SpO2: 96%  Weight: (!) 301 lb (136.5 kg)  Height: 6' 1.2" (1.859 m)   Body mass index is 39.5 kg/m.   Objective:  Physical Exam Vitals signs and nursing note reviewed.  Constitutional:      Appearance: Normal appearance.  Cardiovascular:     Rate and Rhythm: Normal rate and regular rhythm.     Heart sounds: Normal heart sounds.  Pulmonary:     Effort: Pulmonary effort is  normal.     Breath sounds: Normal breath sounds.  Skin:    General: Skin is warm.  Neurological:     General: No focal deficit present.     Mental Status: He is alert.  Psychiatric:        Mood and Affect: Mood normal.         Assessment And Plan:     1. Uncontrolled type 2 diabetes mellitus with hyperglycemia ([place])  I will check labs as listed below.  Importance of regular exercise was discussed with the patient. He is encouraged to walk 15 minutes away from his home, and then walk back - most days of the week. He is anxious about returning to the gym, which I of course understand. He is reminded he can walk in his neighborhood for free!   - Hemoglobin A1c - BMP8+EGFR - POCT Urinalysis Dipstick (81002) - POCT UA - Microalbumin  2. Benign hypertensive heart disease without heart failure  Chronic, fair control. He will continue with current meds for now. He is encouraged to limit his salt intake and to incorporate more exercise into his daily routine.   - POCT Urinalysis Dipstick (81002) - POCT UA - Microalbumin  3. Sinus congestion  He was given samples of Zyrtec, 41m to take nightly as needed. Possible side effects including drowsiness were reviewed with the patient.   4. Screen for colon cancer  I will refer him to GI. He was scheduled earlier this year, but appt was cancelled due to [place].   - Ambulatory referral to Gastroenterology  5. Need for influenza vaccination  - Flu Vaccine QUAD 6+ mos PF IM (Fluarix Quad PF)    6. Class 2 severe obesity due to excess calories with serious comorbidity and body mass index (BMI) of 39.0 to 39.9 in adult ([facility]  Importance of achieving optimal weight to decrease risk of cardiovascular disease and cancers was discussed with the patient in full detail. He is encouraged to start slowly - start with 10 minutes once daily at least three to four days per week and to gradually build to 30 minutes five days weekly. He was  given tips to incorporate more activity into her daily routine - take stairs when possible, park farther away from his job, grocery stores, etc.        RMaximino Greenland MD    THE PATIENT IS ENCOURAGED TO PRACTICE SOCIAL DISTANCING DUE TO THE COVID-19 PANDEMIC.

## 2019-11-02 ENCOUNTER — Other Ambulatory Visit: Payer: Self-pay

## 2019-11-02 MED ORDER — LEVOTHYROXINE SODIUM 100 MCG PO TABS: 100.0000 ug | ORAL_TABLET | Freq: Every day | ORAL | 0 refills | Status: DC

## 2019-11-10 ENCOUNTER — Other Ambulatory Visit: Payer: Self-pay | Admitting: Gastroenterology

## 2019-11-21 ENCOUNTER — Other Ambulatory Visit (HOSPITAL_COMMUNITY)
Admission: RE | Admit: 2019-11-21 | Discharge: 2019-11-21 | Disposition: A | Discharge status: Home / Self Care | Payer: Managed Care, Other (non HMO) | Source: Ambulatory Visit | Attending: Gastroenterology | Admitting: Gastroenterology

## 2019-11-21 DIAGNOSIS — Z01812 Encounter for preprocedural laboratory examination: Secondary | ICD-10-CM | POA: Insufficient documentation

## 2019-11-21 DIAGNOSIS — Z20828 Contact with and (suspected) exposure to other viral communicable diseases: Secondary | ICD-10-CM | POA: Diagnosis not present

## 2019-11-22 LAB — NOVEL CORONAVIRUS, NAA (HOSP ORDER, SEND-OUT TO REF LAB; TAT 18-24 HRS): SARS-CoV-2, NAA: NOT DETECTED

## 2019-11-23 ENCOUNTER — Other Ambulatory Visit: Payer: Self-pay

## 2019-11-23 ENCOUNTER — Encounter (HOSPITAL_COMMUNITY): Payer: Self-pay | Admitting: Emergency Medicine

## 2019-11-23 NOTE — Progress Notes (Addendum)
  Pre-op endo call completed.    Hx of CABG 2018. Underwent cardiac rehab .  Cardiologist is Dr Skeet Latch who he has not seen in office for greater than 1 year. He reports [facility]'s office called him some months ago to tell him they saw the EKG ordered by his PCP and that is it was normal and he did not require an appt at that time. Patient denies any acute cardiac symptoms at this time.  Hx of OSA, he has not used is CPAP for greater than 1 year

## 2019-11-23 NOTE — Anesthesia Preprocedure Evaluation (Addendum)
Anesthesia Evaluation  Patient identified by MRN, date of birth, ID band Patient awake    Reviewed: Allergy & Precautions, NPO status , Patient's Chart, lab work & pertinent test results  Airway Mallampati: III  TM Distance: >3 FB Neck ROM: Full    Dental  (+) Dental Advisory Given, Teeth Intact   Pulmonary sleep apnea ,    breath sounds clear to auscultation       Cardiovascular hypertension, Pt. on medications + angina + CAD and + Peripheral Vascular Disease   Rhythm:Regular Rate:Normal  Echo 04/2017  Left ventricle: Normal cavity size. Concentric hypertrophy. LV systolic function is normal with an EF of 55-60%. There are no obvious wall motion abnormalities.  Septum: No Patent Foramen Ovale present.  Left atrium: Patent foramen ovale not present.  Aortic valve: The valve is trileaflet. No stenosis. No regurgitation.  Right ventricle: Normal cavity size and ejection fraction.   Neuro/Psych negative neurological ROS     GI/Hepatic negative GI ROS, Neg liver ROS,   Endo/Other  diabetes, Type 2Hypothyroidism   Renal/GU negative Renal ROS     Musculoskeletal   Abdominal   Peds  Hematology negative hematology ROS (+)   Anesthesia Other Findings   Reproductive/Obstetrics                            Lab Results  Component Value Date   WBC 9.5 05/23/2019   HGB 15.4 05/23/2019   HCT 45.7 05/23/2019   MCV 88 05/23/2019   PLT 307 05/23/2019   Lab Results  Component Value Date   CREATININE 0.88 10/04/2019   BUN 12 10/04/2019   NA 142 10/04/2019   K 4.2 10/04/2019   CL 103 10/04/2019   CO2 25 10/04/2019    Anesthesia Physical  Anesthesia Plan  ASA: III  Anesthesia Plan: MAC   Post-op Pain Management:    Induction:   PONV Risk Score and Plan: 1 and Propofol infusion, Ondansetron and Treatment may vary due to age or medical condition  Airway Management Planned: Natural  Airway  Additional Equipment:   Intra-op Plan:   Post-operative Plan:   Informed Consent: I have reviewed the patients History and Physical, chart, labs and discussed the procedure including the risks, benefits and alternatives for the proposed anesthesia with the patient or authorized representative who has indicated his/her understanding and acceptance.     Dental advisory given  Plan Discussed with: CRNA  Anesthesia Plan Comments:         Anesthesia Quick Evaluation

## 2019-11-23 NOTE — Progress Notes (Signed)
Pre-op endo call attempted. No answer. Lmtcb.  

## 2019-11-24 ENCOUNTER — Ambulatory Visit (HOSPITAL_COMMUNITY): Payer: Managed Care, Other (non HMO) | Admitting: Anesthesiology

## 2019-11-24 ENCOUNTER — Encounter (HOSPITAL_COMMUNITY): Payer: Self-pay | Admitting: *Deleted

## 2019-11-24 ENCOUNTER — Other Ambulatory Visit: Payer: Self-pay

## 2019-11-24 ENCOUNTER — Ambulatory Visit (HOSPITAL_COMMUNITY)
Admission: RE | Admit: 2019-11-24 | Discharge: 2019-11-24 | Disposition: A | Discharge status: Home / Self Care | Payer: Managed Care, Other (non HMO) | Attending: Gastroenterology | Admitting: Gastroenterology

## 2019-11-24 ENCOUNTER — Encounter (HOSPITAL_COMMUNITY)
Admission: RE | Disposition: A | Discharge status: Home / Self Care | Payer: Self-pay | Source: Home / Self Care | Attending: Gastroenterology

## 2019-11-24 DIAGNOSIS — Z7984 Long term (current) use of oral hypoglycemic drugs: Secondary | ICD-10-CM | POA: Insufficient documentation

## 2019-11-24 DIAGNOSIS — Z1211 Encounter for screening for malignant neoplasm of colon: Secondary | ICD-10-CM | POA: Insufficient documentation

## 2019-11-24 DIAGNOSIS — E7800 Pure hypercholesterolemia, unspecified: Secondary | ICD-10-CM | POA: Insufficient documentation

## 2019-11-24 DIAGNOSIS — I25119 Atherosclerotic heart disease of native coronary artery with unspecified angina pectoris: Secondary | ICD-10-CM | POA: Diagnosis not present

## 2019-11-24 DIAGNOSIS — Z7982 Long term (current) use of aspirin: Secondary | ICD-10-CM | POA: Insufficient documentation

## 2019-11-24 DIAGNOSIS — E1151 Type 2 diabetes mellitus with diabetic peripheral angiopathy without gangrene: Secondary | ICD-10-CM | POA: Diagnosis not present

## 2019-11-24 DIAGNOSIS — Z951 Presence of aortocoronary bypass graft: Secondary | ICD-10-CM | POA: Insufficient documentation

## 2019-11-24 DIAGNOSIS — G4733 Obstructive sleep apnea (adult) (pediatric): Secondary | ICD-10-CM | POA: Diagnosis not present

## 2019-11-24 DIAGNOSIS — E039 Hypothyroidism, unspecified: Secondary | ICD-10-CM | POA: Insufficient documentation

## 2019-11-24 DIAGNOSIS — Z79890 Hormone replacement therapy (postmenopausal): Secondary | ICD-10-CM | POA: Diagnosis not present

## 2019-11-24 DIAGNOSIS — I10 Essential (primary) hypertension: Secondary | ICD-10-CM | POA: Insufficient documentation

## 2019-11-24 DIAGNOSIS — F419 Anxiety disorder, unspecified: Secondary | ICD-10-CM | POA: Diagnosis not present

## 2019-11-24 HISTORY — PX: COLONOSCOPY WITH PROPOFOL: SHX5780

## 2019-11-24 LAB — GLUCOSE, CAPILLARY: Glucose-Capillary: 103 mg/dL — ABNORMAL HIGH (ref 70–99)

## 2019-11-24 SURGERY — COLONOSCOPY WITH PROPOFOL
Anesthesia: Monitor Anesthesia Care

## 2019-11-24 MED ORDER — PROPOFOL 10 MG/ML IV BOLUS
INTRAVENOUS | Status: AC
  Filled 2019-11-24: qty 20

## 2019-11-24 MED ORDER — LIDOCAINE HCL (CARDIAC) PF 100 MG/5ML IV SOSY
PREFILLED_SYRINGE | INTRAVENOUS | Status: DC | PRN
  Administered 2019-11-24: 50 mg via INTRATRACHEAL

## 2019-11-24 MED ORDER — PROPOFOL 500 MG/50ML IV EMUL
INTRAVENOUS | Status: DC | PRN
  Administered 2019-11-24 (×3): 30 mg via INTRAVENOUS

## 2019-11-24 MED ORDER — LACTATED RINGERS IV SOLN: INTRAVENOUS | Status: DC

## 2019-11-24 MED ORDER — PROPOFOL 500 MG/50ML IV EMUL
INTRAVENOUS | Status: DC | PRN
  Administered 2019-11-24: 100 ug/kg/min via INTRAVENOUS

## 2019-11-24 MED ORDER — PROPOFOL 10 MG/ML IV BOLUS
INTRAVENOUS | Status: AC
  Filled 2019-11-24: qty 120

## 2019-11-24 MED ORDER — SODIUM CHLORIDE 0.9 % IV SOLN: INTRAVENOUS | Status: DC

## 2019-11-24 MED ORDER — GLYCOPYRROLATE 0.2 MG/ML IJ SOLN
INTRAMUSCULAR | Status: DC | PRN
  Administered 2019-11-24: .2 mg via INTRAVENOUS

## 2019-11-24 MED ORDER — PROPOFOL 500 MG/50ML IV EMUL
INTRAVENOUS | Status: AC
  Filled 2019-11-24: qty 150

## 2019-11-24 SURGICAL SUPPLY — 22 items

## 2019-11-24 NOTE — Op Note (Signed)
[address] [facility] Patient Name: [name] Hammond Procedure Date: 11/24/2019 MRN: [id number] Attending MD: Juanita Craver , MD Date of Birth: 12-Oct-1961 CSN: [id number] Age: 58 Admit Type: Outpatient Procedure:                Screening colonoscopy. Indications:              CRC screening for colorectal malignant neoplasm. Providers:                Nelwyn Salisbury, MD, Angus Seller, RN, Lina Sar, Technician, Phineas Inches CRNA Referring MD:             Glendale Chard, MD Medicines:                Monitored Anesthesia Care. Complications:            No immediate complications. Estimated Blood Loss:     Estimated blood loss: none. Procedure:                Pre-Anesthesia Assessment: - Prior to the                            procedure, a history and physical was performed,                            and patient medications and allergies were                            reviewed. The patient's tolerance of previous                            anesthesia was also reviewed. The risks and                            benefits of the procedure and the sedation options                            and risks were discussed with the patient. All                            questions were answered, and informed consent was                            obtained. Prior Anticoagulants: The patient has                            taken no previous anticoagulant or antiplatelet                            agents except for aspirin. ASA Grade Assessment: II                            - A patient with mild systemic disease. After  reviewing the risks and benefits, the patient was                            deemed in satisfactory condition to undergo the                            procedure. After obtaining informed consent, the                            colonoscope was passed under direct vision.                            Throughout the procedure, the  patient's blood                            pressure, pulse, and oxygen saturations were                            monitored continuously. The CF-[id number] ([id number])                            Olympus colonoscope was introduced through the anus                            and advanced to the the cecum, identified by                            appendiceal orifice and ileocecal valve. The                            colonoscopy was performed without difficulty. The                            patient tolerated the procedure well. The quality                            of the bowel preparation was good. The ileocecal                            valve, the appendiceal orifice and the rectum were                            photographed. The bowel preparation used was                            GoLYTELY via split dose instruction. Scope In: 7:26:44 AM Scope Out: 7:36:33 AM Scope Withdrawal Time: 0 hours 7 minutes 12 seconds  Total Procedure Duration: 0 hours 9 minutes 49 seconds  Findings:      The entire examined portion of the colon appeared normal.      No additional abnormalities were found on retroflexion. Impression:               - The entire examined colon is normal.                           -  No specimens collected. Moderate Sedation:      MAC used. Recommendation:           - High fiber, low fat diet with augmented water                            consumption daily.                           - Continue present medications.                           - Repeat colonoscopy in 7 years for surveillance.                           - Return to GI office PRN.                           - If the patient has any abnormal GI symptoms in                            the interim, he has been advised to call the office                            ASAP for further recommendations. Procedure Code(s):        --- Professional ---                           [phone], Colonoscopy, flexible; diagnostic, including                             collection of specimen(s) by brushing or washing,                            when performed (separate procedure) Diagnosis Code(s):        --- Professional ---                           Z12.11, Encounter for screening for malignant                            neoplasm of colon CPT copyright 2019 [employer]. All rights reserved. The codes documented in this report are preliminary and upon coder review may  be revised to meet current compliance requirements. Juanita Craver, MD Juanita Craver, MD 11/24/2019 7:45:34 AM This report has been signed electronically. Number of Addenda: 0

## 2019-11-24 NOTE — Transfer of Care (Signed)
Immediate Anesthesia Transfer of Care Note  Patient: [name] Hammond  Procedure(s) Performed: COLONOSCOPY WITH PROPOFOL (N/A )  Patient Location: Endoscopy Unit  Anesthesia Type:MAC  Level of Consciousness: awake, oriented and patient cooperative  Airway & Oxygen Therapy: Patient Spontanous Breathing and Patient connected to face mask oxygen  Post-op Assessment: Report given to RN and Post -op Vital signs reviewed and stable  Post vital signs: Reviewed and stable  Last Vitals:  Vitals Value Taken Time  BP    Temp    Pulse 73 11/24/19 0745  Resp 21 11/24/19 0745  SpO2 99 % 11/24/19 0745  Vitals shown include unvalidated device data.  Last Pain:  Vitals:   11/24/19 0646  TempSrc: Oral  PainSc: 0-No pain         Complications: No apparent anesthesia complications

## 2019-11-24 NOTE — H&P (Signed)
[name] Hammond is an 58 y.o. male.   Chief Complaint: Colorectal cancer screening. HPI: 58 year old [name] Hammond male here for a screening. See office noted for details.  Past Medical History:  Diagnosis Date  . Anxiety   . Diabetes mellitus without complication ([facility])    type 2   . Hypertension   . Hypothyroid   . Obstructive sleep apnea    CPAP  . Pure hypercholesterolemia   . PVD (peripheral vascular disease) ([facility])   . Testicular hypofunction   . Vitamin D deficiency    Past Surgical History:  Procedure Laterality Date  . CORONARY ARTERY BYPASS GRAFT N/A 05/12/2017   Procedure: CORONARY ARTERY BYPASS GRAFTING (CABG) x three , using left inernal mammary artery and left leg     greater saphenous vein harvested endoscopically;  Surgeon: Kerin Perna, MD;  Location: [facility] OR;  Service: Open Heart Surgery;  Laterality: N/A;  . COSMETIC SURGERY    . INTRAVASCULAR ULTRASOUND/IVUS N/A 05/08/2017   Procedure: Intravascular Ultrasound/IVUS;  Surgeon: Kathleene Hazel, MD;  Location: [facility] INVASIVE CV LAB;  Service: Cardiovascular;  Laterality: N/A;  . LEFT HEART CATH AND CORONARY ANGIOGRAPHY N/A 05/08/2017   Procedure: Left Heart Cath and Coronary Angiography;  Surgeon: Kathleene Hazel, MD;  Location: [facility] INVASIVE CV LAB;  Service: Cardiovascular;  Laterality: N/A;  . TEE WITHOUT CARDIOVERSION N/A 05/12/2017   Procedure: TRANSESOPHAGEAL ECHOCARDIOGRAM (TEE);  Surgeon: Donata Clay, Theron Arista, MD;  Location: [facility] OR;  Service: Open Heart Surgery;  Laterality: N/A;    Family History  Problem Relation Age of Onset  . Diabetes type II Mother   . Thyroid disease Mother   . Cancer - Other Father        Liver  . Hypertension Father   . Heart disease Father        Bypass surgery  . Thyroid disease Sister   . Diabetes type II Brother   . Diabetes type II Brother    Social History:  reports that he has never smoked. He has never used smokeless tobacco. He reports that he does not drink alcohol or  use drugs.  Allergies: No Known Allergies  Medications Prior to Admission  Medication Sig Dispense Refill  . aspirin 325 MG EC tablet Take 1 tablet (325 mg total) by mouth daily.    Marland Kitchen atorvastatin (LIPITOR) 80 MG tablet TAKE 1 TABLET(80 MG) BY MOUTH AT BEDTIME (Patient taking differently: Take 80 mg by mouth at bedtime. ) 90 tablet 1  . fluticasone (FLONASE) 50 MCG/ACT nasal spray Place 1 spray into both nostrils daily. 16 g 2  . levothyroxine (SYNTHROID) 100 MCG tablet Take 1 tablet (100 mcg total) by mouth daily. 90 tablet 0  . lisinopril-hydrochlorothiazide (ZESTORETIC) 20-12.5 MG tablet Take 1 tablet by mouth daily. 90 tablet 1  . metoprolol tartrate (LOPRESSOR) 25 MG tablet Take 0.5 tablets (12.5 mg total) by mouth 2 (two) times daily. 90 tablet 1  . Saxagliptin-Metformin (KOMBIGLYZE XR) 04-999 MG TB24 TAKE 1 TABLET BY MOUTH EVERY DAY WITH THE EVENING MEAL (Patient taking differently: Take 1 tablet by mouth daily with supper. TAKE 1 TABLET BY MOUTH EVERY DAY WITH THE EVENING MEAL) 90 tablet 1  . sertraline (ZOLOFT) 50 MG tablet Take 1 tablet (50 mg total) by mouth daily. 90 tablet 2    Results for orders placed or performed during the hospital encounter of 11/24/19 (from the past 48 hour(s))  Glucose, capillary     Status: Abnormal   Collection Time:  11/24/19  6:57 AM  Result Value Ref Range   Glucose-Capillary 103 (H) 70 - 99 mg/dL   No results found.  Review of Systems  Constitutional: Negative.   HENT: Negative.   Eyes: Negative.   Cardiovascular: Negative.   Gastrointestinal: Negative.   Skin: Negative.   Endo/Heme/Allergies: Negative.   Psychiatric/Behavioral: Positive for depression. Negative for hallucinations, memory loss, substance abuse and suicidal ideas. The patient is nervous/anxious. The patient does not have insomnia.    Blood pressure (!) 150/93, pulse 67, temperature 98.5 F (36.9 C), temperature source Oral, resp. rate 20, height 6\' 2"  (1.88 m), weight 131.5  kg, SpO2 98 %. Physical Exam  Constitutional: He is oriented to person, place, and time. He appears well-developed and well-nourished.  Morbid obesity  HENT:  Head: Normocephalic and atraumatic.  Eyes: Pupils are equal, round, and reactive to light. Conjunctivae and EOM are normal.  Neck: Normal range of motion. Neck supple.  Cardiovascular: Normal rate and regular rhythm.  Respiratory: Effort normal and breath sounds normal.  GI: Soft. Bowel sounds are normal.  Musculoskeletal: Normal range of motion.  Neurological: He is alert and oriented to person, place, and time.  Skin: Skin is warm and dry.  Psychiatric: He has a normal mood and affect. His behavior is normal. Thought content normal.    Assessment/Plan Colorectal cancer screening/personal history of adenomatous polyps: proceed with a colonoscopy at this time.  Juanita Craver, MD 11/24/2019, 7:11 AM

## 2019-11-24 NOTE — Anesthesia Postprocedure Evaluation (Signed)
Anesthesia Post Note  Patient: [name] Hammond  Procedure(s) Performed: COLONOSCOPY WITH PROPOFOL (N/A )     Patient location during evaluation: PACU Anesthesia Type: MAC Level of consciousness: awake and alert Pain management: pain level controlled Vital Signs Assessment: post-procedure vital signs reviewed and stable Respiratory status: spontaneous breathing Cardiovascular status: stable Anesthetic complications: no    Last Vitals:  Vitals:   11/24/19 0750 11/24/19 0800  BP: (!) 104/53 (!) 147/81  Pulse: 66 (!) 58  Resp: 12 16  Temp:    SpO2: 98% 97%    Last Pain:  Vitals:   11/24/19 0800  TempSrc:   PainSc: 0-No pain                 Nolon Nations

## 2019-11-24 NOTE — Discharge Instructions (Signed)
YOU HAD AN ENDOSCOPIC PROCEDURE TODAY: Refer to the procedure report and other information in the discharge instructions given to you for any specific questions about what was found during the examination. If this information does not answer your questions, please call [facility] at [phone] to clarify.  ° °YOU SHOULD EXPECT: Some feelings of bloating in the abdomen. Passage of more gas than usual. Walking can help get rid of the air that was put into your GI tract during the procedure and reduce the bloating. If you had a lower endoscopy (such as a colonoscopy or flexible sigmoidoscopy) you may notice spotting of blood in your stool or on the toilet paper. Some abdominal soreness may be present for a day or two, also. ° °DIET: Your first meal following the procedure should be a light meal and then it is ok to progress to your normal diet. A half-sandwich or bowl of soup is an example of a good first meal. Heavy or fried foods are harder to digest and may make you feel nauseous or bloated. Drink plenty of fluids but you should avoid alcoholic beverages for 24 hours. If you had an esophageal dilation, please see attached information for diet.  ° °ACTIVITY: Your care partner should take you home directly after the procedure. You should plan to take it easy, moving slowly for the rest of the day. You can resume normal activity the day after the procedure however YOU SHOULD NOT DRIVE, use power tools, machinery or perform tasks that involve climbing or major physical exertion for 24 hours (because of the sedation medicines used during the test).  ° °SYMPTOMS TO REPORT IMMEDIATELY: °A gastroenterologist can be reached at any hour. Please call [phone]  for any of the following symptoms:  °Following lower endoscopy (colonoscopy, flexible sigmoidoscopy) °Excessive amounts of blood in the stool  °Significant tenderness, worsening of abdominal pains  °Swelling of the abdomen that is new, acute  °Fever of  100° or higher  °Following upper endoscopy (EGD, EUS, ERCP, esophageal dilation) °Vomiting of blood or coffee ground material  °New, significant abdominal pain  °New, significant chest pain or pain under the shoulder blades  °Painful or persistently difficult swallowing  °New shortness of breath  °[place], tarry-looking or red, bloody stools ° °FOLLOW UP:  °If any biopsies were taken you will be contacted by phone or by letter within the next 1-3 weeks. Call [phone]  if you have not heard about the biopsies in 3 weeks.  °Please also call with any specific questions about appointments or follow up tests. ° °

## 2019-11-25 ENCOUNTER — Encounter (HOSPITAL_COMMUNITY): Payer: Self-pay | Admitting: Gastroenterology

## 2019-12-05 LAB — HM DIABETES EYE EXAM

## 2019-12-07 ENCOUNTER — Encounter: Payer: Self-pay | Admitting: Internal Medicine

## 2020-01-01 ENCOUNTER — Other Ambulatory Visit: Payer: Self-pay | Admitting: Internal Medicine

## 2020-01-02 ENCOUNTER — Other Ambulatory Visit: Payer: Self-pay | Admitting: Internal Medicine

## 2020-01-04 ENCOUNTER — Encounter: Payer: Self-pay | Admitting: Internal Medicine

## 2020-01-04 ENCOUNTER — Other Ambulatory Visit: Payer: Self-pay

## 2020-01-04 ENCOUNTER — Ambulatory Visit (INDEPENDENT_AMBULATORY_CARE_PROVIDER_SITE_OTHER): Payer: Managed Care, Other (non HMO) | Admitting: Internal Medicine

## 2020-01-04 VITALS — BP 142/96 | HR 68 | Temp 97.4°F | Ht 74.0 in | Wt 300.0 lb

## 2020-01-04 DIAGNOSIS — E1165 Type 2 diabetes mellitus with hyperglycemia: Secondary | ICD-10-CM

## 2020-01-04 DIAGNOSIS — E66812 Obesity, class 2: Secondary | ICD-10-CM

## 2020-01-04 DIAGNOSIS — E039 Hypothyroidism, unspecified: Secondary | ICD-10-CM

## 2020-01-04 DIAGNOSIS — Z6838 Body mass index (BMI) 38.0-38.9, adult: Secondary | ICD-10-CM

## 2020-01-04 DIAGNOSIS — I119 Hypertensive heart disease without heart failure: Secondary | ICD-10-CM | POA: Diagnosis not present

## 2020-01-04 MED ORDER — LISINOPRIL-HYDROCHLOROTHIAZIDE 20-12.5 MG PO TABS: 1.0000 | ORAL_TABLET | Freq: Every day | ORAL | 2 refills | Status: DC

## 2020-01-04 MED ORDER — ATORVASTATIN CALCIUM 80 MG PO TABS: 80.0000 mg | ORAL_TABLET | Freq: Every day | ORAL | 2 refills | Status: DC

## 2020-01-04 NOTE — Progress Notes (Signed)
This visit occurred during the SARS-CoV-2 public health emergency.  Safety protocols were in place, including screening questions prior to the visit, additional usage of staff PPE, and extensive cleaning of exam room while observing appropriate contact time as indicated for disinfecting solutions.  Subjective:     Patient ID: [name] Hammond , male    DOB: [birth year] , 59 y.o.   MRN: [id number]   Chief Complaint  Patient presents with  . Diabetes  . Hypertension    HPI  Diabetes He presents for his follow-up diabetic visit. He has type 2 diabetes mellitus. There are no hypoglycemic associated symptoms. Pertinent negatives for diabetes include no blurred vision and no chest pain. There are no hypoglycemic complications. Diabetic complications include heart disease. Risk factors for coronary artery disease include diabetes mellitus, dyslipidemia, male sex, hypertension, obesity and sedentary lifestyle. He participates in exercise intermittently. His breakfast blood glucose is taken between 8-9 am. His breakfast blood glucose range is generally 90-110 mg/dl. An ACE inhibitor/angiotensin II receptor blocker is being taken. He does not see a podiatrist.Eye exam is current.  Hypertension This is a chronic problem. The current episode started more than 1 year ago. The problem has been gradually improving since onset. The problem is controlled. Pertinent negatives include no blurred vision, chest pain, palpitations or shortness of breath. Risk factors for coronary artery disease include diabetes mellitus, dyslipidemia, obesity, sedentary lifestyle and male gender. The current treatment provides moderate improvement. Compliance problems include exercise.  Hypertensive end-organ damage includes CAD/MI.     Past Medical History:  Diagnosis Date  . Anxiety   . Diabetes mellitus without complication ([place])    type 2   . Hypertension   . Hypothyroid   . Obstructive sleep apnea    CPAP  . Pure  hypercholesterolemia   . PVD (peripheral vascular disease) ([place])   . Testicular hypofunction   . Vitamin D deficiency      Family History  Problem Relation Age of Onset  . Diabetes type II Mother   . Thyroid disease Mother   . Cancer - Other Father        Liver  . Hypertension Father   . Heart disease Father        Bypass surgery  . Thyroid disease Sister   . Diabetes type II Brother   . Diabetes type II Brother      Current Outpatient Medications:  .  aspirin 325 MG EC tablet, Take 1 tablet (325 mg total) by mouth daily., Disp: , Rfl:  .  atorvastatin (LIPITOR) 80 MG tablet, Take 1 tablet (80 mg total) by mouth at bedtime., Disp: 90 tablet, Rfl: 2 .  fluticasone (FLONASE) 50 MCG/ACT nasal spray, SHAKE LIQUID AND USE 1 SPRAY IN EACH NOSTRIL DAILY, Disp: 16 g, Rfl: 2 .  levothyroxine (SYNTHROID) 100 MCG tablet, Take 1 tablet (100 mcg total) by mouth daily., Disp: 90 tablet, Rfl: 0 .  lisinopril-hydrochlorothiazide (ZESTORETIC) 20-12.5 MG tablet, Take 1 tablet by mouth daily., Disp: 90 tablet, Rfl: 2 .  metoprolol tartrate (LOPRESSOR) 25 MG tablet, TAKE 1/2 TABLET(12.5 MG) BY MOUTH TWICE DAILY, Disp: 90 tablet, Rfl: 1 .  Saxagliptin-Metformin (KOMBIGLYZE XR) 04-999 MG TB24, TAKE 1 TABLET BY MOUTH EVERY DAY WITH THE EVENING MEAL (Patient taking differently: Take 1 tablet by mouth daily with supper. TAKE 1 TABLET BY MOUTH EVERY DAY WITH THE EVENING MEAL), Disp: 90 tablet, Rfl: 1 .  sertraline (ZOLOFT) 50 MG tablet, Take 1 tablet (50 mg total) by mouth  daily., Disp: 90 tablet, Rfl: 2   No Known Allergies   Review of Systems  Constitutional: Negative.   Eyes: Negative for blurred vision.  Respiratory: Negative.  Negative for shortness of breath.   Cardiovascular: Negative.  Negative for chest pain and palpitations.  Gastrointestinal: Negative.   Neurological: Negative.   Psychiatric/Behavioral: Negative.      Today's Vitals   01/04/20 1126  BP: (!) 142/96  Pulse: 68  Temp:  (!) 97.4 F (36.3 C)  TempSrc: Oral  Weight: 300 lb (136.1 kg)  Height: 6' 2"  (1.88 m)   Body mass index is 38.52 kg/m.   Objective:  Physical Exam Vitals and nursing note reviewed.  Constitutional:      Appearance: Normal appearance. He is obese.  Cardiovascular:     Rate and Rhythm: Normal rate and regular rhythm.     Heart sounds: Normal heart sounds.  Pulmonary:     Effort: Pulmonary effort is normal.     Breath sounds: Normal breath sounds.  Skin:    General: Skin is warm.  Neurological:     General: No focal deficit present.     Mental Status: He is alert.  Psychiatric:        Mood and Affect: Mood normal.         Assessment And Plan:     1. Uncontrolled type 2 diabetes mellitus with hyperglycemia (HCC)  Chronic, yet stable. I will check labs as listed below. He is currently on DPP4/metformin combo. I suggested switching him to GLP-1; however, he declines. I will discuss possibility of Rybelsus at his next visit. If he still declines, will add SGLT-2 to his regimen.   - CMP14+EGFR - Hemoglobin A1c  2. Benign hypertensive heart disease without heart failure  Chronic, fair control. He has not taken his meds today. Importance of medication and dietary compliance was discussed with the patient.   3. Primary hypothyroidism  I will check thyroid panel and adjust meds as needed.  - TSH - T4, Free  4. Class 2 severe obesity due to excess calories with serious comorbidity and body mass index (BMI) of 38.0 to 38.9 in adult [facility])  He is encouraged to strive for BMI less than 30 to decrease cardiac risk. His initial goal should be to lose ten percent of his body weight, 30 pounds. He is encouraged to aim for at least 150 minutes of exercise per week.   [name], MD    THE PATIENT IS ENCOURAGED TO PRACTICE SOCIAL DISTANCING DUE TO THE COVID-19 PANDEMIC.

## 2020-01-05 LAB — CMP14+EGFR
ALT: 40 IU/L (ref 0–44)
AST: 31 IU/L (ref 0–40)
Albumin/Globulin Ratio: 1.5 (ref 1.2–2.2)
Albumin: 4.3 g/dL (ref 3.8–4.9)
Alkaline Phosphatase: 113 IU/L (ref 39–117)
BUN/Creatinine Ratio: 15 (ref 9–20)
BUN: 12 mg/dL (ref 6–24)
Bilirubin Total: 0.4 mg/dL (ref 0.0–1.2)
CO2: 26 mmol/L (ref 20–29)
Calcium: 9.3 mg/dL (ref 8.7–10.2)
Chloride: 96 mmol/L (ref 96–106)
Creatinine, Ser: 0.81 mg/dL (ref 0.76–1.27)
GFR calc Af Amer: 113 mL/min/{1.73_m2} (ref >59)
GFR calc non Af Amer: 98 mL/min/{1.73_m2} (ref >59)
Globulin, Total: 2.9 g/dL (ref 1.5–4.5)
Glucose: 143 mg/dL — ABNORMAL HIGH (ref 65–99)
Potassium: 4.3 mmol/L (ref 3.5–5.2)
Sodium: 137 mmol/L (ref 134–144)
Total Protein: 7.2 g/dL (ref 6.0–8.5)

## 2020-01-05 LAB — HEMOGLOBIN A1C
Est. average glucose Bld gHb Est-mCnc: 151 mg/dL
Hgb A1c MFr Bld: 6.9 % — ABNORMAL HIGH (ref 4.8–5.6)

## 2020-01-05 LAB — TSH: TSH: 2.62 u[IU]/mL (ref 0.450–4.500)

## 2020-01-05 LAB — T4, FREE: Free T4: 1.32 ng/dL (ref 0.82–1.77)

## 2020-01-07 NOTE — Patient Instructions (Signed)

## 2020-01-31 ENCOUNTER — Other Ambulatory Visit: Payer: Self-pay | Admitting: Internal Medicine

## 2020-03-29 ENCOUNTER — Other Ambulatory Visit: Payer: Self-pay | Admitting: Internal Medicine

## 2020-04-09 ENCOUNTER — Other Ambulatory Visit: Payer: Self-pay | Admitting: Internal Medicine

## 2020-04-12 ENCOUNTER — Encounter: Payer: Self-pay | Admitting: Cardiovascular Disease

## 2020-04-12 ENCOUNTER — Other Ambulatory Visit: Payer: Self-pay

## 2020-04-12 ENCOUNTER — Ambulatory Visit (INDEPENDENT_AMBULATORY_CARE_PROVIDER_SITE_OTHER): Payer: Managed Care, Other (non HMO) | Admitting: Cardiovascular Disease

## 2020-04-12 DIAGNOSIS — G4733 Obstructive sleep apnea (adult) (pediatric): Secondary | ICD-10-CM | POA: Diagnosis not present

## 2020-04-12 DIAGNOSIS — I25810 Atherosclerosis of coronary artery bypass graft(s) without angina pectoris: Secondary | ICD-10-CM

## 2020-04-12 DIAGNOSIS — Z9989 Dependence on other enabling machines and devices: Secondary | ICD-10-CM

## 2020-04-12 DIAGNOSIS — Z951 Presence of aortocoronary bypass graft: Secondary | ICD-10-CM | POA: Diagnosis not present

## 2020-04-12 NOTE — Progress Notes (Signed)
Cardiology Office Note   Date:  04/12/2020   ID:  [name] Hammond, DOB 18-Aug-1961, MRN [id number]  PCP:  [name], MD  Cardiologist:   Chilton Si, MD   No chief complaint on file.    History of Present Illness: [name] Hammond is a 59 y.o. male with CAD s/p CABG, hypertension, hypothyroidism and diabetes who is here for follow up.  He was first seen 03/2017 with chest pain.  He was referred for ETT 04/2017 that was positive for ischemia.  He then had a LHC with obstructive disease in the LAD and LCx territories.  Echo revealed LVEF 60-65% and mild LVH.  He underwent CABG (LIMA-->LAD, SVG-->D, SVG-->OM) on 05/13/17.  His surgery was complicated by a hematoma and bleeding from the leg vein graft removal site.  His procedure was otherwise uncomplicated. He followed up with Azalee Course, PA, on 05/2017. He has participated in cardiac rehabilitation and is somewhat frustrated by the fact that he cannot work out more strenuously yet.   He enjoys the cardio but wants to start lifting weights.  He denies chest pain or shortness of breath.  He continues to some numbness in his chest around the incision site. He sometimes also has a prickling sensation. He has no lower extremity edema, orthopnea, or PND. 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  He followed up with Dr. Allyne Gee on 12/2019 and his blood pressure was 142/96.  Lately he has been feeling OK.  He isn't getting any exercise but plans to go back to [employer] soon.  He works long hours and would like starting to work out overnight when [employer] isn't very populated.  He notes that his breathing has improved since his surgery.  He has occasional episodes of chest discomfort but they are not with exertion.  He struggles with eating out and processed foods.  He notes that his sodium intake is probably high.  He has no lower extremity edema, orthopnea, or PND.  Past Medical History:  Diagnosis  Date  . Anxiety   . Diabetes mellitus without complication (HCC)    type 2   . Hypertension   . Hypothyroid   . Obstructive sleep apnea    CPAP  . Pure hypercholesterolemia   . PVD (peripheral vascular disease) (HCC)   . Testicular hypofunction   . Vitamin D deficiency     Past Surgical History:  Procedure Laterality Date  . COLONOSCOPY WITH PROPOFOL N/A 11/24/2019   Procedure: COLONOSCOPY WITH PROPOFOL;  Surgeon: Charna Elizabeth, MD;  Location: [facility];  Service: Endoscopy;  Laterality: N/A;  . CORONARY ARTERY BYPASS GRAFT N/A 05/12/2017   Procedure: CORONARY ARTERY BYPASS GRAFTING (CABG) x three , using left inernal mammary artery and left leg     greater saphenous vein harvested endoscopically;  Surgeon: Kerin Perna, MD;  Location: [facility] OR;  Service: Open Heart Surgery;  Laterality: N/A;  . COSMETIC SURGERY    . INTRAVASCULAR ULTRASOUND/IVUS N/A 05/08/2017   Procedure: Intravascular Ultrasound/IVUS;  Surgeon: Kathleene Hazel, MD;  Location: [facility] INVASIVE CV LAB;  Service: Cardiovascular;  Laterality: N/A;  . LEFT HEART CATH AND CORONARY ANGIOGRAPHY N/A 05/08/2017   Procedure: Left Heart Cath and Coronary Angiography;  Surgeon: Kathleene Hazel, MD;  Location: [facility] INVASIVE CV LAB;  Service: Cardiovascular;  Laterality: N/A;  . TEE WITHOUT CARDIOVERSION N/A 05/12/2017   Procedure: TRANSESOPHAGEAL ECHOCARDIOGRAM (TEE);  Surgeon: Donata Clay,  Collier Salina, MD;  Location: [place];  Service: Open Heart Surgery;  Laterality: N/A;     Current Outpatient Medications  Medication Sig Dispense Refill  . aspirin EC 81 MG tablet Take 81 mg by mouth daily.    Marland Kitchen atorvastatin (LIPITOR) 80 MG tablet Take 1 tablet (80 mg total) by mouth at bedtime. 90 tablet 2  . fluticasone (FLONASE) 50 MCG/ACT nasal spray SHAKE LIQUID AND USE 1 SPRAY IN EACH NOSTRIL DAILY 16 g 2  . KOMBIGLYZE XR 04-999 MG TB24 TAKE 1 TABLET BY MOUTH EVERY DAY WITH THE EVENING MEAL 90 tablet 1  . levothyroxine (SYNTHROID) 100  MCG tablet TAKE 1 TABLET(100 MCG) BY MOUTH DAILY 90 tablet 0  . lisinopril-hydrochlorothiazide (ZESTORETIC) 20-12.5 MG tablet TAKE 1 TABLET BY MOUTH DAILY 90 tablet 1  . metoprolol tartrate (LOPRESSOR) 25 MG tablet TAKE 1/2 TABLET(12.5 MG) BY MOUTH TWICE DAILY 90 tablet 1  . sertraline (ZOLOFT) 50 MG tablet Take 1 tablet (50 mg total) by mouth daily. 90 tablet 2   No current facility-administered medications for this visit.    Allergies:   Patient has no known allergies.    Social History:  The patient  reports that he has never smoked. He has never used smokeless tobacco. He reports that he does not drink alcohol or use drugs.   Family History:  The patient's family history includes Cancer - Other in his father; Diabetes type II in his brother, brother, and mother; Heart disease in his father; Hypertension in his father; Thyroid disease in his mother and sister.    ROS:  Please see the history of present illness.   Otherwise, review of systems are positive for none.   All other systems are reviewed and negative.    PHYSICAL EXAM: VS:  BP 140/82   Pulse 69   Ht 6\' 3"  (1.905 m)   Wt (!) 303 lb (137.4 kg)   SpO2 92%   BMI 37.87 kg/m  , BMI Body mass index is 37.87 kg/m. GENERAL:  Well appearing HEENT: Pupils equal round and reactive, fundi not visualized, oral mucosa unremarkable NECK:  No jugular venous distention, waveform within normal limits, carotid upstroke brisk and symmetric, no bruits, no thyromegaly LYMPHATICS:  No cervical adenopathy LUNGS:  Clear to auscultation bilaterally HEART:  RRR.  PMI not displaced or sustained,S1 and S2 within normal limits, no S3, no S4, no clicks, no rubs, no murmurs ABD:  Flat, positive bowel sounds normal in frequency in pitch, no bruits, no rebound, no guarding, no midline pulsatile mass, no hepatomegaly, no splenomegaly EXT:  2 plus pulses throughout, no edema, no cyanosis no clubbing SKIN:  No rashes no nodules NEURO:  Cranial nerves II  through XII grossly intact, motor grossly intact throughout PSYCH:  Cognitively intact, oriented to person place and time   EKG:  EKG is ordered today. The ekg ordered 04/13/17 demonstrates sinus rhythm.  Rate 64 bpm. 04/12/20: Sinus rhythm.  Rate 69 bpm.  Echo 05/09/17:  Study Conclusions  - Left ventricle: The cavity size was normal. Wall thickness was   increased in a pattern of mild LVH. Systolic function was normal.   The estimated ejection fraction was in the range of 60% to 65%.   Wall motion was normal; there were no regional wall motion   abnormalities. Left ventricular diastolic function parameters   were normal. - Tricuspid valve: There was trivial regurgitation. - Pulmonary arteries: Systolic pressure could not be accurately   estimated. - Pericardium, extracardiac: There  was no pericardial effusion.  Impressions:  - Mild LVH with LVEF 60-65% and normal diastolic function. Trivial   tricuspid regurgitation.  LHC 05/08/17:  Mid RCA lesion, 10 %stenosed.  Ost LM to LM lesion, 50 %stenosed.  Ost Cx to Prox Cx lesion, 60 %stenosed.  Ost LAD to Prox LAD lesion, 90 %stenosed.  LM lesion, 50 %stenosed.  3rd Mrg lesion, 30 %stenosed.  The left ventricular ejection fraction is greater than 65% by visual estimate.  The left ventricular systolic function is normal.  LV end diastolic pressure is normal.  There is no mitral valve regurgitation.  Ost 1st Diag to 1st Diag lesion, 50 %stenosed.   1. Severe ostial LAD stenosis with moderate stenosis in the distal left main artery and ostial Circumflex artery. This is confirmed with IVUS imaging.  2. Mild plaque in the mid RCA and third OM branch 3. Moderate stenosis in the first diagonal branch 4. Normal LV systolic function   Recent Labs: 05/23/2019: Hemoglobin 15.4; Platelets 307 01/04/2020: ALT 40; BUN 12; Creatinine, Ser 0.81; Potassium 4.3; Sodium 137; TSH 2.620   2/18:   TSH 2.3.  total cholesterol 139,  triglyerides 73, HDL 39, LDL 85  WBC 8.3, hemoglon 94.1, hematcrit 42.9, atelets 278 Hemoglobin A1c 6.5  Sodium 139, potassium 4.1, BUN 10, creatinine 0.86  AST 30, ALT 37  Lipid Panel    Component Value Date/Time   CHOL 114 05/23/2019 1151   TRIG 120 05/23/2019 1151   HDL 37 (L) 05/23/2019 1151   CHOLHDL 3.1 05/23/2019 1151   CHOLHDL 3.4 05/09/2017 0254   VLDL 19 05/09/2017 0254   LDLCALC 53 05/23/2019 1151      Wt Readings from Last 3 Encounters:  04/12/20 (!) 303 lb (137.4 kg)  01/04/20 300 lb (136.1 kg)  11/24/19 290 lb (131.5 kg)      ASSESSMENT AND PLAN:  # CAD s/p CABG: # Hyperlipidemia: Mr. Huckeby is doing well.   Continue aspirin, atorvastatin, and metoprolol.  Lipids are at goal.  # Hypertension: Blood pressure was elevated both initially and on repeat.  He has significant room for improvement in diet and exercise.  We will place a referral to the healthy weight and wellness clinic again.  We discussed limiting sodium intake and have given him information on the [employer].  Continue lisinopril, hydrochlorothiazide, and metoprolol for now.  If his blood pressure remains elevated in 3 months we will need to make some changes.  # Morbid obesity: Healthy weight and wellness clinic as above.   All medicines are reviewed at length with the patient today.  The patient does not have concerns regarding medicines.  The following changes have been made:  no change  Labs/ tests ordered today include:   Orders Placed This Encounter  Procedures  . Ambulatory referral to [facility]  . EKG 12-Lead     Disposition:   FU with Litzy Dicker C. Duke Salvia, MD, [facility] in 3 months.     Signed, Takako Minckler C. Duke Salvia, MD, [facility]  04/12/2020 1:23 PM    [facility]

## 2020-04-12 NOTE — Patient Instructions (Addendum)
Medication Instructions:  STOP ASPIRIN 325 MG DAILY  START ASPIRIN 81 MG DAILY   *If you need a refill on your cardiac medications before your next appointment, please call your pharmacy*  Lab Work: NONE   Testing/Procedures: NONE  Follow-Up: At [facility], you and your health needs are our priority.  As part of our continuing mission to provide you with exceptional heart care, we have created designated Provider Care Teams.  These Care Teams include your primary Cardiologist (physician) and Advanced Practice Providers (APPs -  Physician Assistants and Nurse Practitioners) who all work together to provide you with the care you need, when you need it.  We recommend signing up for the patient portal called "MyChart".  Sign up information is provided on this After Visit Summary.  MyChart is used to connect with patients for Virtual Visits (Telemedicine).  Patients are able to view lab/test results, encounter notes, upcoming appointments, etc.  Non-urgent messages can be sent to your provider as well.   To learn more about what you can do with MyChart, go to [email or web address].    Your next appointment:   3 month(s)  The format for your next appointment:   In Person  Provider:   You may see DR [facility]  or one of the following Advanced Practice Providers on your designated Care Team:    Corine Shelter, PA-C  [place], [place]  Edd Fabian, [place]   Other Instructions WORK HARDER ON DIET AND EXERCISE  You have been referred to   Where: [facility] Address: [address] [place] [address] Phone: [phone]    DASH Eating Plan DASH stands for "Dietary Approaches to Stop Hypertension." The DASH eating plan is a healthy eating plan that has been shown to reduce high blood pressure (hypertension). It may also reduce your risk for type 2 diabetes, heart disease, and stroke. The DASH eating plan may also help with weight loss. What  are tips for following this plan?  General guidelines  Avoid eating more than 2,300 mg (milligrams) of salt (sodium) a day. If you have hypertension, you may need to reduce your sodium intake to 1,500 mg a day.  Limit alcohol intake to no more than 1 drink a day for nonpregnant women and 2 drinks a day for men. One drink equals 12 oz of beer, 5 oz of wine, or 1 oz of hard liquor.  Work with your health care provider to maintain a healthy body weight or to lose weight. Ask what an ideal weight is for you.  Get at least 30 minutes of exercise that causes your heart to beat faster (aerobic exercise) most days of the week. Activities may include walking, swimming, or biking.  Work with your health care provider or diet and nutrition specialist (dietitian) to adjust your eating plan to your individual calorie needs. Reading food labels   Check food labels for the amount of sodium per serving. Choose foods with less than 5 percent of the Daily Value of sodium. Generally, foods with less than 300 mg of sodium per serving fit into this eating plan.  To find whole grains, look for the word "whole" as the first word in the ingredient list. Shopping  Buy products labeled as "low-sodium" or "no salt added."  Buy fresh foods. Avoid canned foods and premade or frozen meals. Cooking  Avoid adding salt when cooking. Use salt-free seasonings or herbs instead of table salt or sea salt. Check with your health  care provider or pharmacist before using salt substitutes.  Do not fry foods. Cook foods using healthy methods such as baking, boiling, grilling, and broiling instead.  Cook with heart-healthy oils, such as olive, canola, soybean, or sunflower oil. Meal planning  Eat a balanced diet that includes: ? 5 or more servings of fruits and vegetables each day. At each meal, try to fill half of your plate with fruits and vegetables. ? Up to 6-8 servings of whole grains each day. ? Less than 6 oz of  lean meat, poultry, or fish each day. A 3-oz serving of meat is about the same size as a deck of cards. One egg equals 1 oz. ? 2 servings of low-fat dairy each day. ? A serving of nuts, seeds, or beans 5 times each week. ? Heart-healthy fats. Healthy fats called Omega-3 fatty acids are found in foods such as flaxseeds and coldwater fish, like sardines, salmon, and mackerel.  Limit how much you eat of the following: ? Canned or prepackaged foods. ? Food that is high in trans fat, such as fried foods. ? Food that is high in saturated fat, such as fatty meat. ? Sweets, desserts, sugary drinks, and other foods with added sugar. ? Full-fat dairy products.  Do not salt foods before eating.  Try to eat at least 2 vegetarian meals each week.  Eat more home-cooked food and less restaurant, buffet, and fast food.  When eating at a restaurant, ask that your food be prepared with less salt or no salt, if possible. What foods are recommended? The items listed may not be a complete list. Talk with your dietitian about what dietary choices are best for you. Grains Whole-grain or whole-wheat bread. Whole-grain or whole-wheat pasta. Brown rice. Orpah Cobb. Bulgur. Whole-grain and low-sodium cereals. Pita bread. Low-fat, low-sodium crackers. Whole-wheat flour tortillas. Vegetables Fresh or frozen vegetables (raw, steamed, roasted, or grilled). Low-sodium or reduced-sodium tomato and vegetable juice. Low-sodium or reduced-sodium tomato sauce and tomato paste. Low-sodium or reduced-sodium canned vegetables. Fruits All fresh, dried, or frozen fruit. Canned fruit in natural juice (without added sugar). Meat and other protein foods Skinless chicken or [place]. Ground chicken or [place]. Pork with fat trimmed off. Fish and seafood. Egg whites. Dried beans, peas, or lentils. Unsalted nuts, nut butters, and seeds. Unsalted canned beans. Lean cuts of beef with fat trimmed off. Low-sodium, lean deli  meat. Dairy Low-fat (1%) or fat-free (skim) milk. Fat-free, low-fat, or reduced-fat cheeses. Nonfat, low-sodium ricotta or cottage cheese. Low-fat or nonfat yogurt. Low-fat, low-sodium cheese. Fats and oils Soft margarine without trans fats. Vegetable oil. Low-fat, reduced-fat, or light mayonnaise and salad dressings (reduced-sodium). Canola, safflower, olive, soybean, and sunflower oils. Avocado. Seasoning and other foods Herbs. Spices. Seasoning mixes without salt. Unsalted popcorn and pretzels. Fat-free sweets. What foods are not recommended? The items listed may not be a complete list. Talk with your dietitian about what dietary choices are best for you. Grains Baked goods made with fat, such as croissants, muffins, or some breads. Dry pasta or rice meal packs. Vegetables Creamed or fried vegetables. Vegetables in a cheese sauce. Regular canned vegetables (not low-sodium or reduced-sodium). Regular canned tomato sauce and paste (not low-sodium or reduced-sodium). Regular tomato and vegetable juice (not low-sodium or reduced-sodium). Rosita Fire. Olives. Fruits Canned fruit in a light or heavy syrup. Fried fruit. Fruit in cream or butter sauce. Meat and other protein foods Fatty cuts of meat. Ribs. Fried meat. Tomasa Blase. Sausage. Bologna and other processed lunch meats. Salami. Fatback.  Hotdogs. Bratwurst. Salted nuts and seeds. Canned beans with added salt. Canned or smoked fish. Whole eggs or egg yolks. Chicken or [place] with skin. Dairy Whole or 2% milk, cream, and half-and-half. Whole or full-fat cream cheese. Whole-fat or sweetened yogurt. Full-fat cheese. Nondairy creamers. Whipped toppings. Processed cheese and cheese spreads. Fats and oils Butter. Stick margarine. Lard. Shortening. Ghee. Bacon fat. Tropical oils, such as coconut, palm kernel, or palm oil. Seasoning and other foods Salted popcorn and pretzels. Onion salt, garlic salt, seasoned salt, table salt, and sea salt. Worcestershire  sauce. Tartar sauce. Barbecue sauce. Teriyaki sauce. Soy sauce, including reduced-sodium. Steak sauce. Canned and packaged gravies. Fish sauce. Oyster sauce. Cocktail sauce. Horseradish that you find on the shelf. Ketchup. Mustard. Meat flavorings and tenderizers. Bouillon cubes. Hot sauce and Tabasco sauce. Premade or packaged marinades. Premade or packaged taco seasonings. Relishes. Regular salad dressings. Where to find more information:  National Heart, Lung, and [place]: https://wilson-eaton.com/  American Heart Association: www.heart.org Summary  The DASH eating plan is a healthy eating plan that has been shown to reduce high blood pressure (hypertension). It may also reduce your risk for type 2 diabetes, heart disease, and stroke.  With the DASH eating plan, you should limit salt (sodium) intake to 2,300 mg a day. If you have hypertension, you may need to reduce your sodium intake to 1,500 mg a day.  When on the DASH eating plan, aim to eat more fresh fruits and vegetables, whole grains, lean proteins, low-fat dairy, and heart-healthy fats.  Work with your health care provider or diet and nutrition specialist (dietitian) to adjust your eating plan to your individual calorie needs. This information is not intended to replace advice given to you by your health care provider. Make sure you discuss any questions you have with your health care provider. Document Revised: 11/20/2017 Document Reviewed: 12/01/2016 Elsevier Patient Education  2020 [employer].

## 2020-05-03 ENCOUNTER — Other Ambulatory Visit: Payer: Self-pay | Admitting: Internal Medicine

## 2020-05-24 ENCOUNTER — Encounter: Payer: Managed Care, Other (non HMO) | Admitting: Internal Medicine

## 2020-06-27 ENCOUNTER — Other Ambulatory Visit: Payer: Self-pay | Admitting: Internal Medicine

## 2020-07-02 ENCOUNTER — Encounter (INDEPENDENT_AMBULATORY_CARE_PROVIDER_SITE_OTHER): Payer: Self-pay

## 2020-07-18 ENCOUNTER — Ambulatory Visit (INDEPENDENT_AMBULATORY_CARE_PROVIDER_SITE_OTHER): Payer: Managed Care, Other (non HMO) | Admitting: Bariatrics

## 2020-07-19 ENCOUNTER — Ambulatory Visit: Payer: Managed Care, Other (non HMO) | Admitting: Cardiovascular Disease

## 2020-08-01 ENCOUNTER — Ambulatory Visit (INDEPENDENT_AMBULATORY_CARE_PROVIDER_SITE_OTHER): Payer: Managed Care, Other (non HMO) | Admitting: Bariatrics

## 2020-08-01 ENCOUNTER — Other Ambulatory Visit: Payer: Self-pay | Admitting: Internal Medicine

## 2020-08-13 ENCOUNTER — Ambulatory Visit (INDEPENDENT_AMBULATORY_CARE_PROVIDER_SITE_OTHER): Payer: Managed Care, Other (non HMO) | Admitting: Internal Medicine

## 2020-08-13 ENCOUNTER — Other Ambulatory Visit: Payer: Self-pay

## 2020-08-13 ENCOUNTER — Encounter: Payer: Self-pay | Admitting: Internal Medicine

## 2020-08-13 VITALS — BP 122/76 | HR 75 | Temp 97.3°F | Ht 75.0 in | Wt 296.0 lb

## 2020-08-13 DIAGNOSIS — Z0000 Encounter for general adult medical examination without abnormal findings: Secondary | ICD-10-CM | POA: Diagnosis not present

## 2020-08-13 DIAGNOSIS — Z6837 Body mass index (BMI) 37.0-37.9, adult: Secondary | ICD-10-CM

## 2020-08-13 DIAGNOSIS — E1165 Type 2 diabetes mellitus with hyperglycemia: Secondary | ICD-10-CM | POA: Diagnosis not present

## 2020-08-13 DIAGNOSIS — R430 Anosmia: Secondary | ICD-10-CM

## 2020-08-13 DIAGNOSIS — I119 Hypertensive heart disease without heart failure: Secondary | ICD-10-CM

## 2020-08-13 LAB — POCT URINALYSIS DIPSTICK
Blood, UA: NEGATIVE
Glucose, UA: NEGATIVE
Ketones, UA: NEGATIVE
Leukocytes, UA: NEGATIVE
Nitrite, UA: NEGATIVE
Protein, UA: POSITIVE — AB
Spec Grav, UA: >=1.03 — AB (ref 1.010–1.025)
Urobilinogen, UA: 0.2 E.U./dL
pH, UA: 6 (ref 5.0–8.0)

## 2020-08-13 LAB — POCT UA - MICROALBUMIN
Creatinine, POC: 300 mg/dL
Microalbumin Ur, POC: 150 mg/L

## 2020-08-13 NOTE — Progress Notes (Signed)
I,Katawbba Wiggins,acting as a Education administrator for [name], MD.,have documented all relevant documentation on the behalf of [name], MD,as directed by  [name], MD while in the presence of [name], MD.  This visit occurred during the SARS-CoV-2 public health emergency.  Safety protocols were in place, including screening questions prior to the visit, additional usage of staff PPE, and extensive cleaning of exam room while observing appropriate contact time as indicated for disinfecting solutions.  Subjective:     Patient ID: [name] Hammond , male    DOB: 1961/09/02 , 59 y.o.   MRN: [id number]   Chief Complaint  Patient presents with  . Annual Exam  . Diabetes  . Hypertension    HPI  He is here today for a full physical examination.  He is followed by Urology for his prostate exams.  Diabetes He presents for his follow-up diabetic visit. He has type 2 diabetes mellitus. There are no hypoglycemic associated symptoms. Pertinent negatives for diabetes include no blurred vision and no chest pain. There are no hypoglycemic complications. Diabetic complications include heart disease. Risk factors for coronary artery disease include diabetes mellitus, dyslipidemia, male sex, hypertension, obesity and sedentary lifestyle. He is following a diabetic diet. He has had a previous visit with a dietitian. He participates in exercise intermittently. His breakfast blood glucose is taken between 8-9 am. His breakfast blood glucose range is generally 90-110 mg/dl. An ACE inhibitor/angiotensin II receptor blocker is being taken. He does not see a podiatrist.Eye exam is current.  Hypertension This is a chronic problem. The current episode started more than 1 year ago. The problem has been gradually improving since onset. The problem is controlled. Pertinent negatives include no blurred vision, chest pain, palpitations or shortness of breath. Risk factors for coronary artery disease include  diabetes mellitus, dyslipidemia, obesity and sedentary lifestyle. The current treatment provides moderate improvement. Compliance problems include exercise.  Hypertensive end-organ damage includes CAD/MI.     Past Medical History:  Diagnosis Date  . Anxiety   . Diabetes mellitus without complication ([place])    type 2   . Hypertension   . Hypothyroid   . Obstructive sleep apnea    CPAP  . Pure hypercholesterolemia   . PVD (peripheral vascular disease) ([place])   . Testicular hypofunction   . Vitamin D deficiency      Family History  Problem Relation Age of Onset  . Diabetes type II Mother   . Thyroid disease Mother   . Cancer - Other Father        Liver  . Hypertension Father   . Heart disease Father        Bypass surgery  . Thyroid disease Sister   . Diabetes type II Brother   . Diabetes type II Brother      Current Outpatient Medications:  .  aspirin EC 81 MG tablet, Take 81 mg by mouth daily., Disp: , Rfl:  .  atorvastatin (LIPITOR) 80 MG tablet, Take 1 tablet (80 mg total) by mouth at bedtime., Disp: 90 tablet, Rfl: 2 .  fluticasone (FLONASE) 50 MCG/ACT nasal spray, SHAKE LIQUID AND USE 1 SPRAY IN EACH NOSTRIL DAILY, Disp: 16 g, Rfl: 2 .  KOMBIGLYZE XR 04-999 MG TB24, TAKE 1 TABLET BY MOUTH EVERY DAY WITH THE EVENING MEAL, Disp: 90 tablet, Rfl: 1 .  levothyroxine (SYNTHROID) 100 MCG tablet, TAKE 1 TABLET(100 MCG) BY MOUTH DAILY, Disp: 90 tablet, Rfl: 0 .  lisinopril-hydrochlorothiazide (ZESTORETIC) 20-12.5 MG  tablet, TAKE 1 TABLET BY MOUTH DAILY, Disp: 90 tablet, Rfl: 1 .  metoprolol tartrate (LOPRESSOR) 25 MG tablet, TAKE 1/2 TABLET(12.5 MG) BY MOUTH TWICE DAILY, Disp: 90 tablet, Rfl: 1 .  sertraline (ZOLOFT) 50 MG tablet, TAKE 1 TABLET(50 MG) BY MOUTH DAILY, Disp: 90 tablet, Rfl: 2   No Known Allergies   Men's preventive visit. Patient Health Questionnaire (PHQ-2) is    Office Visit from 10/04/2019 in [facility]  PHQ-2 Total Score 0    .  Patient is on a healthy diet. Marital status: Married. Relevant history for alcohol use is:  Social History   Substance and Sexual Activity  Alcohol Use No  . Relevant history for tobacco use is:  Social History   Tobacco Use  Smoking Status Never Smoker  Smokeless Tobacco Never Used  .   Review of Systems  Constitutional: Negative.   HENT: Negative.        He c/o lack of smell. He states he feels that his sx have been long-standing. Denies h/o recent known COVID infection. Denies loss of taste. Feels as if something is stuck in his nose. Wants to be referred to ENT  Eyes: Negative.  Negative for blurred vision.  Respiratory: Negative.  Negative for shortness of breath.   Cardiovascular: Negative.  Negative for chest pain and palpitations.  Gastrointestinal: Negative.   Endocrine: Negative.   Genitourinary: Negative.   Musculoskeletal: Negative.   Skin: Negative.   Allergic/Immunologic: Negative.   Neurological: Negative.   Hematological: Negative.   Psychiatric/Behavioral: Negative.      Today's Vitals   08/13/20 1155  BP: 122/76  Pulse: 75  Temp: (!) 97.3 F (36.3 C)  TempSrc: Oral  Weight: 296 lb (134.3 kg)  Height: 6' 3"  (1.905 m)   Body mass index is 37 kg/m.  Wt Readings from Last 3 Encounters:  08/13/20 296 lb (134.3 kg)  04/12/20 (!) 303 lb (137.4 kg)  01/04/20 300 lb (136.1 kg)   Objective:  Physical Exam Vitals and nursing note reviewed.  Constitutional:      Appearance: Normal appearance. He is obese.  HENT:     Head: Normocephalic and atraumatic.     Right Ear: Tympanic membrane, ear canal and external ear normal.     Left Ear: Tympanic membrane, ear canal and external ear normal.     Nose:     Comments: Deferred, masked    Mouth/Throat:     Comments: Deferred, masked Eyes:     Extraocular Movements: Extraocular movements intact.     Conjunctiva/sclera: Conjunctivae normal.     Pupils: Pupils are equal, round, and reactive to light.   Cardiovascular:     Rate and Rhythm: Normal rate and regular rhythm.     Pulses: Normal pulses.          Dorsalis pedis pulses are 2+ on the right side and 2+ on the left side.     Heart sounds: Normal heart sounds.  Pulmonary:     Effort: Pulmonary effort is normal.     Breath sounds: Normal breath sounds.  Chest:     Breasts:        Right: Normal. No swelling, bleeding, inverted nipple, mass or nipple discharge.        Left: Normal. No swelling, bleeding, inverted nipple, mass or nipple discharge.     Comments: Healed surgical scar Abdominal:     General: Bowel sounds are normal.     Palpations: Abdomen is soft.  Comments: Rounded, soft. Difficult to assess organomegaly  Genitourinary:    Comments: Deferrd, masked Musculoskeletal:        General: Normal range of motion.     Cervical back: Normal range of motion and neck supple.  Feet:     Right foot:     Protective Sensation: 5 sites tested. 5 sites sensed.     Skin integrity: Callus and dry skin present.     Toenail Condition: Right toenails are normal.     Left foot:     Protective Sensation: 5 sites tested. 5 sites sensed.     Skin integrity: Callus and dry skin present.     Toenail Condition: Left toenails are normal.  Skin:    General: Skin is warm.  Neurological:     General: No focal deficit present.     Mental Status: He is alert and oriented to person, place, and time.  Psychiatric:        Mood and Affect: Mood normal.        Behavior: Behavior normal.         Assessment And Plan:    1. Routine general medical examination at health care facility Comments: A full exam was performed. DRE deferred, per his request. He is encouraged to schedule appt with Urology. I will check PSA today. PATIENT IS ADVISED TO GET 30-45 MINUTES REGULAR EXERCISE NO LESS THAN FOUR TO FIVE DAYS PER WEEK - BOTH WEIGHTBEARING EXERCISES AND AEROBIC ARE RECOMMENDED.  PATIENT IS ADVISED TO FOLLOW A HEALTHY DIET WITH AT LEAST SIX  FRUITS/VEGGIES PER DAY, DECREASE INTAKE OF RED MEAT, AND TO INCREASE FISH INTAKE TO TWO DAYS PER WEEK.  MEATS/FISH SHOULD NOT BE FRIED, BAKED OR BROILED IS PREFERABLE.  I SUGGEST WEARING SPF 50 SUNSCREEN ON EXPOSED PARTS AND ESPECIALLY WHEN IN THE DIRECT SUNLIGHT FOR AN EXTENDED PERIOD OF TIME.  PLEASE AVOID FAST FOOD RESTAURANTS AND INCREASE YOUR WATER INTAKE. - CBC - Hemoglobin A1c - CMP14+EGFR - VITAMIN D 25 Hydroxy (Vit-D Deficiency, Fractures) - PSA - Lipid panel  2. Uncontrolled type 2 diabetes mellitus with hyperglycemia ([place]) Comments: Diabetic foot exam was performed. I DISCUSSED WITH THE PATIENT AT LENGTH REGARDING THE GOALS OF GLYCEMIC CONTROL AND POSSIBLE LONG-TERM COMPLICATIONS.  I  ALSO STRESSED THE IMPORTANCE OF COMPLIANCE WITH HOME GLUCOSE MONITORING, DIETARY RESTRICTIONS INCLUDING AVOIDANCE OF SUGARY DRINKS/PROCESSED FOODS,  ALONG WITH REGULAR EXERCISE.  I  ALSO STRESSED THE IMPORTANCE OF ANNUAL EYE EXAMS, SELF FOOT CARE AND COMPLIANCE WITH OFFICE VISITS.  - POCT Urinalysis Dipstick (81002) - POCT UA - Microalbumin  3. Benign hypertensive heart disease without heart failure Comments: Chronic, well controlled. He will continue with current meds. EKG performed, NSR w/o acute changes. He will rto in six months for re-evaluation.   - EKG 12-Lead  4. Class 2 severe obesity due to excess calories with serious comorbidity and body mass index (BMI) of 37.0 to 37.9 in adult [facility])  He was congratulated on 7 pound weight loss since last appt. He was referred to [facility] clinic by Dr. Oval Linsey but was advised that they are not taking any new patients at this time. He agrees to referral to [facility], he prefers to go to [facility] location. He is also encouraged to aim for at least 150 minutes of exercise per week.  - Ambulatory referral to Internal Medicine  5. Loss of smell  He agrees to both COVID testing and ENT referral.  - Novel Coronavirus, NAA ([facility]) -  Ambulatory referral to ENT     Patient was given opportunity to ask questions. Patient verbalized understanding of the plan and was able to repeat key elements of the plan. All questions were answered to their satisfaction.   [name], MD   I, [name], MD, have reviewed all documentation for this visit. The documentation on 08/13/20 for the exam, diagnosis, procedures, and orders are all accurate and complete.  THE PATIENT IS ENCOURAGED TO PRACTICE SOCIAL DISTANCING DUE TO THE COVID-19 PANDEMIC.

## 2020-08-13 NOTE — Patient Instructions (Signed)

## 2020-08-14 LAB — CMP14+EGFR
ALT: 26 IU/L (ref 0–44)
AST: 27 IU/L (ref 0–40)
Albumin/Globulin Ratio: 1.3 (ref 1.2–2.2)
Albumin: 4.3 g/dL (ref 3.8–4.9)
Alkaline Phosphatase: 115 IU/L (ref 48–121)
BUN/Creatinine Ratio: 14 (ref 9–20)
BUN: 12 mg/dL (ref 6–24)
Bilirubin Total: 0.4 mg/dL (ref 0.0–1.2)
CO2: 24 mmol/L (ref 20–29)
Calcium: 9 mg/dL (ref 8.7–10.2)
Chloride: 100 mmol/L (ref 96–106)
Creatinine, Ser: 0.87 mg/dL (ref 0.76–1.27)
GFR calc Af Amer: 109 mL/min/{1.73_m2} (ref >59)
GFR calc non Af Amer: 94 mL/min/{1.73_m2} (ref >59)
Globulin, Total: 3.4 g/dL (ref 1.5–4.5)
Glucose: 114 mg/dL — ABNORMAL HIGH (ref 65–99)
Potassium: 4.2 mmol/L (ref 3.5–5.2)
Sodium: 137 mmol/L (ref 134–144)
Total Protein: 7.7 g/dL (ref 6.0–8.5)

## 2020-08-14 LAB — LIPID PANEL
Chol/HDL Ratio: 3.1 ratio (ref 0.0–5.0)
Cholesterol, Total: 121 mg/dL (ref 100–199)
HDL: 39 mg/dL — ABNORMAL LOW (ref >39)
LDL Chol Calc (NIH): 68 mg/dL (ref 0–99)
Triglycerides: 65 mg/dL (ref 0–149)
VLDL Cholesterol Cal: 14 mg/dL (ref 5–40)

## 2020-08-14 LAB — NOVEL CORONAVIRUS, NAA: SARS-CoV-2, NAA: NOT DETECTED

## 2020-08-14 LAB — VITAMIN D 25 HYDROXY (VIT D DEFICIENCY, FRACTURES): Vit D, 25-Hydroxy: 35.3 ng/mL (ref 30.0–100.0)

## 2020-08-14 LAB — SARS-COV-2, NAA 2 DAY TAT

## 2020-08-20 ENCOUNTER — Telehealth: Payer: Self-pay

## 2020-08-20 NOTE — Progress Notes (Signed)
No he didn't I left him a detailed message.

## 2020-08-20 NOTE — Telephone Encounter (Signed)
I called the pt to let him know that Dr. Allyne Gee needed him to stop by the lab the day of his visit and to see when he can come back to have his labs drawn.

## 2020-08-20 NOTE — Telephone Encounter (Signed)
-----   Message from [name], MD sent at 08/19/2020 10:11 PM EDT ----- Were labs drawn?  ----- Message ----- From: SYSTEM Sent: 08/18/2020  12:12 AM EDT To: [name], MD

## 2020-08-29 ENCOUNTER — Encounter: Payer: Self-pay | Admitting: Cardiovascular Disease

## 2020-08-29 ENCOUNTER — Ambulatory Visit (INDEPENDENT_AMBULATORY_CARE_PROVIDER_SITE_OTHER): Payer: Managed Care, Other (non HMO) | Admitting: Cardiovascular Disease

## 2020-08-29 ENCOUNTER — Other Ambulatory Visit: Payer: Self-pay

## 2020-08-29 VITALS — BP 120/70 | HR 65 | Ht 75.0 in | Wt 297.0 lb

## 2020-08-29 DIAGNOSIS — I119 Hypertensive heart disease without heart failure: Secondary | ICD-10-CM | POA: Diagnosis not present

## 2020-08-29 DIAGNOSIS — I25810 Atherosclerosis of coronary artery bypass graft(s) without angina pectoris: Secondary | ICD-10-CM | POA: Diagnosis not present

## 2020-08-29 DIAGNOSIS — Z951 Presence of aortocoronary bypass graft: Secondary | ICD-10-CM

## 2020-08-29 DIAGNOSIS — Z6837 Body mass index (BMI) 37.0-37.9, adult: Secondary | ICD-10-CM

## 2020-08-29 NOTE — Progress Notes (Signed)
Cardiology Office Note   Date:  08/29/2020   ID:  [name] Hammond, DOB 1961/12/05, MRN [id number]  PCP:  [name], MD  Cardiologist:   Chilton Si, MD   No chief complaint on file.    History of Present Illness: [name] Hammond is a 59 y.o. male with CAD s/p CABG, hypertension, hypothyroidism and diabetes who is here for follow up.  He was first seen 03/2017 with chest pain.  He was referred for ETT 04/2017 that was positive for ischemia.  He then had a LHC with obstructive disease in the LAD and LCx territories.  Echo revealed LVEF 60-65% and mild LVH.  He underwent CABG (LIMA-->LAD, SVG-->D, SVG-->OM) on 05/13/17.  His surgery was complicated by a hematoma and bleeding from the leg vein graft removal site.  His procedure was otherwise uncomplicated. He followed up with Azalee Course, PA, on 05/2017. He has participated in cardiac rehabilitation and is somewhat frustrated by the fact that he cannot work out more strenuously yet.   He enjoys the cardio but wants to start lifting weights.  He denies chest pain or shortness of breath.  He continues to some numbness in his chest around the incision site. He sometimes also has a prickling sensation. He has no lower extremity edema, orthopnea, or PND. 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  He followed up with Dr. Allyne Gee on 12/2019 and his blood pressure was 142/96.  At his last appointment his blood pressure was elevated. He wanted to work on diet and exercise before making any medication changes. At his last appointment with Dr. Allyne Gee his blood pressure was better controlled.  Lately he has been feeling well. He isn't getting much formal exercise.  He has no LE edema, orthopnea or PND.  He denies shortness of breath or chest pain.  He continues to have some chest tightness.  He notices it when he is anxious.  He has no exertional chest pain.     Past Medical History:  Diagnosis Date  . Anxiety    . Diabetes mellitus without complication (HCC)    type 2   . Hypertension   . Hypothyroid   . Obstructive sleep apnea    CPAP  . Pure hypercholesterolemia   . PVD (peripheral vascular disease) (HCC)   . Testicular hypofunction   . Vitamin D deficiency     Past Surgical History:  Procedure Laterality Date  . COLONOSCOPY WITH PROPOFOL N/A 11/24/2019   Procedure: COLONOSCOPY WITH PROPOFOL;  Surgeon: Charna Elizabeth, MD;  Location: [facility];  Service: Endoscopy;  Laterality: N/A;  . CORONARY ARTERY BYPASS GRAFT N/A 05/12/2017   Procedure: CORONARY ARTERY BYPASS GRAFTING (CABG) x three , using left inernal mammary artery and left leg     greater saphenous vein harvested endoscopically;  Surgeon: Kerin Perna, MD;  Location: [facility] OR;  Service: Open Heart Surgery;  Laterality: N/A;  . COSMETIC SURGERY    . INTRAVASCULAR ULTRASOUND/IVUS N/A 05/08/2017   Procedure: Intravascular Ultrasound/IVUS;  Surgeon: Kathleene Hazel, MD;  Location: [facility] INVASIVE CV LAB;  Service: Cardiovascular;  Laterality: N/A;  . LEFT HEART CATH AND CORONARY ANGIOGRAPHY N/A 05/08/2017   Procedure: Left Heart Cath and Coronary Angiography;  Surgeon: Kathleene Hazel, MD;  Location: [facility] INVASIVE CV LAB;  Service: Cardiovascular;  Laterality: N/A;  . TEE WITHOUT CARDIOVERSION N/A 05/12/2017   Procedure: TRANSESOPHAGEAL ECHOCARDIOGRAM (TEE);  Surgeon: Donata Clay, Theron Arista, MD;  Location: [facility] OR;  Service: Open Heart Surgery;  Laterality: N/A;     Current Outpatient Medications  Medication Sig Dispense Refill  . aspirin EC 81 MG tablet Take 81 mg by mouth daily.    Marland Kitchen atorvastatin (LIPITOR) 80 MG tablet Take 1 tablet (80 mg total) by mouth at bedtime. 90 tablet 2  . fluticasone (FLONASE) 50 MCG/ACT nasal spray SHAKE LIQUID AND USE 1 SPRAY IN EACH NOSTRIL DAILY 16 g 2  . KOMBIGLYZE XR 04-999 MG TB24 TAKE 1 TABLET BY MOUTH EVERY DAY WITH THE EVENING MEAL 90 tablet 1  . levothyroxine (SYNTHROID) 100 MCG tablet TAKE 1  TABLET(100 MCG) BY MOUTH DAILY 90 tablet 0  . lisinopril-hydrochlorothiazide (ZESTORETIC) 20-12.5 MG tablet TAKE 1 TABLET BY MOUTH DAILY 90 tablet 1  . metoprolol tartrate (LOPRESSOR) 25 MG tablet TAKE 1/2 TABLET(12.5 MG) BY MOUTH TWICE DAILY 90 tablet 1  . sertraline (ZOLOFT) 50 MG tablet TAKE 1 TABLET(50 MG) BY MOUTH DAILY 90 tablet 2   No current facility-administered medications for this visit.    Allergies:   Patient has no known allergies.    Social History:  The patient  reports that he has never smoked. He has never used smokeless tobacco. He reports that he does not drink alcohol and does not use drugs.   Family History:  The patient's family history includes Cancer - Other in his father; Diabetes type II in his brother, brother, and mother; Heart disease in his father; Hypertension in his father; Thyroid disease in his mother and sister.    ROS:  Please see the history of present illness.   Otherwise, review of systems are positive for none.   All other systems are reviewed and negative.    PHYSICAL EXAM: VS:  BP 120/70   Pulse 65   Ht 6\' 3"  (1.905 m)   Wt 297 lb (134.7 kg)   SpO2 98%   BMI 37.12 kg/m  , BMI Body mass index is 37.12 kg/m. GENERAL:  Well appearing HEENT: Pupils equal round and reactive, fundi not visualized, oral mucosa unremarkable NECK:  No jugular venous distention, waveform within normal limits, carotid upstroke brisk and symmetric, no bruits LUNGS:  Clear to auscultation bilaterally HEART:  RRR.  PMI not displaced or sustained,S1 and S2 within normal limits, no S3, no S4, no clicks, no rubs, no murmurs ABD:  Flat, positive bowel sounds normal in frequency in pitch, no bruits, no rebound, no guarding, no midline pulsatile mass, no hepatomegaly, no splenomegaly EXT:  2 plus pulses throughout, no edema, no cyanosis no clubbing SKIN:  No rashes no nodules NEURO:  Cranial nerves II through XII grossly intact, motor grossly intact throughout PSYCH:   Cognitively intact, oriented to person place and time  EKG:  EKG is ordered today. The ekg ordered 04/13/17 demonstrates sinus rhythm.  Rate 64 bpm. 04/12/20: Sinus rhythm.  Rate 69 bpm.  Echo 05/09/17:  Study Conclusions  - Left ventricle: The cavity size was normal. Wall thickness was   increased in a pattern of mild LVH. Systolic function was normal.   The estimated ejection fraction was in the range of 60% to 65%.   Wall motion was normal; there were no regional wall motion   abnormalities. Left ventricular diastolic function parameters   were normal. - Tricuspid valve: There was trivial regurgitation. - Pulmonary arteries: Systolic pressure could not be accurately   estimated. - Pericardium, extracardiac: There was no pericardial effusion.  Impressions:  - Mild LVH with LVEF  60-65% and normal diastolic function. Trivial   tricuspid regurgitation.  LHC 05/08/17:  Mid RCA lesion, 10 %stenosed.  Ost LM to LM lesion, 50 %stenosed.  Ost Cx to Prox Cx lesion, 60 %stenosed.  Ost LAD to Prox LAD lesion, 90 %stenosed.  LM lesion, 50 %stenosed.  3rd Mrg lesion, 30 %stenosed.  The left ventricular ejection fraction is greater than 65% by visual estimate.  The left ventricular systolic function is normal.  LV end diastolic pressure is normal.  There is no mitral valve regurgitation.  Ost 1st Diag to 1st Diag lesion, 50 %stenosed.   1. Severe ostial LAD stenosis with moderate stenosis in the distal left main artery and ostial Circumflex artery. This is confirmed with IVUS imaging.  2. Mild plaque in the mid RCA and third OM branch 3. Moderate stenosis in the first diagonal branch 4. Normal LV systolic function   Recent Labs: 01/04/2020: TSH 2.620 08/13/2020: ALT 26; BUN 12; Creatinine, Ser 0.87; Potassium 4.2; Sodium 137   2/18:   TSH 2.3.  total cholesterol 139, triglyerides 73, HDL 39, LDL 85  WBC 8.3, hemoglon 10.2, hematcrit 42.9, atelets 278 Hemoglobin A1c  6.5  Sodium 139, potassium 4.1, BUN 10, creatinine 0.86  AST 30, ALT 37  Lipid Panel    Component Value Date/Time   CHOL 121 08/13/2020 1515   TRIG 65 08/13/2020 1515   HDL 39 (L) 08/13/2020 1515   CHOLHDL 3.1 08/13/2020 1515   CHOLHDL 3.4 05/09/2017 0254   VLDL 19 05/09/2017 0254   LDLCALC 68 08/13/2020 1515      Wt Readings from Last 3 Encounters:  08/29/20 297 lb (134.7 kg)  08/13/20 296 lb (134.3 kg)  04/12/20 (!) 303 lb (137.4 kg)      ASSESSMENT AND PLAN:  # CAD s/p CABG: # Hyperlipidemia: Mr. Schertzer is doing well. His chest pain is non-exertional.  Continue aspirin, atorvastatin, and metoprolol.  Lipids are at goal.  # Hypertension:  Blood pressure is well-controlled.  Continue lisinopril/HCTZ and metoprolol.  He will continue to work on diet and exercise.  He was previously referred to the healthy weight and wellness clinic and deferred it due to COVID-19.  He will call them back to reschedule.  # Morbid obesity: Healthy weight and wellness clinic.  He will follow up with them.   All medicines are reviewed at length with the patient today.  The patient does not have concerns regarding medicines.  The following changes have been made:  no change  Labs/ tests ordered today include:   No orders of the defined types were placed in this encounter.    Disposition:   FU with Harutyun Monteverde C. Duke Salvia, MD, [facility] in 3 months.     Signed, Harlea Goetzinger C. Duke Salvia, MD, [facility]  08/29/2020 5:14 PM     [facility]

## 2020-08-29 NOTE — Patient Instructions (Signed)
Medication Instructions:  Your physician recommends that you continue on your current medications as directed. Please refer to the Current Medication list given to you today.  *If you need a refill on your cardiac medications before your next appointment, please call your pharmacy*  Lab Work: NONE  Testing/Procedures: NONE  Follow-Up: At [facility], you and your health needs are our priority.  As part of our continuing mission to provide you with exceptional heart care, we have created designated Provider Care Teams.  These Care Teams include your primary Cardiologist (physician) and Advanced Practice Providers (APPs -  Physician Assistants and Nurse Practitioners) who all work together to provide you with the care you need, when you need it.  We recommend signing up for the patient portal called "MyChart".  Sign up information is provided on this After Visit Summary.  MyChart is used to connect with patients for Virtual Visits (Telemedicine).  Patients are able to view lab/test results, encounter notes, upcoming appointments, etc.  Non-urgent messages can be sent to your provider as well.   To learn more about what you can do with MyChart, go to [email or web address].    Your next appointment:   12 month(s)  You will receive a reminder letter in the mail two months in advance. If you don't receive a letter, please call our office to schedule the follow-up appointment.  The format for your next appointment:   In Person  Provider:   You may see DR [employer] or one of the following Advanced Practice Providers on your designated Care Team:    Luke Kilroy, PA-C  Callie Goodrich, PA-C  Jesse Cleaver, FNP     

## 2020-09-21 ENCOUNTER — Telehealth: Payer: Self-pay

## 2020-09-21 NOTE — Telephone Encounter (Signed)
I left the pt a message that Dr. Allyne Gee wanted to know if the pt can come on Monday to do the a1c lab test that still needed to be done and that Leola Brazil, NP that he saw yesterday wanted to know if it's ok for the pt to take prednisone and that Dr. Allyne Gee needed the a1c results so that she can let the provider know.

## 2020-09-25 LAB — CBC
Hematocrit: 45.1 % (ref 37.5–51.0)
Hemoglobin: 15.1 g/dL (ref 13.0–17.7)
MCH: 29.3 pg (ref 26.6–33.0)
MCHC: 33.5 g/dL (ref 31.5–35.7)
MCV: 88 fL (ref 79–97)
Platelets: 294 10*3/uL (ref 150–450)
RBC: 5.15 x10E6/uL (ref 4.14–5.80)
RDW: 13.1 % (ref 11.6–15.4)
WBC: 8.7 10*3/uL (ref 3.4–10.8)

## 2020-09-25 LAB — PSA: Prostate Specific Ag, Serum: 0.3 ng/mL (ref 0.0–4.0)

## 2020-09-25 LAB — HEMOGLOBIN A1C
Est. average glucose Bld gHb Est-mCnc: 146 mg/dL
Hgb A1c MFr Bld: 6.7 % — ABNORMAL HIGH (ref 4.8–5.6)

## 2020-10-01 ENCOUNTER — Telehealth: Payer: Self-pay

## 2020-10-01 NOTE — Telephone Encounter (Addendum)
I returned a call to [name] at [facility] and notified her that Dr. Allyne Gee said that the pt can take the 60 mg of prednisone to taper over 9 days.

## 2020-10-08 ENCOUNTER — Other Ambulatory Visit: Payer: Self-pay | Admitting: Internal Medicine

## 2020-10-27 ENCOUNTER — Other Ambulatory Visit: Payer: Self-pay | Admitting: Internal Medicine

## 2020-11-29 NOTE — H&P (Signed)
HPI:   Chief Complaint  Patient presents with  . Sinus polyps  Patient is here for blockage on right side for years.   [name] Hammond is a 59 y.o. male who presents as a new patient for nasal obstruction. Course is worsening over the past few years. Problem is primarily right sided. He has not been treated for recurrent sinus infections. He wonders if he has nasal polyps; has never been diagnosed with this. He has tried multiple different types of nasal sprays including saline and Flonase without much benefit. No imaging of the sinuses.  Denies fever, mucopurulent rhinorrhea, postnasal drip, cough, hoarseness, otalgia/otorrhea, tinnitus, vertigo or hearing loss.  Formal allergy testing in the past revealed positivity to grass and dust. No aspirin sensitivity. Asthma as a child but not an adult. He has severe obstructive sleep apnea; previously was wearing CPAP but currently is not.   He is diabetic; taking a combination gliptin and Metformin. History of CAD and CABG in 2018.  Non-smoker.  PMH/Meds/All/SocHx/FamHx/ROS:   Past Medical History:  Diagnosis Date  . Anxiety  . Diabetes mellitus (HCC)  . High cholesterol  . Hypertension  . Hypothyroid   Past Surgical History:  Procedure Laterality Date  . COLONOSCOPY  . triple bypass surgery   No family history of bleeding disorders, wound healing problems or difficulty with anesthesia.   Social History   Socioeconomic History  . Marital status: Unknown  Spouse name: Not on file  . Number of children: Not on file  . Years of education: Not on file  . Highest education level: Not on file  Occupational History  . Not on file  Tobacco Use  . Smoking status: Never Smoker  . Smokeless tobacco: Never Used  Substance and Sexual Activity  . Alcohol use: Not Currently  . Drug use: Not on file  . Sexual activity: Not on file  Other Topics Concern  . Not on file  Social History Narrative  . Not on file   Social Determinants of  Health   Financial Resource Strain:  . Difficulty of Paying Living Expenses: Not on file  Food Insecurity:  . Worried About Programme researcher, broadcasting/film/video in the Last Year: Not on file  . Ran Out of Food in the Last Year: Not on file  Transportation Needs:  . Lack of Transportation (Medical): Not on file  . Lack of Transportation (Non-Medical): Not on file  Physical Activity:  . Days of Exercise per Week: Not on file  . Minutes of Exercise per Session: Not on file  Stress:  . Feeling of Stress : Not on file  Social Connections:  . Frequency of Communication with Friends and Family: Not on file  . Frequency of Social Gatherings with Friends and Family: Not on file  . Attends Religious Services: Not on file  . Active Member of Clubs or Organizations: Not on file  . Attends Banker Meetings: Not on file  . Marital Status: Not on file   Current Outpatient Medications:  . aspirin 81 MG EC tablet *ANTIPLATELET*, Take 81 mg by mouth., Disp: , Rfl:  . atorvastatin (LIPITOR) 10 MG tablet, Take 10 mg by mouth., Disp: , Rfl:  . clomiPHENE (CLOMID) 50 mg tablet, Take 25 mg by mouth., Disp: , Rfl:  . levothyroxine (SYNTHROID) 100 MCG tablet, TAKE 1 TABLET(100 MCG) BY MOUTH DAILY, Disp: , Rfl:  . lisinopril- hydrochlorothiazide (PRINZIDE,ZESTORETIC) 10-12.5 mg per tablet, Take by mouth., Disp: , Rfl:  . metoPROLOL tartrate (LOPRESSOR)  25 MG tablet, TAKE 1/2 TABLET(12.5 MG) BY MOUTH TWICE DAILY, Disp: , Rfl:  . SAXagliptin-metFORMIN ER (KOMBIGLYZE XR) 5-1,000 mg TM24 per tablet, TAKE 1 TABLET BY MOUTH EVERY DAY WITH THE EVENING MEAL, Disp: , Rfl:  . sertraline (ZOLOFT) 25 MG tablet, Take 25 mg by mouth., Disp: , Rfl:   A complete ROS was performed with pertinent positives/negatives noted in the HPI. The remainder of the ROS are negative.   Physical Exam:   BP 132/75  Pulse 65  Temp 96.8 F (36 C)  Ht 1.905 m (6\' 3" )  Wt 133.8 kg (295 lb)  BMI 36.87 kg/m   General Awake, at baseline  alertness during examination. Obese.  Eyes No scleral icterus or conjunctival hemorrhage. Globe position appears normal. EOMI.  Right Ear EAC patent, TM intact w/o inflammation. Middle ear well aerated.  Left Ear EAC patent, TM intact w/o inflammation. Middle ear well aerated.  Nose Polyps filling much of the right nasal passage. Minimal visualization of the right inferior turbinate. Possible polypoid lesion in left nasal passage at the middle turbinate. Unable to visualize nasopharynx.  Oral cavity No mucosal lesions or tumors seen. Tongue midline.  Oropharynx Symmetric tonsils.  Neck No abnormal cervical lymphadenopathy. No thyromegaly. No thyroid masses palpated.  Cardio-vascular No cyanosis.  Pulmonary No audible stridor. Breathing easily with no labor.  Neuro Symmetric facial movement.  Psychiatry Appropriate affect and mood for clinic visit.   Independent Review of Additional Tests or Records:  Medical records.   Procedures:  None  Impression & Plans:  [name] Hammond is a 59 y.o. male with nasal polyposis. Will have him medically cleared to take a nine-day taper of prednisone, 60mg  and continue the Flonase. Will have patient return in 2-3 weeks for maxillofacial CT scan.

## 2020-12-03 ENCOUNTER — Ambulatory Visit: Payer: Managed Care, Other (non HMO) | Admitting: Internal Medicine

## 2020-12-05 LAB — HM DIABETES EYE EXAM

## 2020-12-07 NOTE — Pre-Procedure Instructions (Signed)
DAVIN ARCHULETTA  12/07/2020      [facility] [facility] - [place], [place] - [address] MA[address] [place] [address] Phone: [phone] Fax: [phone]  [id number] - [place], [place] - [address] AT [facility] OF [address] [place] [address] Phone: [phone] Fax: [phone]    Your procedure is scheduled on---- 12./22/21  Report to [facility] Admitting at 830 A.M.  Call this number if you have problems the morning of surgery:  [phone]   Remember:  Do not eat or drink after midnight.  Yo   Take these medicines the morning of surgery with A SIP OF WATER ----ALL INHALERS,SYNTHROID,LOPRESSOR,ZOLOFT    Do not wear jewelry, make-up or nail polish.  Do not wear lotions, powders, or perfumes, or deodorant.  Do not shave 48 hours prior to surgery.  Men may shave face and neck.  Do not bring valuables to the hospital.  [facility] is not responsible for any belongings or valuables.  Contacts, dentures or bridgework may not be worn into surgery.  Leave your suitcase in the car.  After surgery it may be brought to your room.  For patients admitted to the hospital, discharge time will be determined by your treatment team.  Patients discharged the day of surgery will not be allowed to drive home.    Special instructions:  Do not take any aspirin,anti-inflammatories,vitamins,or herbal supplements 5-7 days prior to surgery.  Please read over the following fact sheets that you were given. Coughing and Deep Breathing [place] - Preparing for Surgery  Before surgery, you can play an important role.  Because skin is not sterile, your skin needs to be as free of germs as possible.  You can reduce the number of germs on you skin by washing with CHG (chlorahexidine gluconate) soap before surgery.  CHG is an antiseptic cleaner which kills germs and bonds with the skin to continue  killing germs even after washing.  Oral Hygiene is also important in reducing the risk of infection.  Remember to brush your teeth with your regular toothpaste the morning of surgery.  Please DO NOT use if you have an allergy to CHG or antibacterial soaps.  If your skin becomes reddened/irritated stop using the CHG and inform your nurse when you arrive at Short Stay.  Do not shave (including legs and underarms) for at least 48 hours prior to the first CHG shower.  You may shave your face.  Please follow these instructions carefully:   1.  Shower with CHG Soap the night before surgery and the morning of Surgery.  2.  If you choose to wash your hair, wash your hair first as usual with your normal shampoo.  3.  After you shampoo, rinse your hair and body thoroughly to remove the shampoo. 4.  Use CHG as you would any other liquid soap.  You can apply chg directly to the skin and wash gently with a      scrungie or washcloth.           5.  Apply the CHG Soap to your body ONLY FROM THE NECK DOWN.   Do not use on open wounds or open sores. Avoid contact with your eyes, ears, mouth and genitals (private parts).  Wash genitals (private parts) with your normal soap.  6.  Wash thoroughly, paying special attention to the area where your surgery  will be performed.  7.  Thoroughly rinse your body with warm water from the neck down.  8.  DO NOT shower/wash with your normal soap after using and rinsing off the CHG Soap.  9.  Pat yourself dry with a clean towel.            10.  Wear clean pajamas.            11.  Place clean sheets on your bed the night of your first shower and do not sleep with pets.  Day of Surgery  Do not apply any lotions/deoderants the morning of surgery.   Please wear clean clothes to the hospital/surgery center. Remember to brush your teeth with toothpaste.

## 2020-12-10 ENCOUNTER — Encounter (HOSPITAL_COMMUNITY): Payer: Self-pay

## 2020-12-10 ENCOUNTER — Telehealth: Payer: Self-pay | Admitting: Cardiovascular Disease

## 2020-12-10 ENCOUNTER — Other Ambulatory Visit: Payer: Self-pay

## 2020-12-10 ENCOUNTER — Encounter (HOSPITAL_COMMUNITY)
Admission: RE | Admit: 2020-12-10 | Discharge: 2020-12-10 | Disposition: A | Discharge status: Home / Self Care | Payer: Managed Care, Other (non HMO) | Source: Ambulatory Visit | Attending: Otolaryngology | Admitting: Otolaryngology

## 2020-12-10 ENCOUNTER — Other Ambulatory Visit (HOSPITAL_COMMUNITY)
Admission: RE | Admit: 2020-12-10 | Discharge: 2020-12-10 | Disposition: A | Discharge status: Home / Self Care | Payer: Managed Care, Other (non HMO) | Source: Ambulatory Visit | Attending: Otolaryngology | Admitting: Otolaryngology

## 2020-12-10 DIAGNOSIS — Z01812 Encounter for preprocedural laboratory examination: Secondary | ICD-10-CM | POA: Insufficient documentation

## 2020-12-10 DIAGNOSIS — Z20822 Contact with and (suspected) exposure to covid-19: Secondary | ICD-10-CM | POA: Insufficient documentation

## 2020-12-10 HISTORY — DX: Atherosclerotic heart disease of native coronary artery without angina pectoris: I25.10

## 2020-12-10 LAB — BASIC METABOLIC PANEL
Anion gap: 11 (ref 5–15)
BUN: 14 mg/dL (ref 6–20)
CO2: 26 mmol/L (ref 22–32)
Calcium: 9 mg/dL (ref 8.9–10.3)
Chloride: 101 mmol/L (ref 98–111)
Creatinine, Ser: 0.83 mg/dL (ref 0.61–1.24)
GFR, Estimated: >60 mL/min (ref >60)
Glucose, Bld: 164 mg/dL — ABNORMAL HIGH (ref 70–99)
Potassium: 4 mmol/L (ref 3.5–5.1)
Sodium: 138 mmol/L (ref 135–145)

## 2020-12-10 LAB — CBC
HCT: 46.3 % (ref 39.0–52.0)
Hemoglobin: 15.6 g/dL (ref 13.0–17.0)
MCH: 30.1 pg (ref 26.0–34.0)
MCHC: 33.7 g/dL (ref 30.0–36.0)
MCV: 89.2 fL (ref 80.0–100.0)
Platelets: 295 10*3/uL (ref 150–400)
RBC: 5.19 MIL/uL (ref 4.22–5.81)
RDW: 14.6 % (ref 11.5–15.5)
WBC: 14.4 10*3/uL — ABNORMAL HIGH (ref 4.0–10.5)
nRBC: 0 % (ref 0.0–0.2)

## 2020-12-10 LAB — SARS CORONAVIRUS 2 (TAT 6-24 HRS): SARS Coronavirus 2: NEGATIVE

## 2020-12-10 LAB — GLUCOSE, CAPILLARY: Glucose-Capillary: 159 mg/dL — ABNORMAL HIGH (ref 70–99)

## 2020-12-10 NOTE — Progress Notes (Signed)
Anesthesia Chart Review:  Case: [id number] Date/Time: 12/12/20 1015   Procedure: ENDOSCOPIC SINUS SURGERY (Bilateral )   Anesthesia type: General   Pre-op diagnosis:      Chronic pansinusitis     Nasal polyposis   [facility]: [facility] ROOM 09 / [facility]   Surgeons: Serena Colonel, MD      DISCUSSION: Patient is a 59 year old male scheduled for the above procedure.  History includes never smoker, CAD (s/p CABG: LIMA-->LAD, SVG-->D, SVG-->OM 05/12/17)), HTN, DM2, hypothyroidism, anxiety, PVD, OSA (CPAP). BMI is consistent with obesity.  Last visit with cardiologist Dr. Duke Salvia was on 08/29/20. By notes, overall seemed to be doing well although primary care following elevated BP and he also continued to have some non-exertional chest tightness which he noticed when he's anxious. She did not order any additional cardiac testing. He was encouraged to follow through with [facility] evaluation which had been postponed initially due to COVID-19 pandemic.  She had advised 3 month follow-up, which has not happened yet, although no chest pain or SOB documented for PAT RN note.   Preoperative cardiology input outlined by Edd Fabian, NP on 12/11/20. He wrote, "...Given past medical history and time since last visit, based on ACC/AHA guidelines, EPHRAM KORNEGAY would be at acceptable risk for the planned procedure without further cardiovascular testing.   Patient was advised that if he develops new symptoms prior to surgery to contact our office to arrange a follow-up appointment.  He verbalized understanding."   WBC 14.4K. No fever or cough per PAT RN interview. Labs routed to Dr. Pollyann Kennedy and message left for his medical assistant regarding WBC results. 12/10/20 presurgical COVID-19 test was negative. Anesthesia team to evaluate on the day of surgery.    VS: BP (!) 170/93   Pulse 68   Temp 36.4 C (Oral)   Resp 17   Ht 6\' 3"  (1.905 m)   Wt 133.4 kg   SpO2 99%   BMI 36.75 kg/m     PROVIDERS: , MD is PCP  [name], MD is cardiologist    LABS: Preoperative labs noted. See DISCUSSION.  A1c 6.7% 09/24/20.  (all labs ordered are listed, but only abnormal results are displayed)  Labs Reviewed  GLUCOSE, CAPILLARY - Abnormal; Notable for the following components:      Result Value   Glucose-Capillary 159 (*)    All other components within normal limits  CBC - Abnormal; Notable for the following components:   WBC 14.4 (*)    All other components within normal limits  BASIC METABOLIC PANEL - Abnormal; Notable for the following components:   Glucose, Bld 164 (*)    All other components within normal limits     IMAGES: MRI Face/Sinus 11/11/20 [facility]): Impression: 1. Pansinusitis with polypoid mucosal thickening, widespread fungal colonization and/or inspissated secretions, and suspected large right sinonasal polyp or polyps. Appearance could reflect allergic fungal rhinosinusitis or chronic rhinosinusitis withnasal polyps depending on the clinical setting.  2. No evidence of invasive sinusitis outside of the paranasal sinuses.  CT Paranasal Sinuses 10/22/20 [facility]):  IMPRESSION:  Sinonasal polyposis with extensive generalized opacification and  sinus outflow obstruction. Diffuse septal demineralization with  imperceptible areas of bilateral lamina papyracea, posterior fovea  ethmoidalis, and left sphenoid sinus along the left carotid canal.     EKG: 08/13/20: SR   CV: Echo 05/12/17:  Left ventricle: Normal cavity size. Concentric hypertrophy. LV systolic  function is normal with an EF  of 55-60%. There are no obvious wall motion  abnormalities.   Septum: No Patent Foramen Ovale present.   Left atrium: Patent foramen ovale not present.   Aortic valve: The valve is trileaflet. No stenosis. No regurgitation.   Right ventricle: Normal cavity size and ejection fraction.   Last stress test and cardiac cath were from May 2018  and pre-CABG.  Carotid US 05/09/17: Summary:  Bilateral: intimal wall thickening CCA. Mild soft plaque origin  ICA. 1-39% ICA plaquing. Vertebral artery flow is antegrade.     Past Medical History:  Diagnosis Date  . Anxiety   . Coronary artery disease   . Diabetes mellitus without complication (HCC)    type 2   . Hypertension   . Hypothyroid   . Obstructive sleep apnea    CPAP  . Pure hypercholesterolemia   . PVD (peripheral vascular disease) (HCC)   . Testicular hypofunction   . Vitamin D deficiency     Past Surgical History:  Procedure Laterality Date  . COLONOSCOPY WITH PROPOFOL N/A 11/24/2019   Procedure: COLONOSCOPY WITH PROPOFOL;  Surgeon: Charna Elizabeth, MD;  Location: [facility] ENDOSCOPY;  Service: Endoscopy;  Laterality: N/A;  . CORONARY ARTERY BYPASS GRAFT N/A 05/12/2017   Procedure: CORONARY ARTERY BYPASS GRAFTING (CABG) x three , using left inernal mammary artery and left leg     greater saphenous vein harvested endoscopically;  Surgeon: Kerin Perna, MD;  Location: [facility] OR;  Service: Open Heart Surgery;  Laterality: N/A;  . COSMETIC SURGERY    . INTRAVASCULAR ULTRASOUND/IVUS N/A 05/08/2017   Procedure: Intravascular Ultrasound/IVUS;  Surgeon: Kathleene Hazel, MD;  Location: [facility] INVASIVE CV LAB;  Service: Cardiovascular;  Laterality: N/A;  . LEFT HEART CATH AND CORONARY ANGIOGRAPHY N/A 05/08/2017   Procedure: Left Heart Cath and Coronary Angiography;  Surgeon: Kathleene Hazel, MD;  Location: [facility] INVASIVE CV LAB;  Service: Cardiovascular;  Laterality: N/A;  . TEE WITHOUT CARDIOVERSION N/A 05/12/2017   Procedure: TRANSESOPHAGEAL ECHOCARDIOGRAM (TEE);  Surgeon: Donata Clay, Theron Arista, MD;  Location: [facility] OR;  Service: Open Heart Surgery;  Laterality: N/A;    MEDICATIONS: . aspirin 325 MG EC tablet  . atorvastatin (LIPITOR) 80 MG tablet  . fluticasone (FLONASE) 50 MCG/ACT nasal spray  . KOMBIGLYZE XR 04-999 MG TB24  . levothyroxine (SYNTHROID) 100 MCG tablet  .  lisinopril-hydrochlorothiazide (ZESTORETIC) 20-12.5 MG tablet  . metoprolol tartrate (LOPRESSOR) 25 MG tablet  . Multiple Vitamins-Minerals (MULTIVITAMIN WITH MINERALS) tablet  . sertraline (ZOLOFT) 50 MG tablet   No current facility-administered medications for this encounter.    Shonna Chock, PA-C Surgical Short Stay/Anesthesiology [facility] Phone [phone] [facility] Phone [phone] 12/11/2020 9:13 AM

## 2020-12-10 NOTE — Telephone Encounter (Signed)
Patient states that he is having sinus surgery on Wednesday Morning and the doctor wanted him to contact Dr. Duke Salvia to make sure that she knows

## 2020-12-10 NOTE — Progress Notes (Signed)
PCP - DR Allyne Gee Cardiologist - T Gallatin    PT TO SPEAK WITH HER 12/10/20 AIC 10/21  Chest x-ray - 6/18 EKG - 8/21 Stress Test - NA ECHO - 5/18 Cardiac Cath -         CABG X3 5/18 Sleep Study - YES  CPAP - YES  Fasting Blood Sugar - 123 Checks Blood Sugar __OCC___ times a day  COVID TEST FOR 12/10/20 Anesthesia review: HEART HX  Patient denies shortness of breath, fever, cough and chest pain at PAT appointment   All instructions explained to the patient, with a verbal understanding of the material. Patient agrees to go over the instructions while at home for a better understanding. Patient also instructed to self quarantine after being tested for COVID-19. The opportunity to ask questions was provided.

## 2020-12-10 NOTE — Telephone Encounter (Signed)
   [facility] Pre-operative Risk Assessment    HEARTCARE STAFF: - Please ensure there is not already an duplicate clearance open for this procedure. - Under Visit Info/Reason for Call, type in Other and utilize the format Clearance MM/DD/YY or Clearance TBD. Do not use dashes or single digits. - If request is for dental extraction, please clarify the # of teeth to be extracted.  Request for surgical clearance:  1. What type of surgery is being performed? Endoscopic Sinus Surgery   2. When is this surgery scheduled? 12/12/20  3. What type of clearance is required (medical clearance vs. Pharmacy clearance to hold med vs. Both)? Medical   4. Are there any medications that need to be held prior to surgery and how long? TBD by Cardiologist   5. Practice name and name of physician performing surgery? Dr. Avel Peace   6. What is the office phone number? [phone]   7.   What is the office fax number? [phone]  8.   Anesthesia type (None, local, MAC, general) ? General    Trilby Drummer 12/10/2020, 4:44 PM  _________________________________________________________________   (provider comments below)

## 2020-12-10 NOTE — Telephone Encounter (Signed)
Called pt and notified pt that there is no cardiac in Epic and he should call surgeon's office to let them know that they need to fax cardiac clearance request. For approval.

## 2020-12-11 ENCOUNTER — Telehealth: Payer: Self-pay

## 2020-12-11 NOTE — Telephone Encounter (Signed)
Spoke with patient to see how he was doing on medication atorvastatin. He said he was doing fine, no questions, comments or concerns. He did reschedule appt for jan 12th at 12 for thyroid f/u.

## 2020-12-11 NOTE — Anesthesia Preprocedure Evaluation (Addendum)
Anesthesia Evaluation  Patient identified by MRN, date of birth, ID band Patient awake    Reviewed: Allergy & Precautions, NPO status , Patient's Chart, lab work & pertinent test results  Airway Mallampati: II  TM Distance: >3 FB Neck ROM: Full    Dental no notable dental hx. (+) Dental Advisory Given, Teeth Intact   Pulmonary sleep apnea ,    Pulmonary exam normal breath sounds clear to auscultation       Cardiovascular hypertension, Pt. on home beta blockers and Pt. on medications + CAD, + CABG and + Peripheral Vascular Disease  Normal cardiovascular exam Rhythm:Regular Rate:Normal  Echo 2018  Left ventricle: Normal cavity size. Concentric hypertrophy. LV systolic function is normal with an EF of 55-60%. There are no obvious wall motion abnormalities.   Septum: No Patent Foramen Ovale present.   Left atrium: Patent foramen ovale not present.   Aortic valve: The valve is trileaflet. No stenosis. No regurgitation.   Right ventricle: Normal cavity size and ejection fraction.   Cath 2018  Mid RCA lesion, 10 %stenosed.  Ost LM to LM lesion, 50 %stenosed.  Ost Cx to Prox Cx lesion, 60 %stenosed.  Ost LAD to Prox LAD lesion, 90 %stenosed.  LM lesion, 50 %stenosed.  3rd Mrg lesion, 30 %stenosed.  The left ventricular ejection fraction is greater than 65% by visual estimate.  The left ventricular systolic function is normal.  LV end diastolic pressure is normal.  There is no mitral valve regurgitation.  Ost 1st Diag to 1st Diag lesion, 50 %stenosed.   1. Severe ostial LAD stenosis with moderate stenosis in the distal left main artery and ostial Circumflex artery. This is confirmed with IVUS imaging.  2. Mild plaque in the mid RCA and third OM branch 3. Moderate stenosis in the first diagonal branch 4. Normal LV systolic function    Neuro/Psych PSYCHIATRIC DISORDERS Anxiety negative neurological ROS      GI/Hepatic negative GI ROS, Neg liver ROS,   Endo/Other  diabetesHypothyroidism   Renal/GU negative Renal ROS     Musculoskeletal negative musculoskeletal ROS (+)   Abdominal (+) + obese,   Peds  Hematology negative hematology ROS (+)   Anesthesia Other Findings   Reproductive/Obstetrics                                                             Anesthesia Evaluation  Patient identified by MRN, date of birth, ID band Patient awake    Reviewed: Allergy & Precautions, NPO status , Patient's Chart, lab work & pertinent test results  Airway Mallampati: III  TM Distance: >3 FB Neck ROM: Full    Dental  (+) Dental Advisory Given, Teeth Intact   Pulmonary sleep apnea ,    breath sounds clear to auscultation       Cardiovascular hypertension, Pt. on medications + angina + CAD and + Peripheral Vascular Disease   Rhythm:Regular Rate:Normal  Echo 04/2017  Left ventricle: Normal cavity size. Concentric hypertrophy. LV systolic function is normal with an EF of 55-60%. There are no obvious wall motion abnormalities.  Septum: No Patent Foramen Ovale present.  Left atrium: Patent foramen ovale not present.  Aortic valve: The valve is trileaflet. No stenosis. No regurgitation.  Right ventricle: Normal cavity size and ejection fraction.  Neuro/Psych negative neurological ROS     GI/Hepatic negative GI ROS, Neg liver ROS,   Endo/Other  diabetes, Type 2Hypothyroidism   Renal/GU negative Renal ROS     Musculoskeletal   Abdominal   Peds  Hematology negative hematology ROS (+)   Anesthesia Other Findings   Reproductive/Obstetrics                            Lab Results  Component Value Date   WBC 14.4 (H) 12/10/2020   HGB 15.6 12/10/2020   HCT 46.3 12/10/2020   MCV 89.2 12/10/2020   PLT 295 12/10/2020   Lab Results  Component Value Date   CREATININE 0.83 12/10/2020   BUN 14 12/10/2020   NA 138  12/10/2020   K 4.0 12/10/2020   CL 101 12/10/2020   CO2 26 12/10/2020    Anesthesia Physical  Anesthesia Plan  ASA: III  Anesthesia Plan: MAC   Post-op Pain Management:    Induction:   PONV Risk Score and Plan: 1 and Propofol infusion, Ondansetron and Treatment may vary due to age or medical condition  Airway Management Planned: Natural Airway  Additional Equipment:   Intra-op Plan:   Post-operative Plan:   Informed Consent: I have reviewed the patients History and Physical, chart, labs and discussed the procedure including the risks, benefits and alternatives for the proposed anesthesia with the patient or authorized representative who has indicated his/her understanding and acceptance.     Dental advisory given  Plan Discussed with: CRNA  Anesthesia Plan Comments:         Anesthesia Quick Evaluation  Anesthesia Physical Anesthesia Plan  ASA: III  Anesthesia Plan: General   Post-op Pain Management:    Induction: Intravenous  PONV Risk Score and Plan: 3 and Ondansetron, Dexamethasone, Treatment may vary due to age or medical condition and Midazolam  Airway Management Planned: Oral ETT  Additional Equipment: None  Intra-op Plan:   Post-operative Plan: Extubation in OR  Informed Consent: I have reviewed the patients History and Physical, chart, labs and discussed the procedure including the risks, benefits and alternatives for the proposed anesthesia with the patient or authorized representative who has indicated his/her understanding and acceptance.     Dental advisory given  Plan Discussed with: CRNA  Anesthesia Plan Comments: (PAT note written 12/11/2020 by Myra Gianotti, PA-C. )      Anesthesia Quick Evaluation

## 2020-12-11 NOTE — Telephone Encounter (Signed)
   Primary Cardiologist: Chilton Si, MD  Chart reviewed as part of pre-operative protocol coverage. Given past medical history and time since last visit, based on ACC/AHA guidelines, [name] Hammond would be at acceptable risk for the planned procedure without further cardiovascular testing.   Patient was advised that if he develops new symptoms prior to surgery to contact our office to arrange a follow-up appointment.  He verbalized understanding.  I will route this recommendation to the requesting party via Epic fax function and remove from pre-op pool.  Please call with questions.  Thomasene Ripple. Miriam Kestler NP-C    12/11/2020, 7:05 AM [facility] [address] Office [phone] Fax [phone]

## 2020-12-12 ENCOUNTER — Observation Stay (HOSPITAL_COMMUNITY)
Admission: RE | Admit: 2020-12-12 | Discharge: 2020-12-13 | Disposition: A | Discharge status: Home / Self Care | Payer: Managed Care, Other (non HMO) | Attending: Otolaryngology | Admitting: Otolaryngology

## 2020-12-12 ENCOUNTER — Encounter (HOSPITAL_COMMUNITY)
Admission: RE | Disposition: A | Discharge status: Home / Self Care | Payer: Self-pay | Source: Home / Self Care | Attending: Otolaryngology

## 2020-12-12 ENCOUNTER — Ambulatory Visit (HOSPITAL_COMMUNITY): Payer: Managed Care, Other (non HMO) | Admitting: Certified Registered Nurse Anesthetist

## 2020-12-12 ENCOUNTER — Other Ambulatory Visit: Payer: Self-pay

## 2020-12-12 ENCOUNTER — Ambulatory Visit (HOSPITAL_COMMUNITY): Payer: Managed Care, Other (non HMO) | Admitting: Vascular Surgery

## 2020-12-12 ENCOUNTER — Encounter (HOSPITAL_COMMUNITY): Payer: Self-pay | Admitting: Otolaryngology

## 2020-12-12 DIAGNOSIS — E039 Hypothyroidism, unspecified: Secondary | ICD-10-CM | POA: Insufficient documentation

## 2020-12-12 DIAGNOSIS — J324 Chronic pansinusitis: Secondary | ICD-10-CM | POA: Diagnosis present

## 2020-12-12 DIAGNOSIS — Z7984 Long term (current) use of oral hypoglycemic drugs: Secondary | ICD-10-CM | POA: Diagnosis not present

## 2020-12-12 DIAGNOSIS — Z79899 Other long term (current) drug therapy: Secondary | ICD-10-CM | POA: Diagnosis not present

## 2020-12-12 DIAGNOSIS — I10 Essential (primary) hypertension: Secondary | ICD-10-CM | POA: Insufficient documentation

## 2020-12-12 DIAGNOSIS — E119 Type 2 diabetes mellitus without complications: Secondary | ICD-10-CM | POA: Diagnosis not present

## 2020-12-12 DIAGNOSIS — J330 Polyp of nasal cavity: Secondary | ICD-10-CM | POA: Insufficient documentation

## 2020-12-12 DIAGNOSIS — Z7982 Long term (current) use of aspirin: Secondary | ICD-10-CM | POA: Insufficient documentation

## 2020-12-12 HISTORY — PX: SINUS ENDO W/FUSION: SHX777

## 2020-12-12 HISTORY — PX: ETHMOIDECTOMY: SHX5197

## 2020-12-12 HISTORY — PX: SPHENOIDECTOMY: SHX2421

## 2020-12-12 HISTORY — PX: MAXILLARY ANTROSTOMY: SHX2003

## 2020-12-12 LAB — HEMOGLOBIN A1C
Hgb A1c MFr Bld: 7.6 % — ABNORMAL HIGH (ref 4.8–5.6)
Mean Plasma Glucose: 171.42 mg/dL

## 2020-12-12 LAB — GLUCOSE, CAPILLARY
Glucose-Capillary: 162 mg/dL — ABNORMAL HIGH (ref 70–99)
Glucose-Capillary: 193 mg/dL — ABNORMAL HIGH (ref 70–99)
Glucose-Capillary: 231 mg/dL — ABNORMAL HIGH (ref 70–99)
Glucose-Capillary: 187 mg/dL — ABNORMAL HIGH (ref 70–99)

## 2020-12-12 SURGERY — SINUS SURGERY, ENDOSCOPIC, USING COMPUTER-ASSISTED NAVIGATION
Anesthesia: General | Site: Nose

## 2020-12-12 MED ORDER — LIDOCAINE-EPINEPHRINE 1 %-1:100000 IJ SOLN
INTRAMUSCULAR | Status: AC
  Filled 2020-12-12: qty 1

## 2020-12-12 MED ORDER — DEXAMETHASONE SODIUM PHOSPHATE 10 MG/ML IJ SOLN
INTRAMUSCULAR | Status: AC
  Filled 2020-12-12: qty 1

## 2020-12-12 MED ORDER — DEXAMETHASONE SODIUM PHOSPHATE 10 MG/ML IJ SOLN
INTRAMUSCULAR | Status: DC | PRN
  Administered 2020-12-12: 5 mg via INTRAVENOUS

## 2020-12-12 MED ORDER — LIDOCAINE 2% (20 MG/ML) 5 ML SYRINGE
INTRAMUSCULAR | Status: AC
  Filled 2020-12-12: qty 5

## 2020-12-12 MED ORDER — LEVOTHYROXINE SODIUM 100 MCG PO TABS
100.0000 ug | ORAL_TABLET | Freq: Every day | ORAL | Status: DC
  Administered 2020-12-13: 100 ug via ORAL
  Filled 2020-12-12: qty 1

## 2020-12-12 MED ORDER — OXYMETAZOLINE HCL 0.05 % NA SOLN: 2.0000 | NASAL | Status: DC

## 2020-12-12 MED ORDER — HYDROCODONE-ACETAMINOPHEN 5-325 MG PO TABS
1.0000 | ORAL_TABLET | ORAL | Status: DC | PRN
  Administered 2020-12-12: 1 via ORAL
  Filled 2020-12-12: qty 1

## 2020-12-12 MED ORDER — SUGAMMADEX SODIUM 200 MG/2ML IV SOLN
INTRAVENOUS | Status: DC | PRN
  Administered 2020-12-12: 300 mg via INTRAVENOUS

## 2020-12-12 MED ORDER — IBUPROFEN 100 MG/5ML PO SUSP
400.0000 mg | Freq: Four times a day (QID) | ORAL | Status: DC | PRN
  Filled 2020-12-12: qty 20

## 2020-12-12 MED ORDER — ROCURONIUM BROMIDE 10 MG/ML (PF) SYRINGE
PREFILLED_SYRINGE | INTRAVENOUS | Status: DC | PRN
  Administered 2020-12-12: 30 mg via INTRAVENOUS
  Administered 2020-12-12: 50 mg via INTRAVENOUS

## 2020-12-12 MED ORDER — HYDROCHLOROTHIAZIDE 12.5 MG PO CAPS
12.5000 mg | ORAL_CAPSULE | Freq: Every day | ORAL | Status: DC
  Administered 2020-12-12 – 2020-12-13 (×2): 12.5 mg via ORAL
  Filled 2020-12-12 (×2): qty 1

## 2020-12-12 MED ORDER — ONDANSETRON HCL 4 MG/2ML IJ SOLN
INTRAMUSCULAR | Status: AC
  Filled 2020-12-12: qty 2

## 2020-12-12 MED ORDER — ASPIRIN EC 325 MG PO TBEC
325.0000 mg | DELAYED_RELEASE_TABLET | Freq: Every day | ORAL | Status: DC
  Administered 2020-12-12 – 2020-12-13 (×2): 325 mg via ORAL
  Filled 2020-12-12 (×2): qty 1

## 2020-12-12 MED ORDER — FENTANYL CITRATE (PF) 250 MCG/5ML IJ SOLN
INTRAMUSCULAR | Status: AC
  Filled 2020-12-12: qty 5

## 2020-12-12 MED ORDER — METOPROLOL TARTRATE 12.5 MG HALF TABLET
12.5000 mg | ORAL_TABLET | Freq: Two times a day (BID) | ORAL | Status: DC
  Administered 2020-12-12 – 2020-12-13 (×2): 12.5 mg via ORAL
  Filled 2020-12-12 (×2): qty 1

## 2020-12-12 MED ORDER — PROMETHAZINE HCL 25 MG/ML IJ SOLN: 6.2500 mg | INTRAMUSCULAR | Status: DC | PRN

## 2020-12-12 MED ORDER — SODIUM CHLORIDE 0.9 % IR SOLN
Status: DC | PRN
  Administered 2020-12-12: 1000 mL

## 2020-12-12 MED ORDER — LABETALOL HCL 5 MG/ML IV SOLN
INTRAVENOUS | Status: DC | PRN
  Administered 2020-12-12 (×2): 10 mg via INTRAVENOUS

## 2020-12-12 MED ORDER — MIDAZOLAM HCL 5 MG/5ML IJ SOLN
INTRAMUSCULAR | Status: DC | PRN
  Administered 2020-12-12: 2 mg via INTRAVENOUS

## 2020-12-12 MED ORDER — PROPOFOL 10 MG/ML IV BOLUS
INTRAVENOUS | Status: AC
  Filled 2020-12-12: qty 20

## 2020-12-12 MED ORDER — POTASSIUM CHLORIDE 2 MEQ/ML IV SOLN
INTRAVENOUS | Status: DC
  Filled 2020-12-12 (×2): qty 1000

## 2020-12-12 MED ORDER — BACITRACIN ZINC 500 UNIT/GM EX OINT
TOPICAL_OINTMENT | CUTANEOUS | Status: AC
  Filled 2020-12-12: qty 28.35

## 2020-12-12 MED ORDER — EPHEDRINE SULFATE-NACL 50-0.9 MG/10ML-% IV SOSY
PREFILLED_SYRINGE | INTRAVENOUS | Status: DC | PRN
  Administered 2020-12-12: 10 mg via INTRAVENOUS

## 2020-12-12 MED ORDER — SERTRALINE HCL 50 MG PO TABS
50.0000 mg | ORAL_TABLET | Freq: Every day | ORAL | Status: DC
  Administered 2020-12-13: 50 mg via ORAL
  Filled 2020-12-12: qty 1

## 2020-12-12 MED ORDER — ROCURONIUM BROMIDE 10 MG/ML (PF) SYRINGE
PREFILLED_SYRINGE | INTRAVENOUS | Status: AC
  Filled 2020-12-12: qty 10

## 2020-12-12 MED ORDER — OXYMETAZOLINE HCL 0.05 % NA SOLN
NASAL | Status: AC
  Filled 2020-12-12: qty 30

## 2020-12-12 MED ORDER — FENTANYL CITRATE (PF) 250 MCG/5ML IJ SOLN
INTRAMUSCULAR | Status: DC | PRN
  Administered 2020-12-12: 50 ug via INTRAVENOUS
  Administered 2020-12-12: 100 ug via INTRAVENOUS
  Administered 2020-12-12 (×2): 50 ug via INTRAVENOUS

## 2020-12-12 MED ORDER — ESMOLOL HCL 100 MG/10ML IV SOLN
INTRAVENOUS | Status: DC | PRN
  Administered 2020-12-12: 20 mg via INTRAVENOUS
  Administered 2020-12-12: 50 mg via INTRAVENOUS
  Administered 2020-12-12: 30 mg via INTRAVENOUS

## 2020-12-12 MED ORDER — HYDROMORPHONE HCL 1 MG/ML IJ SOLN: 0.2500 mg | INTRAMUSCULAR | Status: DC | PRN

## 2020-12-12 MED ORDER — SAXAGLIPTIN-METFORMIN ER 5-1000 MG PO TB24: 1.0000 | ORAL_TABLET | Freq: Every day | ORAL | Status: DC

## 2020-12-12 MED ORDER — PHENYLEPHRINE 40 MCG/ML (10ML) SYRINGE FOR IV PUSH (FOR BLOOD PRESSURE SUPPORT)
PREFILLED_SYRINGE | INTRAVENOUS | Status: DC | PRN
  Administered 2020-12-12: 120 ug via INTRAVENOUS
  Administered 2020-12-12: 80 ug via INTRAVENOUS
  Administered 2020-12-12: 40 ug via INTRAVENOUS

## 2020-12-12 MED ORDER — LIDOCAINE 2% (20 MG/ML) 5 ML SYRINGE
INTRAMUSCULAR | Status: DC | PRN
  Administered 2020-12-12: 100 mg via INTRAVENOUS

## 2020-12-12 MED ORDER — PROPOFOL 10 MG/ML IV BOLUS
INTRAVENOUS | Status: DC | PRN
  Administered 2020-12-12: 100 mg via INTRAVENOUS
  Administered 2020-12-12: 50 mg via INTRAVENOUS
  Administered 2020-12-12: 200 mg via INTRAVENOUS

## 2020-12-12 MED ORDER — PROMETHAZINE HCL 25 MG PO TABS: 25.0000 mg | ORAL_TABLET | Freq: Four times a day (QID) | ORAL | Status: DC | PRN

## 2020-12-12 MED ORDER — INSULIN ASPART 100 UNIT/ML ~~LOC~~ SOLN
0.0000 [IU] | Freq: Three times a day (TID) | SUBCUTANEOUS | Status: DC
  Administered 2020-12-13 (×2): 2 [IU] via SUBCUTANEOUS

## 2020-12-12 MED ORDER — CHLORHEXIDINE GLUCONATE 0.12 % MT SOLN
OROMUCOSAL | Status: AC
  Administered 2020-12-12: 30 mL
  Filled 2020-12-12: qty 15

## 2020-12-12 MED ORDER — LACTATED RINGERS IV SOLN: INTRAVENOUS | Status: DC | PRN

## 2020-12-12 MED ORDER — MEPERIDINE HCL 25 MG/ML IJ SOLN: 6.2500 mg | INTRAMUSCULAR | Status: DC | PRN

## 2020-12-12 MED ORDER — LISINOPRIL-HYDROCHLOROTHIAZIDE 20-12.5 MG PO TABS: 1.0000 | ORAL_TABLET | Freq: Every day | ORAL | Status: DC

## 2020-12-12 MED ORDER — ATORVASTATIN CALCIUM 80 MG PO TABS
80.0000 mg | ORAL_TABLET | Freq: Every day | ORAL | Status: DC
  Administered 2020-12-12 – 2020-12-13 (×2): 80 mg via ORAL
  Filled 2020-12-12 (×2): qty 1

## 2020-12-12 MED ORDER — MIDAZOLAM HCL 2 MG/2ML IJ SOLN
INTRAMUSCULAR | Status: AC
  Filled 2020-12-12: qty 2

## 2020-12-12 MED ORDER — ONDANSETRON HCL 4 MG/2ML IJ SOLN
INTRAMUSCULAR | Status: DC | PRN
  Administered 2020-12-12: 4 mg via INTRAVENOUS

## 2020-12-12 MED ORDER — PROMETHAZINE HCL 25 MG RE SUPP
25.0000 mg | Freq: Four times a day (QID) | RECTAL | Status: DC | PRN
  Filled 2020-12-12: qty 1

## 2020-12-12 MED ORDER — LISINOPRIL 20 MG PO TABS
20.0000 mg | ORAL_TABLET | Freq: Every day | ORAL | Status: DC
  Administered 2020-12-12 – 2020-12-13 (×2): 20 mg via ORAL
  Filled 2020-12-12 (×2): qty 1

## 2020-12-12 MED ORDER — 0.9 % SODIUM CHLORIDE (POUR BTL) OPTIME
TOPICAL | Status: DC | PRN
  Administered 2020-12-12: 1000 mL

## 2020-12-12 MED ORDER — PHENYLEPHRINE HCL-NACL 10-0.9 MG/250ML-% IV SOLN
INTRAVENOUS | Status: DC | PRN
  Administered 2020-12-12: 20 ug/min via INTRAVENOUS

## 2020-12-12 MED ORDER — OXYMETAZOLINE HCL 0.05 % NA SOLN
NASAL | Status: DC | PRN
  Administered 2020-12-12: 1

## 2020-12-12 MED ORDER — METFORMIN HCL 500 MG PO TABS
1000.0000 mg | ORAL_TABLET | Freq: Every day | ORAL | Status: DC
  Administered 2020-12-12 – 2020-12-13 (×2): 1000 mg via ORAL
  Filled 2020-12-12 (×2): qty 2

## 2020-12-12 MED ORDER — ADULT MULTIVITAMIN W/MINERALS CH
1.0000 | ORAL_TABLET | Freq: Every day | ORAL | Status: DC
  Administered 2020-12-12 – 2020-12-13 (×2): 1 via ORAL
  Filled 2020-12-12 (×2): qty 1

## 2020-12-12 MED ORDER — LINAGLIPTIN 5 MG PO TABS
5.0000 mg | ORAL_TABLET | Freq: Every day | ORAL | Status: DC
  Administered 2020-12-12 – 2020-12-13 (×2): 5 mg via ORAL
  Filled 2020-12-12 (×2): qty 1

## 2020-12-12 MED ORDER — LIDOCAINE-EPINEPHRINE 1 %-1:100000 IJ SOLN
INTRAMUSCULAR | Status: DC | PRN
  Administered 2020-12-12: 10 mL

## 2020-12-12 MED ORDER — BACITRACIN ZINC 500 UNIT/GM EX OINT
TOPICAL_OINTMENT | CUTANEOUS | Status: DC | PRN
  Administered 2020-12-12: 1 via TOPICAL

## 2020-12-12 SURGICAL SUPPLY — 33 items
BLADE RAD40 ROTATE 4M 4 5PK (BLADE) IMPLANT
BLADE RAD60 ROTATE M4 4 5PK (BLADE) IMPLANT
BLADE TRICUT ROTATE M4 4 5PK (BLADE) ×3 IMPLANT
CANISTER SUCT 3000ML PPV (MISCELLANEOUS) ×3 IMPLANT
COVER WAND RF STERILE (DRAPES) ×3 IMPLANT
DRAPE HALF SHEET 40X57 (DRAPES) IMPLANT
DRESSING NASAL KENNEDY 3.5X.9 (MISCELLANEOUS) IMPLANT
DRSG NASAL KENNEDY 3.5X.9 (MISCELLANEOUS)
DRSG NASOPORE 8CM (GAUZE/BANDAGES/DRESSINGS) ×1 IMPLANT
ELECT REM PT RETURN 9FT ADLT (ELECTROSURGICAL)
ELECTRODE REM PT RTRN 9FT ADLT (ELECTROSURGICAL) IMPLANT
FILTER ARTHROSCOPY CONVERTOR (FILTER) IMPLANT
GLOVE ECLIPSE 7.5 STRL STRAW (GLOVE) ×3 IMPLANT
GOWN STRL REUS W/ TWL LRG LVL3 (GOWN DISPOSABLE) ×4 IMPLANT
GOWN STRL REUS W/TWL LRG LVL3 (GOWN DISPOSABLE) ×6
KIT BASIN OR (CUSTOM PROCEDURE TRAY) ×3 IMPLANT
KIT TURNOVER KIT B (KITS) ×3 IMPLANT
NDL PRECISIONGLIDE 27X1.5 (NEEDLE) ×2 IMPLANT
NEEDLE PRECISIONGLIDE 27X1.5 (NEEDLE) ×3 IMPLANT
NS IRRIG 1000ML POUR BTL (IV SOLUTION) ×3 IMPLANT
PAD ARMBOARD 7.5X6 YLW CONV (MISCELLANEOUS) ×6 IMPLANT
PATTIES SURGICAL .5 X3 (DISPOSABLE) ×3 IMPLANT
SHEATH ENDOSCRUB 0 DEG (SHEATH) IMPLANT
SHEATH ENDOSCRUB 30 DEG (SHEATH) IMPLANT
SPECIMEN JAR SMALL (MISCELLANEOUS) ×3 IMPLANT
SWAB COLLECTION DEVICE MRSA (MISCELLANEOUS) IMPLANT
SWAB CULTURE ESWAB REG 1ML (MISCELLANEOUS) IMPLANT
SYR 50ML SLIP (SYRINGE) IMPLANT
TOWEL GREEN STERILE FF (TOWEL DISPOSABLE) ×3 IMPLANT
TRAY ENT MC OR (CUSTOM PROCEDURE TRAY) ×3 IMPLANT
TUBE CONNECTING 12X1/4 (SUCTIONS) ×3 IMPLANT
TUBING EXTENTION W/L.L. (IV SETS) IMPLANT
WATER STERILE IRR 1000ML POUR (IV SOLUTION) ×3 IMPLANT

## 2020-12-12 NOTE — Transfer of Care (Signed)
Immediate Anesthesia Transfer of Care Note  Patient: [name] Hammond  Procedure(s) Performed: ENDOSCOPIC SINUS SURGERY (Bilateral Nose) ETHMOIDECTOMY (N/A Nose) SPHENOIDECTOMY (N/A Nose) FRONTAL MAXILLARY ANTROSTOMY (N/A Nose)  Patient Location: PACU  Anesthesia Type:General  Level of Consciousness: awake  Airway & Oxygen Therapy: Patient Spontanous Breathing  Post-op Assessment: Report given to RN and Post -op Vital signs reviewed and stable  Post vital signs: Reviewed and stable  Last Vitals:  Vitals Value Taken Time  BP 143/85 12/12/20 1139  Temp    Pulse 68 12/12/20 1140  Resp 17 12/12/20 1140  SpO2 91 % 12/12/20 1140  Vitals shown include unvalidated device data.  Last Pain:  Vitals:   12/12/20 0851  TempSrc:   PainSc: 0-No pain         Complications: No complications documented.

## 2020-12-12 NOTE — Progress Notes (Signed)
Postop check-ENT  He is awake and alert and complains of little bit of sore throat.  His blood sugars have been running high.  He had a little bit of intermittent bleeding but is doing well now.  Right now the nasal drip pad is in place without any active bleeding.  Continue overnight care and continue to monitor blood sugars.

## 2020-12-12 NOTE — Anesthesia Postprocedure Evaluation (Signed)
Anesthesia Post Note  Patient: [name] Hammond  Procedure(s) Performed: ENDOSCOPIC SINUS SURGERY (Bilateral Nose) ETHMOIDECTOMY (N/A Nose) SPHENOIDECTOMY (N/A Nose) FRONTAL MAXILLARY ANTROSTOMY (N/A Nose)     Patient location during evaluation: PACU Anesthesia Type: General Level of consciousness: sedated and patient cooperative Pain management: pain level controlled Vital Signs Assessment: post-procedure vital signs reviewed and stable Respiratory status: spontaneous breathing Cardiovascular status: stable Anesthetic complications: no   No complications documented.  Last Vitals:  Vitals:   12/12/20 1440 12/12/20 1505  BP: 134/76 (!) 149/90  Pulse: 67 68  Resp: (!) 23 16  Temp: 36.7 C 36.8 C  SpO2: 94% 94%    Last Pain:  Vitals:   12/12/20 1549  TempSrc:   PainSc: 4                  [name]

## 2020-12-12 NOTE — Anesthesia Procedure Notes (Signed)
Procedure Name: Intubation Performed by: Ezekiel Ina, CRNA Pre-anesthesia Checklist: Patient identified, Emergency Drugs available, Suction available and Patient being monitored Patient Re-evaluated:Patient Re-evaluated prior to induction Oxygen Delivery Method: Circle System Utilized Preoxygenation: Pre-oxygenation with 100% oxygen Induction Type: IV induction Ventilation: Mask ventilation with difficulty Laryngoscope Size: Miller and 3 Grade View: Grade I Tube type: Oral Tube size: 7.5 mm Number of attempts: 1 Airway Equipment and Method: Stylet and Oral airway Placement Confirmation: ETT inserted through vocal cords under direct vision,  positive ETCO2 and breath sounds checked- equal and bilateral Secured at: 24 cm Tube secured with: Tape Dental Injury: Teeth and Oropharynx as per pre-operative assessment

## 2020-12-12 NOTE — Interval H&P Note (Signed)
History and Physical Interval Note:  12/12/2020 9:08 AM  [name] Hammond  has presented today for surgery, with the diagnosis of Chronic pansinusitis Nasal polyposis.  The various methods of treatment have been discussed with the patient and family. After consideration of risks, benefits and other options for treatment, the patient has consented to  Procedure(s): ENDOSCOPIC SINUS SURGERY (Bilateral) as a surgical intervention.  The patient's history has been reviewed, patient examined, no change in status, stable for surgery.  I have reviewed the patient's chart and labs.  Questions were answered to the patient's satisfaction.     Serena Colonel

## 2020-12-12 NOTE — Op Note (Signed)
OPERATIVE REPORT  DATE OF SURGERY: 12/12/2020  PATIENT:  [name] Hammond,  59 y.o. male  PRE-OPERATIVE DIAGNOSIS:  Chronic pansinusitis Nasal polyposis  POST-OPERATIVE DIAGNOSIS:  Chronic pansinusitis Nasal polyposis  PROCEDURE:  Procedure(s): Bilateral ENDOSCOPIC SINUS SURGERY, including: Total ETHMOIDECTOMY SPHENOIDOTOMY FRONTAL sinusotomy MAXILLARY ANTROSTOMY, with removal of tissue from both sides. Bilateral endoscopic polypectomy  SURGEON:  Susy Frizzle, MD  ASSISTANTS: None  ANESTHESIA:   General   EBL: 350 ml  DRAINS: None  LOCAL MEDICATIONS USED: 1% Xylocaine with epinephrine  SPECIMEN: Bilateral nasal and sinus contents  COUNTS:  Correct  PROCEDURE DETAILS: The patient was taken to the operating room and placed on the operating table in the supine position. Following induction of general endotracheal anesthesia, the face was prepped and draped in a standard fashion.  Oxymetazoline spray was used preoperatively nasal cavities.  1.  Bilateral endoscopic nasal polypectomy.  Using a 0 degree endoscope the nasal cavities were inspected bilaterally.  There is a large mass of polyps originating from the right middle meatus and a smaller mass of polyps originating from the left.  I was unable to visualize the middle turbinates initially.  Local anesthetic was infiltrated into the polypoid masses.  Polypectomy was performed using the microdebrider.  Once I was able to adequately visualize the middle turbinates they were infiltrated with local anesthetic in the superior and posterior attachments.  Continued polypectomy was performed until the ethmoid bulla was exposed.  2.  Bilateral endoscopic total ethmoidectomy.  Complete bilateral ethmoid dissection was accomplished using a combination of 0 and 30 degree nasal endoscopes and the microdebrider as well as biting and straight Wilde-Blakesley forceps.  There is extensive polypoid disease present on both sides, with fungal  allergic mucin present as well and multiple cells on each side.  The grand lamella was taken down to open up the posterior ethmoid cells.  The lamina papyracea was kept intact bilaterally.  Lollie Sails was kept intact superiorly.  The middle turbinate remnant was also kept intact.  Complete dissection was accomplished.  At the termination of the procedure each ethmoid cavity was packed with half of a NasalPore dressing coated with bacitracin ointment.  3.  Bilateral endoscopic maxillary antrostomy with removal of tissue.  Using 30 degree scope and curved suction the fontanelle was inspected and opened into the antrum on both sides.  The antrostomy was enlarged anteriorly using backbiting forceps and posteriorly using Tru-Cut forceps.  On both sides the antrum contained polyp and fungal debris, much worse on the right.  This was all cleaned out.  Using a curved suction and a large syringe saline was used to irrigate each side as well to facilitate removal of all of the fungal disease.  4.  Right endoscopic   sphenoidotomy.  Based on CT imaging the left sphenoid sinus was very small.  The right side was much larger and filled with disease.  Using the 0 degree endoscope and a straight suction the superior meatus region was inspected at the sphenoid rostrum.  Polypoid tissue was removed using the microdebrider.  The natural sphenoid ostium was identified with a suction and was entered and large amount of fungal mucin was suctioned out.  The debrider was used to remove additional polypoid mucosa just inside the opening.  The sinus was large and widely patent and filled with fungal mucin inferiorly.  This was cleaned out using an angled suction and irrigating with saline.  5.  Bilateral endoscopic frontal sinusotomy.  After the ethmoid dissection  was completed the frontal recess was inspected using a 30 degree endoscope.  Giraffe and up-biting forceps were used to remove polypoid tissue from the frontal recess  bilaterally.  The frontal sinus was opened widely.  The nasal cavities and pharynx were suctioned of blood and secretions.  Patient was awakened extubated and transferred to recovery in stable condition.       PATIENT DISPOSITION:  To PACU, stable

## 2020-12-13 ENCOUNTER — Encounter (HOSPITAL_COMMUNITY): Payer: Self-pay | Admitting: Otolaryngology

## 2020-12-13 DIAGNOSIS — J324 Chronic pansinusitis: Secondary | ICD-10-CM | POA: Diagnosis not present

## 2020-12-13 LAB — GLUCOSE, CAPILLARY
Glucose-Capillary: 144 mg/dL — ABNORMAL HIGH (ref 70–99)
Glucose-Capillary: 132 mg/dL — ABNORMAL HIGH (ref 70–99)

## 2020-12-13 LAB — SURGICAL PATHOLOGY

## 2020-12-13 MED ORDER — PROMETHAZINE HCL 25 MG RE SUPP: 25.0000 mg | Freq: Four times a day (QID) | RECTAL | 1 refills | Status: DC | PRN

## 2020-12-13 MED ORDER — HYDROCODONE-ACETAMINOPHEN 7.5-325 MG PO TABS: 1.0000 | ORAL_TABLET | Freq: Four times a day (QID) | ORAL | 0 refills | Status: DC | PRN

## 2020-12-13 NOTE — Discharge Summary (Signed)
Physician Discharge Summary  Patient ID: [name] Hammond MRN: [id number] DOB/AGE: 59-Mar-1962 59 y.o.  Admit date: 12/12/2020 Discharge date: 12/13/2020  Admission Diagnoses: Chronic sinusitis  Discharge Diagnoses:  Active Problems:   Chronic pansinusitis   Discharged Condition: good  Hospital Course: No complications  Consults: none  Significant Diagnostic Studies: none  Treatments: surgery: Bilateral endoscopic sinus surgery  Discharge Exam: Blood pressure 134/66, pulse 63, temperature 97.9 F (36.6 C), temperature source Oral, resp. rate 18, height 6\' 3"  (1.905 m), weight 129.3 kg, SpO2 97 %. PHYSICAL EXAM: Awake and alert, no facial swelling.  Eyes look normal.  Sinus packing in place.  No bleeding.  Disposition:    Allergies as of 12/13/2020   No Known Allergies     Medication List    TAKE these medications   aspirin 325 MG EC tablet Take 325 mg by mouth daily.   atorvastatin 80 MG tablet Commonly known as: LIPITOR TAKE 1 TABLET(80 MG) BY MOUTH AT BEDTIME What changed: See the new instructions.   fluticasone 50 MCG/ACT nasal spray Commonly known as: FLONASE SHAKE LIQUID AND USE 1 SPRAY IN EACH NOSTRIL DAILY What changed: See the new instructions.   HYDROcodone-acetaminophen 7.5-325 MG tablet Commonly known as: Norco Take 1 tablet by mouth every 6 (six) hours as needed for moderate pain.   Kombiglyze XR 04-999 MG Tb24 Generic drug: Saxagliptin-Metformin TAKE 1 TABLET BY MOUTH EVERY DAY WITH THE EVENING MEAL What changed: See the new instructions.   levothyroxine 100 MCG tablet Commonly known as: SYNTHROID TAKE 1 TABLET(100 MCG) BY MOUTH DAILY What changed: See the new instructions.   lisinopril-hydrochlorothiazide 20-12.5 MG tablet Commonly known as: ZESTORETIC TAKE 1 TABLET BY MOUTH DAILY   metoprolol tartrate 25 MG tablet Commonly known as: LOPRESSOR TAKE 1/2 TABLET(12.5 MG) BY MOUTH TWICE DAILY What changed: See the new instructions.    multivitamin with minerals tablet Take 1 tablet by mouth daily.   promethazine 25 MG suppository Commonly known as: PHENERGAN Place 1 suppository (25 mg total) rectally every 6 (six) hours as needed for nausea or vomiting.   sertraline 50 MG tablet Commonly known as: ZOLOFT TAKE 1 TABLET(50 MG) BY MOUTH DAILY What changed: See the new instructions.        Signed: 04-21-1961 12/13/2020, 11:55 AM

## 2020-12-13 NOTE — Discharge Instructions (Signed)
Do not blow your nose for the next 2 or 3 weeks. Avoid lifting anything greater than about 10 pounds. Avoid bending over. Start using saline nasal spray every 30-60 minutes while awake.  You can purchase this over-the-counter.  If you experience any unusual bleeding you may use the decongestant spray that was left with you from the hospital.

## 2020-12-13 NOTE — TOC Transition Note (Signed)
Transition of Care [facility]) - CM/SW Discharge Note   Patient Details  Name: [name] Hammond MRN: [id number] Date of Birth: 1961-10-08  [facility] CM/SW Contact:  Kermit Balo, RN Phone Number: 12/13/2020, 1:04 PM   Clinical Narrative:    Pt is discharging home with self care. No needs per TOC.   Final next level of care: Home/Self Care Barriers to Discharge: No Barriers Identified   Patient Goals and CMS Choice        Discharge Placement                       Discharge Plan and Services                                     Social Determinants of Health (SDOH) Interventions     Readmission Risk Interventions No flowsheet data found.

## 2020-12-20 ENCOUNTER — Other Ambulatory Visit: Payer: Self-pay | Admitting: Internal Medicine

## 2020-12-21 ENCOUNTER — Other Ambulatory Visit: Payer: Self-pay | Admitting: Internal Medicine

## 2021-01-02 ENCOUNTER — Other Ambulatory Visit: Payer: Self-pay

## 2021-01-02 ENCOUNTER — Encounter: Payer: Self-pay | Admitting: Internal Medicine

## 2021-01-02 ENCOUNTER — Ambulatory Visit (INDEPENDENT_AMBULATORY_CARE_PROVIDER_SITE_OTHER): Payer: Managed Care, Other (non HMO) | Admitting: Internal Medicine

## 2021-01-02 VITALS — BP 120/72 | HR 86 | Temp 97.9°F | Ht 75.0 in | Wt 295.8 lb

## 2021-01-02 DIAGNOSIS — I2510 Atherosclerotic heart disease of native coronary artery without angina pectoris: Secondary | ICD-10-CM | POA: Diagnosis not present

## 2021-01-02 DIAGNOSIS — E1165 Type 2 diabetes mellitus with hyperglycemia: Secondary | ICD-10-CM

## 2021-01-02 DIAGNOSIS — E039 Hypothyroidism, unspecified: Secondary | ICD-10-CM

## 2021-01-02 DIAGNOSIS — I119 Hypertensive heart disease without heart failure: Secondary | ICD-10-CM | POA: Diagnosis not present

## 2021-01-02 DIAGNOSIS — Z6836 Body mass index (BMI) 36.0-36.9, adult: Secondary | ICD-10-CM

## 2021-01-02 NOTE — Patient Instructions (Signed)
Hypothyroidism  Hypothyroidism is when the thyroid gland does not make enough of certain hormones (it is underactive). The thyroid gland is a small gland located in the lower front part of the neck, just in front of the windpipe (trachea). This gland makes hormones that help control how the body uses food for energy (metabolism) as well as how the heart and brain function. These hormones also play a role in keeping your bones strong. When the thyroid is underactive, it produces too little of the hormones thyroxine (T4) and triiodothyronine (T3). What are the causes? This condition may be caused by:  Hashimoto's disease. This is a disease in which the body's disease-fighting system (immune system) attacks the thyroid gland. This is the most common cause.  Viral infections.  Pregnancy.  Certain medicines.  Birth defects.  Past radiation treatments to the head or neck for cancer.  Past treatment with radioactive iodine.  Past exposure to radiation in the environment.  Past surgical removal of part or all of the thyroid.  Problems with a gland in the center of the brain (pituitary gland).  Lack of enough iodine in the diet. What increases the risk? You are more likely to develop this condition if:  You are male.  You have a family history of thyroid conditions.  You use a medicine called lithium.  You take medicines that affect the immune system (immunosuppressants). What are the signs or symptoms? Symptoms of this condition include:  Feeling as though you have no energy (lethargy).  Not being able to tolerate cold.  Weight gain that is not explained by a change in diet or exercise habits.  Lack of appetite.  Dry skin.  Coarse hair.  Menstrual irregularity.  Slowing of thought processes.  Constipation.  Sadness or depression. How is this diagnosed? This condition may be diagnosed based on:  Your symptoms, your medical history, and a physical exam.  Blood  tests. You may also have imaging tests, such as an ultrasound or MRI. How is this treated? This condition is treated with medicine that replaces the thyroid hormones that your body does not make. After you begin treatment, it may take several weeks for symptoms to go away. Follow these instructions at home:  Take over-the-counter and prescription medicines only as told by your health care provider.  If you start taking any new medicines, tell your health care provider.  Keep all follow-up visits as told by your health care provider. This is important. ? As your condition improves, your dosage of thyroid hormone medicine may change. ? You will need to have blood tests regularly so that your health care provider can monitor your condition. Contact a health care provider if:  Your symptoms do not get better with treatment.  You are taking thyroid hormone replacement medicine and you: ? Sweat a lot. ? Have tremors. ? Feel anxious. ? Lose weight rapidly. ? Cannot tolerate heat. ? Have emotional swings. ? Have diarrhea. ? Feel weak. Get help right away if you have:  Chest pain.  An irregular heartbeat.  A rapid heartbeat.  Difficulty breathing. Summary  Hypothyroidism is when the thyroid gland does not make enough of certain hormones (it is underactive).  When the thyroid is underactive, it produces too little of the hormones thyroxine (T4) and triiodothyronine (T3).  The most common cause is Hashimoto's disease, a disease in which the body's disease-fighting system (immune system) attacks the thyroid gland. The condition can also be caused by viral infections, medicine, pregnancy, or   past radiation treatment to the head or neck.  Symptoms may include weight gain, dry skin, constipation, feeling as though you do not have energy, and not being able to tolerate cold.  This condition is treated with medicine to replace the thyroid hormones that your body does not make. This  information is not intended to replace advice given to you by your health care provider. Make sure you discuss any questions you have with your health care provider. Document Revised: 09/07/2020 Document Reviewed: 08/23/2020 Elsevier Patient Education  2021 Elsevier Inc.  

## 2021-01-02 NOTE — Progress Notes (Signed)
I,Katawbba Wiggins,acting as a Neurosurgeon for Gwynneth Aliment, MD.,have documented all relevant documentation on the behalf of Gwynneth Aliment, MD,as directed by  Gwynneth Aliment, MD while in the presence of Gwynneth Aliment, MD.  This visit occurred during the SARS-CoV-2 public health emergency.  Safety protocols were in place, including screening questions prior to the visit, additional usage of staff PPE, and extensive cleaning of exam room while observing appropriate contact time as indicated for disinfecting solutions.  Subjective:     Patient ID: [name] Hammond , male    DOB: December 09, 1961 , 60 y.o.   MRN: [id number]   Chief Complaint  Patient presents with  . Hypothyroidism    HPI  He presents today for thyroid check. He reports compliance with meds. He denies headaches, chest pain and shortness of breath.   Hypertension This is a chronic problem. The current episode started more than 1 year ago. The problem has been gradually improving since onset. The problem is controlled. Pertinent negatives include no palpitations or shortness of breath. Risk factors for coronary artery disease include diabetes mellitus, dyslipidemia, obesity, sedentary lifestyle and male gender. The current treatment provides moderate improvement. Compliance problems include exercise.  Hypertensive end-organ damage includes CAD/MI.     Past Medical History:  Diagnosis Date  . Anxiety   . Coronary artery disease   . Diabetes mellitus without complication (HCC)    type 2   . Hypertension   . Hypothyroid   . Obstructive sleep apnea    CPAP  . Pure hypercholesterolemia   . PVD (peripheral vascular disease) (HCC)   . Testicular hypofunction   . Vitamin D deficiency      Family History  Problem Relation Age of Onset  . Diabetes type II Mother   . Thyroid disease Mother   . Cancer - Other Father        Liver  . Hypertension Father   . Heart disease Father        Bypass surgery  . Thyroid disease Sister    . Diabetes type II Brother   . Diabetes type II Brother      Current Outpatient Medications:  .  aspirin 81 MG EC tablet, Take 4 tablets (325 mg total) by mouth daily. 1 tablet daily, Disp: , Rfl:  .  atorvastatin (LIPITOR) 80 MG tablet, TAKE 1 TABLET(80 MG) BY MOUTH AT BEDTIME (Patient taking differently: Take 80 mg by mouth daily.), Disp: 90 tablet, Rfl: 2 .  fluticasone (FLONASE) 50 MCG/ACT nasal spray, SHAKE LIQUID AND USE 1 SPRAY IN EACH NOSTRIL DAILY (Patient taking differently: Place 1 spray into both nostrils daily as needed for allergies.), Disp: 16 g, Rfl: 2 .  HYDROcodone-acetaminophen (NORCO) 7.5-325 MG tablet, Take 1 tablet by mouth every 6 (six) hours as needed for moderate pain., Disp: 20 tablet, Rfl: 0 .  KOMBIGLYZE XR 04-999 MG TB24, TAKE 1 TABLET BY MOUTH EVERY DAY WITH THE EVENING MEAL (Patient taking differently: Take 1 tablet by mouth daily. TAKE 1 TABLET BY MOUTH EVERY DAY WITH THE EVENING MEAL), Disp: 90 tablet, Rfl: 1 .  levothyroxine (SYNTHROID) 100 MCG tablet, TAKE 1 TABLET(100 MCG) BY MOUTH DAILY (Patient taking differently: Take 100 mcg by mouth daily before breakfast.), Disp: 90 tablet, Rfl: 0 .  lisinopril-hydrochlorothiazide (ZESTORETIC) 20-12.5 MG tablet, TAKE 1 TABLET BY MOUTH DAILY, Disp: 90 tablet, Rfl: 1 .  metoprolol tartrate (LOPRESSOR) 25 MG tablet, TAKE 1/2 TABLET(12.5 MG) BY MOUTH TWICE DAILY, Disp: 90 tablet, Rfl:  1 .  promethazine (PHENERGAN) 25 MG suppository, Place 1 suppository (25 mg total) rectally every 6 (six) hours as needed for nausea or vomiting., Disp: 12 suppository, Rfl: 1 .  sertraline (ZOLOFT) 50 MG tablet, TAKE 1 TABLET(50 MG) BY MOUTH DAILY (Patient taking differently: Take 50 mg by mouth daily.), Disp: 90 tablet, Rfl: 2 .  Multiple Vitamins-Minerals (MULTIVITAMIN WITH MINERALS) tablet, Take 1 tablet by mouth daily. (Patient not taking: Reported on 01/02/2021), Disp: , Rfl:    No Known Allergies   Review of Systems  Constitutional:  Negative.   Respiratory: Negative.  Negative for shortness of breath.   Cardiovascular: Negative.  Negative for palpitations.  Gastrointestinal: Negative.   Psychiatric/Behavioral: Negative.   All other systems reviewed and are negative.    Today's Vitals   01/02/21 1217  BP: 120/72  Pulse: 86  Temp: 97.9 F (36.6 C)  TempSrc: Oral  Weight: 295 lb 12.8 oz (134.2 kg)  Height: 6\' 3"  (1.905 m)   Body mass index is 36.97 kg/m.  Wt Readings from Last 3 Encounters:  01/02/21 295 lb 12.8 oz (134.2 kg)  12/12/20 285 lb (129.3 kg)  12/10/20 294 lb (133.4 kg)   Objective:  Physical Exam Vitals and nursing note reviewed.  Constitutional:      Appearance: Normal appearance. He is obese.  HENT:     Head: Normocephalic and atraumatic.  Cardiovascular:     Rate and Rhythm: Normal rate and regular rhythm.     Heart sounds: Normal heart sounds.  Pulmonary:     Breath sounds: Normal breath sounds.  Skin:    General: Skin is warm.  Neurological:     General: No focal deficit present.     Mental Status: He is alert and oriented to person, place, and time.         Assessment And Plan:     1. Primary hypothyroidism Comments: I will check thyroid panel and adjust meds as needed.  - TSH - T4, free  2. Benign hypertensive heart disease without heart failure Comments: Chronic, well controlled. He will continue with lisinopril/hctz and encouraged to follow low sodium diet.  3. Coronary artery disease involving native coronary artery of native heart without angina pectoris Comments: Chronic. Encouraged to follow heart healthy lifestyle and I stressed the importance of regular exercise.   4. Uncontrolled type 2 diabetes mellitus with hyperglycemia (HCC) Comments: Will consider switching to Ozepmic once current supply of Kombiglyze is complete. He denies personal/family h/o thyroid cancer.   5. Class 2 severe obesity due to excess calories with serious comorbidity and body mass index  (BMI) of 36.0 to 36.9 in adult [facility])  He is encouraged to initially strive for BMI less than 30 to decrease cardiac risk. He is advised to exercise no less than 150 minutes per week.   Patient was given opportunity to ask questions. Patient verbalized understanding of the plan and was able to repeat key elements of the plan. All questions were answered to their satisfaction.  [facility], MD   I, Gwynneth Aliment, MD, have reviewed all documentation for this visit. The documentation on 01/02/21 for the exam, diagnosis, procedures, and orders are all accurate and complete.  THE PATIENT IS ENCOURAGED TO PRACTICE SOCIAL DISTANCING DUE TO THE COVID-19 PANDEMIC.

## 2021-01-03 LAB — TSH: TSH: 1.57 u[IU]/mL (ref 0.450–4.500)

## 2021-01-03 LAB — T4, FREE: Free T4: 1.42 ng/dL (ref 0.82–1.77)

## 2021-01-15 ENCOUNTER — Ambulatory Visit (INDEPENDENT_AMBULATORY_CARE_PROVIDER_SITE_OTHER): Payer: Managed Care, Other (non HMO)

## 2021-01-15 ENCOUNTER — Other Ambulatory Visit: Payer: Self-pay

## 2021-01-15 VITALS — BP 136/70 | HR 70 | Temp 98.3°F | Ht 75.0 in | Wt 292.0 lb

## 2021-01-15 DIAGNOSIS — Z23 Encounter for immunization: Secondary | ICD-10-CM

## 2021-01-15 NOTE — Progress Notes (Signed)
Patient is here today for a flu shot. 

## 2021-01-23 ENCOUNTER — Other Ambulatory Visit: Payer: Self-pay

## 2021-01-23 NOTE — Telephone Encounter (Signed)
Prior authorization done for [facility] waiting on a response from the pt's insurance.

## 2021-01-25 ENCOUNTER — Other Ambulatory Visit: Payer: Self-pay | Admitting: Internal Medicine

## 2021-03-23 ENCOUNTER — Other Ambulatory Visit: Payer: Self-pay | Admitting: Internal Medicine

## 2021-04-01 ENCOUNTER — Other Ambulatory Visit: Payer: Self-pay | Admitting: Internal Medicine

## 2021-04-02 ENCOUNTER — Other Ambulatory Visit: Payer: Self-pay

## 2021-04-02 ENCOUNTER — Encounter: Payer: Self-pay | Admitting: Internal Medicine

## 2021-04-02 ENCOUNTER — Ambulatory Visit (INDEPENDENT_AMBULATORY_CARE_PROVIDER_SITE_OTHER): Payer: Managed Care, Other (non HMO) | Admitting: Internal Medicine

## 2021-04-02 VITALS — BP 124/70 | HR 68 | Temp 98.0°F | Ht 75.0 in | Wt 289.8 lb

## 2021-04-02 DIAGNOSIS — E1165 Type 2 diabetes mellitus with hyperglycemia: Secondary | ICD-10-CM | POA: Diagnosis not present

## 2021-04-02 DIAGNOSIS — Z6836 Body mass index (BMI) 36.0-36.9, adult: Secondary | ICD-10-CM

## 2021-04-02 DIAGNOSIS — I119 Hypertensive heart disease without heart failure: Secondary | ICD-10-CM | POA: Diagnosis not present

## 2021-04-02 DIAGNOSIS — Z23 Encounter for immunization: Secondary | ICD-10-CM

## 2021-04-02 LAB — CMP14+EGFR
ALT: 30 IU/L (ref 0–44)
AST: 27 IU/L (ref 0–40)
Albumin/Globulin Ratio: 1.3 (ref 1.2–2.2)
Albumin: 4.4 g/dL (ref 3.8–4.9)
Alkaline Phosphatase: 113 IU/L (ref 44–121)
BUN/Creatinine Ratio: 18 (ref 10–24)
BUN: 17 mg/dL (ref 8–27)
Bilirubin Total: 0.3 mg/dL (ref 0.0–1.2)
CO2: 22 mmol/L (ref 20–29)
Calcium: 9.5 mg/dL (ref 8.6–10.2)
Chloride: 102 mmol/L (ref 96–106)
Creatinine, Ser: 0.94 mg/dL (ref 0.76–1.27)
Globulin, Total: 3.3 g/dL (ref 1.5–4.5)
Glucose: 113 mg/dL — ABNORMAL HIGH (ref 65–99)
Potassium: 4.3 mmol/L (ref 3.5–5.2)
Sodium: 143 mmol/L (ref 134–144)
Total Protein: 7.7 g/dL (ref 6.0–8.5)
eGFR: 93 mL/min/{1.73_m2} (ref >59)

## 2021-04-02 LAB — HEMOGLOBIN A1C
Est. average glucose Bld gHb Est-mCnc: 146 mg/dL
Hgb A1c MFr Bld: 6.7 % — ABNORMAL HIGH (ref 4.8–5.6)

## 2021-04-02 MED ORDER — SHINGRIX 50 MCG/0.5ML IM SUSR: 0.5000 mL | Freq: Once | INTRAMUSCULAR | 0 refills | Status: AC

## 2021-04-02 MED ORDER — XIGDUO XR 10-1000 MG PO TB24: 1.0000 | ORAL_TABLET | Freq: Every day | ORAL | 1 refills | Status: DC

## 2021-04-02 NOTE — Progress Notes (Signed)
I,Katawbba Wiggins,acting as a Education administrator for [name], MD.,have documented all relevant documentation on the behalf of [name], MD,as directed by  [name], MD while in the presence of [name], MD.  This visit occurred during the SARS-CoV-2 public health emergency.  Safety protocols were in place, including screening questions prior to the visit, additional usage of staff PPE, and extensive cleaning of exam room while observing appropriate contact time as indicated for disinfecting solutions.  Subjective:     Patient ID: [name] Hammond , male    DOB: 08-17-1961 , 60 y.o.   MRN: [id number]   Chief Complaint  Patient presents with  . Diabetes  . Hypertension    HPI  The patient is here today for a diabetes, blood pressure f/u. He reports compliance with meds. He denies chest pain, palpitations and headaches.   Diabetes He presents for his follow-up diabetic visit. He has type 2 diabetes mellitus. There are no hypoglycemic associated symptoms. Pertinent negatives for diabetes include no blurred vision and no chest pain. There are no hypoglycemic complications. Diabetic complications include heart disease. Risk factors for coronary artery disease include diabetes mellitus, dyslipidemia, male sex, hypertension, obesity and sedentary lifestyle. He participates in exercise intermittently. His breakfast blood glucose is taken between 8-9 am. His breakfast blood glucose range is generally 90-110 mg/dl. An ACE inhibitor/angiotensin II receptor blocker is being taken. He does not see a podiatrist.Eye exam is current.  Hypertension This is a chronic problem. The current episode started more than 1 year ago. The problem has been gradually improving since onset. The problem is controlled. Pertinent negatives include no blurred vision, chest pain, palpitations or shortness of breath. Risk factors for coronary artery disease include diabetes mellitus, dyslipidemia, obesity, sedentary  lifestyle and male gender. The current treatment provides moderate improvement. Compliance problems include exercise.  Hypertensive end-organ damage includes CAD/MI.     Past Medical History:  Diagnosis Date  . Anxiety   . Coronary artery disease   . Diabetes mellitus without complication ([place])    type 2   . Hypertension   . Hypothyroid   . Obstructive sleep apnea    CPAP  . Pure hypercholesterolemia   . PVD (peripheral vascular disease) ([place])   . Testicular hypofunction   . Vitamin D deficiency      Family History  Problem Relation Age of Onset  . Diabetes type II Mother   . Thyroid disease Mother   . Cancer - Other Father        Liver  . Hypertension Father   . Heart disease Father        Bypass surgery  . Thyroid disease Sister   . Diabetes type II Brother   . Diabetes type II Brother      Current Outpatient Medications:  .  aspirin 81 MG EC tablet, Take 4 tablets (325 mg total) by mouth daily. 1 tablet daily, Disp: , Rfl:  .  atorvastatin (LIPITOR) 80 MG tablet, TAKE 1 TABLET(80 MG) BY MOUTH AT BEDTIME (Patient taking differently: Take 80 mg by mouth daily.), Disp: 90 tablet, Rfl: 2 .  Dapagliflozin-metFORMIN HCl ER (XIGDUO XR) 09-999 MG TB24, Take 1 tablet by mouth daily., Disp: 30 tablet, Rfl: 1 .  levothyroxine (SYNTHROID) 100 MCG tablet, TAKE 1 TABLET(100 MCG) BY MOUTH DAILY, Disp: 90 tablet, Rfl: 0 .  lisinopril-hydrochlorothiazide (ZESTORETIC) 20-12.5 MG tablet, TAKE 1 TABLET BY MOUTH DAILY, Disp: 90 tablet, Rfl: 1 .  metoprolol tartrate (LOPRESSOR)  25 MG tablet, TAKE 1/2 TABLET(12.5 MG) BY MOUTH TWICE DAILY, Disp: 90 tablet, Rfl: 1 .  Multiple Vitamins-Minerals (MULTIVITAMIN WITH MINERALS) tablet, Take 1 tablet by mouth daily., Disp: , Rfl:  .  Saxagliptin-Metformin (KOMBIGLYZE XR) 04-999 MG TB24, Take 1 tablet by mouth daily. TAKE 1 TABLET BY MOUTH EVERY DAY WITH THE EVENING MEAL, Disp: 90 tablet, Rfl: 1 .  sertraline (ZOLOFT) 50 MG tablet, Take 1 tablet (50 mg  total) by mouth daily., Disp: 90 tablet, Rfl: 1   No Known Allergies   Review of Systems  Constitutional: Negative.   Eyes: Negative for blurred vision.  Respiratory: Negative.  Negative for shortness of breath.   Cardiovascular: Negative.  Negative for chest pain and palpitations.  Gastrointestinal: Negative.   Psychiatric/Behavioral: Negative.   All other systems reviewed and are negative.    Today's Vitals   04/02/21 1019  BP: 124/70  Pulse: 68  Temp: 98 F (36.7 C)  TempSrc: Oral  Weight: 289 lb 12.8 oz (131.5 kg)  Height: _0  (1.905 m)   Body mass index is 36.22 kg/m.  Wt Readings from Last 3 Encounters:  04/02/21 289 lb 12.8 oz (131.5 kg)  01/15/21 292 lb (132.5 kg)  01/02/21 295 lb 12.8 oz (134.2 kg)   Objective:  Physical Exam Vitals and nursing note reviewed.  Constitutional:      Appearance: Normal appearance. He is obese.  HENT:     Head: Normocephalic and atraumatic.     Nose:     Comments: Masked     Mouth/Throat:     Comments: Masked  Cardiovascular:     Rate and Rhythm: Normal rate and regular rhythm.     Heart sounds: Normal heart sounds.  Pulmonary:     Effort: Pulmonary effort is normal.     Breath sounds: Normal breath sounds.  Musculoskeletal:     Cervical back: Normal range of motion.  Skin:    General: Skin is warm.  Neurological:     General: No focal deficit present.     Mental Status: He is alert.  Psychiatric:        Mood and Affect: Mood normal.         Assessment And Plan:     1. Uncontrolled type 2 diabetes mellitus with hyperglycemia ([place]) Comments: We discussed changing his Kombiglyze to [place] 09/999 once daily. I will send rx to local pharmacy, he was given savings card. Advised to cut back on his intake of sweetened beverages, including diet drinks.  - Hemoglobin A1c - CMP14+EGFR  2. Benign hypertensive heart disease without heart failure Comments: Chronic, well controlled. He is encouraged to follow a low  sodium diet.  I will check renal function today.   3. Class 2 severe obesity due to excess calories with serious comorbidity and body mass index (BMI) of 36.0 to 36.9 in adult [facility]) Comments: He is encouraged to initially strive for BMI less than 30 to decrease cardiac risk. He is advised to exercise no less than 150 minutes per week.   4. Immunization due I will send rx Shingrix to his local pharmacy.    Patient was given opportunity to ask questions. Patient verbalized understanding of the plan and was able to repeat key elements of the plan. All questions were answered to their satisfaction.   I, [name], MD, have reviewed all documentation for this visit. The documentation on 04/02/21 for the exam, diagnosis, procedures, and orders are all accurate and complete.  IF YOU HAVE BEEN REFERRED TO A SPECIALIST, IT MAY TAKE 1-2 WEEKS TO SCHEDULE/PROCESS THE REFERRAL. IF YOU HAVE NOT HEARD FROM US/SPECIALIST IN TWO WEEKS, PLEASE GIVE [place] A CALL AT [phone].   THE PATIENT IS ENCOURAGED TO PRACTICE SOCIAL DISTANCING DUE TO THE COVID-19 PANDEMIC.

## 2021-04-02 NOTE — Patient Instructions (Signed)
Diabetes Mellitus and Foot Care Foot care is an important part of your health, especially when you have diabetes. Diabetes may cause you to have problems because of poor blood flow (circulation) to your feet and legs, which can cause your skin to:  Become thinner and drier.  Break more easily.  Heal more slowly.  Peel and crack. You may also have nerve damage (neuropathy) in your legs and feet, causing decreased feeling in them. This means that you may not notice minor injuries to your feet that could lead to more serious problems. Noticing and addressing any potential problems early is the best way to prevent future foot problems. How to care for your feet Foot hygiene  Wash your feet daily with warm water and mild soap. Do not use hot water. Then, pat your feet and the areas between your toes until they are completely dry. Do not soak your feet as this can dry your skin.  Trim your toenails straight across. Do not dig under them or around the cuticle. File the edges of your nails with an emery board or nail file.  Apply a moisturizing lotion or petroleum jelly to the skin on your feet and to dry, brittle toenails. Use lotion that does not contain alcohol and is unscented. Do not apply lotion between your toes.   Shoes and socks  Wear clean socks or stockings every day. Make sure they are not too tight. Do not wear knee-high stockings since they may decrease blood flow to your legs.  Wear shoes that fit properly and have enough cushioning. Always look in your shoes before you put them on to be sure there are no objects inside.  To break in new shoes, wear them for just a few hours a day. This prevents injuries on your feet. Wounds, scrapes, corns, and calluses  Check your feet daily for blisters, cuts, bruises, sores, and redness. If you cannot see the bottom of your feet, use a mirror or ask someone for help.  Do not cut corns or calluses or try to remove them with medicine.  If you  find a minor scrape, cut, or break in the skin on your feet, keep it and the skin around it clean and dry. You may clean these areas with mild soap and water. Do not clean the area with peroxide, alcohol, or iodine.  If you have a wound, scrape, corn, or callus on your foot, look at it several times a day to make sure it is healing and not infected. Check for: ? Redness, swelling, or pain. ? Fluid or blood. ? Warmth. ? Pus or a bad smell.   General tips  Do not cross your legs. This may decrease blood flow to your feet.  Do not use heating pads or hot water bottles on your feet. They may burn your skin. If you have lost feeling in your feet or legs, you may not know this is happening until it is too late.  Protect your feet from hot and cold by wearing shoes, such as at the beach or on hot pavement.  Schedule a complete foot exam at least once a year (annually) or more often if you have foot problems. Report any cuts, sores, or bruises to your health care provider immediately. Where to find more information  American Diabetes Association: www.diabetes.org  Association of Diabetes Care & Education Specialists: www.diabeteseducator.org Contact a health care provider if:  You have a medical condition that increases your risk of infection and   you have any cuts, sores, or bruises on your feet.  You have an injury that is not healing.  You have redness on your legs or feet.  You feel burning or tingling in your legs or feet.  You have pain or cramps in your legs and feet.  Your legs or feet are numb.  Your feet always feel cold.  You have pain around any toenails. Get help right away if:  You have a wound, scrape, corn, or callus on your foot and: ? You have pain, swelling, or redness that gets worse. ? You have fluid or blood coming from the wound, scrape, corn, or callus. ? Your wound, scrape, corn, or callus feels warm to the touch. ? You have pus or a bad smell coming from  the wound, scrape, corn, or callus. ? You have a fever. ? You have a red line going up your leg. Summary  Check your feet every day for blisters, cuts, bruises, sores, and redness.  Apply a moisturizing lotion or petroleum jelly to the skin on your feet and to dry, brittle toenails.  Wear shoes that fit properly and have enough cushioning.  If you have foot problems, report any cuts, sores, or bruises to your health care provider immediately.  Schedule a complete foot exam at least once a year (annually) or more often if you have foot problems. This information is not intended to replace advice given to you by your health care provider. Make sure you discuss any questions you have with your health care provider. Document Revised: 06/28/2020 Document Reviewed: 06/28/2020 Elsevier Patient Education  2021 Elsevier Inc.  

## 2021-04-25 ENCOUNTER — Other Ambulatory Visit: Payer: Self-pay | Admitting: Internal Medicine

## 2021-06-23 ENCOUNTER — Other Ambulatory Visit: Payer: Self-pay | Admitting: Internal Medicine

## 2021-06-24 ENCOUNTER — Other Ambulatory Visit: Payer: Self-pay | Admitting: Internal Medicine

## 2021-06-27 ENCOUNTER — Other Ambulatory Visit: Payer: Self-pay | Admitting: Internal Medicine

## 2021-07-27 ENCOUNTER — Other Ambulatory Visit: Payer: Self-pay | Admitting: Internal Medicine

## 2021-08-19 ENCOUNTER — Ambulatory Visit (INDEPENDENT_AMBULATORY_CARE_PROVIDER_SITE_OTHER): Payer: Managed Care, Other (non HMO) | Admitting: Internal Medicine

## 2021-08-19 ENCOUNTER — Other Ambulatory Visit: Payer: Self-pay

## 2021-08-19 ENCOUNTER — Encounter: Payer: Self-pay | Admitting: Internal Medicine

## 2021-08-19 VITALS — BP 124/78 | HR 73 | Temp 98.4°F | Ht 73.0 in | Wt 282.0 lb

## 2021-08-19 DIAGNOSIS — I119 Hypertensive heart disease without heart failure: Secondary | ICD-10-CM | POA: Diagnosis not present

## 2021-08-19 DIAGNOSIS — L989 Disorder of the skin and subcutaneous tissue, unspecified: Secondary | ICD-10-CM

## 2021-08-19 DIAGNOSIS — Z23 Encounter for immunization: Secondary | ICD-10-CM

## 2021-08-19 DIAGNOSIS — E1165 Type 2 diabetes mellitus with hyperglycemia: Secondary | ICD-10-CM

## 2021-08-19 DIAGNOSIS — E039 Hypothyroidism, unspecified: Secondary | ICD-10-CM

## 2021-08-19 DIAGNOSIS — Z0000 Encounter for general adult medical examination without abnormal findings: Secondary | ICD-10-CM | POA: Diagnosis not present

## 2021-08-19 DIAGNOSIS — Z6837 Body mass index (BMI) 37.0-37.9, adult: Secondary | ICD-10-CM

## 2021-08-19 DIAGNOSIS — E66812 Obesity, class 2: Secondary | ICD-10-CM

## 2021-08-19 LAB — POCT URINALYSIS DIPSTICK
Bilirubin, UA: NEGATIVE
Blood, UA: NEGATIVE
Glucose, UA: NEGATIVE
Ketones, UA: NEGATIVE
Leukocytes, UA: NEGATIVE
Nitrite, UA: NEGATIVE
Protein, UA: NEGATIVE
Spec Grav, UA: 1.02 (ref 1.010–1.025)
Urobilinogen, UA: 0.2 E.U./dL
pH, UA: 5.5 (ref 5.0–8.0)

## 2021-08-19 LAB — POCT UA - MICROALBUMIN
Albumin/Creatinine Ratio, Urine, POC: <30
Creatinine, POC: 300 mg/dL
Microalbumin Ur, POC: 30 mg/L

## 2021-08-19 MED ORDER — SHINGRIX 50 MCG/0.5ML IM SUSR: 0.5000 mL | Freq: Once | INTRAMUSCULAR | 0 refills | Status: AC

## 2021-08-19 MED ORDER — XIGDUO XR 10-1000 MG PO TB24: 1.0000 | ORAL_TABLET | Freq: Every day | ORAL | 1 refills | Status: DC

## 2021-08-19 NOTE — Progress Notes (Signed)
I,Katawbba Wiggins,acting as a Education administrator for [name], MD.,have documented all relevant documentation on the behalf of [name], MD,as directed by  [name], MD while in the presence of [name], MD.  This visit occurred during the SARS-CoV-2 public health emergency.  Safety protocols were in place, including screening questions prior to the visit, additional usage of staff PPE, and extensive cleaning of exam room while observing appropriate contact time as indicated for disinfecting solutions.  Subjective:     Patient ID: [name] Hammond , male    DOB: [birth year] , 60 y.o.   MRN: [id number]   Chief Complaint  Patient presents with   Annual Exam   Diabetes   Hypertension     HPI  He is here today for a full physical examination.  He is followed by Urology for his prostate exams. He reports compliance with meds. He denies headaches, chest pain and shortness of breath.   Diabetes He presents for his follow-up diabetic visit. He has type 2 diabetes mellitus. There are no hypoglycemic associated symptoms. Pertinent negatives for diabetes include no blurred vision and no chest pain. There are no hypoglycemic complications. Diabetic complications include heart disease. Risk factors for coronary artery disease include diabetes mellitus, dyslipidemia, male sex, hypertension, obesity and sedentary lifestyle. He is following a diabetic diet. He has had a previous visit with a dietitian. He participates in exercise intermittently. His breakfast blood glucose is taken between 8-9 am. His breakfast blood glucose range is generally 90-110 mg/dl. An ACE inhibitor/angiotensin II receptor blocker is being taken. He does not see a podiatrist.Eye exam is current.  Hypertension This is a chronic problem. The current episode started more than 1 year ago. The problem has been gradually improving since onset. The problem is controlled. Pertinent negatives include no blurred vision, chest  pain, palpitations or shortness of breath. Risk factors for coronary artery disease include diabetes mellitus, dyslipidemia, obesity and sedentary lifestyle. The current treatment provides moderate improvement. Compliance problems include exercise.  Hypertensive end-organ damage includes CAD/MI.    Past Medical History:  Diagnosis Date   Anxiety    Coronary artery disease    Diabetes mellitus without complication ([place])    type 2    Hypertension    Hypothyroid    Obstructive sleep apnea    CPAP   Pure hypercholesterolemia    PVD (peripheral vascular disease) ([place])    Testicular hypofunction    Vitamin D deficiency      Family History  Problem Relation Age of Onset   Diabetes type II Mother    Thyroid disease Mother    Cancer - Other Father        Liver   Hypertension Father    Heart disease Father        Bypass surgery   Thyroid disease Sister    Diabetes type II Brother    Diabetes type II Brother      Current Outpatient Medications:    aspirin 81 MG EC tablet, Take 4 tablets (325 mg total) by mouth daily. 1 tablet daily, Disp: , Rfl:    atorvastatin (LIPITOR) 80 MG tablet, TAKE 1 TABLET(80 MG) BY MOUTH AT BEDTIME (Patient taking differently: Take 80 mg by mouth daily.), Disp: 90 tablet, Rfl: 2   levothyroxine (SYNTHROID) 100 MCG tablet, TAKE 1 TABLET(100 MCG) BY MOUTH DAILY, Disp: 90 tablet, Rfl: 0   lisinopril-hydrochlorothiazide (ZESTORETIC) 20-12.5 MG tablet, TAKE 1 TABLET BY MOUTH DAILY, Disp: 90 tablet, Rfl:  1   metoprolol tartrate (LOPRESSOR) 25 MG tablet, TAKE 1/2 TABLET(12.5 MG) BY MOUTH TWICE DAILY, Disp: 90 tablet, Rfl: 1   Multiple Vitamins-Minerals (MULTIVITAMIN WITH MINERALS) tablet, Take 1 tablet by mouth daily., Disp: , Rfl:    sertraline (ZOLOFT) 50 MG tablet, TAKE 1 TABLET(50 MG) BY MOUTH DAILY, Disp: 90 tablet, Rfl: 1   Dapagliflozin-metFORMIN HCl ER (XIGDUO XR) 09-999 MG TB24, Take 1 tablet by mouth daily., Disp: 30 tablet, Rfl: 1   No Known Allergies    Men's preventive visit. Patient Health Questionnaire (PHQ-2) is  [place] Office Visit from 01/02/2021 in [facility]  PHQ-2 Total Score 0     . Patient is on a diabetic diet. Marital status: Married. Relevant history for alcohol use is:  Social History   Substance and Sexual Activity  Alcohol Use No  . Relevant history for tobacco use is:  Social History   Tobacco Use  Smoking Status Never  Smokeless Tobacco Never  .   Review of Systems  Constitutional: Negative.   HENT: Negative.    Eyes: Negative.  Negative for blurred vision.  Respiratory: Negative.  Negative for shortness of breath.   Cardiovascular: Negative.  Negative for chest pain and palpitations.  Gastrointestinal: Negative.   Endocrine: Negative.   Genitourinary: Negative.   Musculoskeletal: Negative.   Skin:  Positive for rash.       Admits to scratching/picking his skin. Has scattered areas of dark spots on arms/chest. Wants to see dermatologist.   Allergic/Immunologic: Negative.   Neurological: Negative.   Hematological: Negative.   Psychiatric/Behavioral: Negative.      Today's Vitals   08/19/21 1122  BP: 124/78  Pulse: 73  Temp: 98.4 F (36.9 C)  TempSrc: Oral  Weight: 282 lb (127.9 kg)  Height: 6' 1" (1.854 m)   Body mass index is 37.21 kg/m.  Wt Readings from Last 3 Encounters:  08/19/21 282 lb (127.9 kg)  04/02/21 289 lb 12.8 oz (131.5 kg)  01/15/21 292 lb (132.5 kg)    BP Readings from Last 3 Encounters:  08/19/21 124/78  04/02/21 124/70  01/15/21 136/70    Objective:  Physical Exam Vitals and nursing note reviewed.  Constitutional:      Appearance: Normal appearance.  HENT:     Head: Normocephalic and atraumatic.     Right Ear: Tympanic membrane, ear canal and external ear normal.     Left Ear: Tympanic membrane, ear canal and external ear normal.     Nose:     Comments: Masked     Mouth/Throat:     Comments: Masked  Eyes:     Extraocular  Movements: Extraocular movements intact.     Conjunctiva/sclera: Conjunctivae normal.     Pupils: Pupils are equal, round, and reactive to light.  Cardiovascular:     Rate and Rhythm: Normal rate and regular rhythm.     Pulses: Normal pulses.          Dorsalis pedis pulses are 2+ on the right side and 2+ on the left side.     Heart sounds: Normal heart sounds.  Pulmonary:     Effort: Pulmonary effort is normal.     Breath sounds: Normal breath sounds.  Chest:  Breasts:    Right: Normal. No swelling, bleeding, inverted nipple, mass or nipple discharge.     Left: Normal. No swelling, bleeding, inverted nipple, mass or nipple discharge.  Abdominal:     General: Bowel sounds are normal.  Palpations: Abdomen is soft.     Comments: Obese, soft. Difficult to assess organomegaly  Genitourinary:    Comments: deferred Musculoskeletal:        General: Normal range of motion.     Cervical back: Normal range of motion and neck supple.  Feet:     Right foot:     Protective Sensation: 5 sites tested.  5 sites sensed.     Skin integrity: Callus and dry skin present.     Toenail Condition: Right toenails are normal.     Left foot:     Protective Sensation: 5 sites tested.  5 sites sensed.     Skin integrity: Dry skin present.     Toenail Condition: Left toenails are normal.  Skin:    General: Skin is warm.     Comments: There are hyperpigmented, thickened skin lesions on arms, chest and abdomen. No vesicular lesions noted.   Neurological:     General: No focal deficit present.     Mental Status: He is alert.  Psychiatric:        Mood and Affect: Mood normal.        Behavior: Behavior normal.        Assessment And Plan:    1. Routine general medical examination at health care facility Comments: A full exam was performed. DRE deferred. PATIENT IS ADVISED TO GET 30-45 MINUTES REGULAR EXERCISE NO LESS THAN FOUR TO FIVE DAYS PER WEEK - BOTH WEIGHTBEARING EXERCISES AND AEROBIC ARE  RECOMMENDED.  PATIENT IS ADVISED TO FOLLOW A HEALTHY DIET WITH AT LEAST SIX FRUITS/VEGGIES PER DAY, DECREASE INTAKE OF RED MEAT, AND TO INCREASE FISH INTAKE TO TWO DAYS PER WEEK.  MEATS/FISH SHOULD NOT BE FRIED, BAKED OR BROILED IS PREFERABLE.  IT IS ALSO IMPORTANT TO CUT BACK ON YOUR SUGAR INTAKE. PLEASE AVOID ANYTHING WITH ADDED SUGAR, CORN SYRUP OR OTHER SWEETENERS. IF YOU MUST USE A SWEETENER, YOU CAN TRY STEVIA. IT IS ALSO IMPORTANT TO AVOID ARTIFICIALLY SWEETENERS AND DIET BEVERAGES. LASTLY, I SUGGEST WEARING SPF 50 SUNSCREEN ON EXPOSED PARTS AND ESPECIALLY WHEN IN THE DIRECT SUNLIGHT FOR AN EXTENDED PERIOD OF TIME.  PLEASE AVOID FAST FOOD RESTAURANTS AND INCREASE YOUR WATER INTAKE.  - Hemoglobin A1c - CBC - CMP14+EGFR - Lipid panel - TSH + free T4 - PSA  2. Uncontrolled type 2 diabetes mellitus with hyperglycemia ([place]) Comments: Diabetic foot exam was performed. I plan to switch to Xigduo 10/1000mg once daily. I DISCUSSED WITH THE PATIENT AT LENGTH REGARDING THE GOALS OF GLYCEMIC CONTROL AND POSSIBLE LONG-TERM COMPLICATIONS.  I  ALSO STRESSED THE IMPORTANCE OF COMPLIANCE WITH HOME GLUCOSE MONITORING, DIETARY RESTRICTIONS INCLUDING AVOIDANCE OF SUGARY DRINKS/PROCESSED FOODS,  ALONG WITH REGULAR EXERCISE.  I  ALSO STRESSED THE IMPORTANCE OF ANNUAL EYE EXAMS, SELF FOOT CARE AND COMPLIANCE WITH OFFICE VISITS.  - POCT Urinalysis Dipstick (81002) - POCT UA - Microalbumin  3. Benign hypertensive heart disease without heart failure Comments: Chronic, well controlled. EKG performed, NSR w/o acute changes. He is encouraged to follow low sodium diet.  - EKG 12-Lead  4. Primary hypothyroidism Comments: I will check thyroid panel and adjust meds as needed.   5. Skin lesion of right arm Comments: I will refer to Derm as requested.  - Ambulatory referral to Dermatology  6. Class 2 severe obesity due to excess calories with serious comorbidity and body mass index (BMI) of 37.0 to 37.9 in adult  [facility]) Comments: He is encouraged to initially strive for BMI less than  30 to decrease cardiac risk. He is advised to exercise no less than 150 minutes per week.    7. Immunization due Comments: I will send rx Shingrix to his local pharmacy.   Patient was given opportunity to ask questions. Patient verbalized understanding of the plan and was able to repeat key elements of the plan. All questions were answered to their satisfaction.   I, [name], MD, have reviewed all documentation for this visit. The documentation on 08/19/21 for the exam, diagnosis, procedures, and orders are all accurate and complete.   THE PATIENT IS ENCOURAGED TO PRACTICE SOCIAL DISTANCING DUE TO THE COVID-19 PANDEMIC.

## 2021-08-19 NOTE — Patient Instructions (Signed)
Health Maintenance, Male Adopting a healthy lifestyle and getting preventive care are important in promoting health and wellness. Ask your health care provider about: The right schedule for you to have regular tests and exams. Things you can do on your own to prevent diseases and keep yourself healthy. What should I know about diet, weight, and exercise? Eat a healthy diet  Eat a diet that includes plenty of vegetables, fruits, low-fat dairy products, and lean protein. Do not eat a lot of foods that are high in solid fats, added sugars, or sodium.  Maintain a healthy weight Body mass index (BMI) is a measurement that can be used to identify possible weight problems. It estimates body fat based on height and weight. Your health care provider can help determine your BMI and help you achieve or maintain ahealthy weight. Get regular exercise Get regular exercise. This is one of the most important things you can do for your health. Most adults should: Exercise for at least 150 minutes each week. The exercise should increase your heart rate and make you sweat (moderate-intensity exercise). Do strengthening exercises at least twice a week. This is in addition to the moderate-intensity exercise. Spend less time sitting. Even light physical activity can be beneficial. Watch cholesterol and blood lipids Have your blood tested for lipids and cholesterol at 60 years of age, then havethis test every 5 years. You may need to have your cholesterol levels checked more often if: Your lipid or cholesterol levels are high. You are older than 60 years of age. You are at high risk for heart disease. What should I know about cancer screening? Many types of cancers can be detected early and may often be prevented. Depending on your health history and family history, you may need to have cancer screening at various ages. This may include screening for: Colorectal cancer. Prostate cancer. Skin cancer. Lung  cancer. What should I know about heart disease, diabetes, and high blood pressure? Blood pressure and heart disease High blood pressure causes heart disease and increases the risk of stroke. This is more likely to develop in people who have high blood pressure readings, are of African descent, or are overweight. Talk with your health care provider about your target blood pressure readings. Have your blood pressure checked: Every 3-5 years if you are 18-39 years of age. Every year if you are 40 years old or older. If you are between the ages of 65 and 75 and are a current or former smoker, ask your health care provider if you should have a one-time screening for abdominal aortic aneurysm (AAA). Diabetes Have regular diabetes screenings. This checks your fasting blood sugar level. Have the screening done: Once every three years after age 45 if you are at a normal weight and have a low risk for diabetes. More often and at a younger age if you are overweight or have a high risk for diabetes. What should I know about preventing infection? Hepatitis B If you have a higher risk for hepatitis B, you should be screened for this virus. Talk with your health care provider to find out if you are at risk forhepatitis B infection. Hepatitis C Blood testing is recommended for: Everyone born from 1945 through 1965. Anyone with known risk factors for hepatitis C. Sexually transmitted infections (STIs) You should be screened each year for STIs, including gonorrhea and chlamydia, if: You are sexually active and are younger than 60 years of age. You are older than 60 years of age   and your health care provider tells you that you are at risk for this type of infection. Your sexual activity has changed since you were last screened, and you are at increased risk for chlamydia or gonorrhea. Ask your health care provider if you are at risk. Ask your health care provider about whether you are at high risk for HIV.  Your health care provider may recommend a prescription medicine to help prevent HIV infection. If you choose to take medicine to prevent HIV, you should first get tested for HIV. You should then be tested every 3 months for as long as you are taking the medicine. Follow these instructions at home: Lifestyle Do not use any products that contain nicotine or tobacco, such as cigarettes, e-cigarettes, and chewing tobacco. If you need help quitting, ask your health care provider. Do not use street drugs. Do not share needles. Ask your health care provider for help if you need support or information about quitting drugs. Alcohol use Do not drink alcohol if your health care provider tells you not to drink. If you drink alcohol: Limit how much you have to 0-2 drinks a day. Be aware of how much alcohol is in your drink. In the [place] one drink equals one 12 oz bottle of beer (355 mL), one 5 oz glass of wine (148 mL), or one 1 oz glass of hard liquor (44 mL). General instructions Schedule regular health, dental, and eye exams. Stay current with your vaccines. Tell your health care provider if: You often feel depressed. You have ever been abused or do not feel safe at home. Summary Adopting a healthy lifestyle and getting preventive care are important in promoting health and wellness. Follow your health care provider's instructions about healthy diet, exercising, and getting tested or screened for diseases. Follow your health care provider's instructions on monitoring your cholesterol and blood pressure. This information is not intended to replace advice given to you by your health care provider. Make sure you discuss any questions you have with your healthcare provider. Document Revised: 12/01/2018 Document Reviewed: 12/01/2018 Elsevier Patient Education  2022 Elsevier Inc.  

## 2021-08-20 LAB — CMP14+EGFR
ALT: 30 IU/L (ref 0–44)
AST: 23 IU/L (ref 0–40)
Albumin/Globulin Ratio: 1.5 (ref 1.2–2.2)
Albumin: 4.4 g/dL (ref 3.8–4.9)
Alkaline Phosphatase: 118 IU/L (ref 44–121)
BUN/Creatinine Ratio: 11 (ref 10–24)
BUN: 9 mg/dL (ref 8–27)
Bilirubin Total: 0.3 mg/dL (ref 0.0–1.2)
CO2: 23 mmol/L (ref 20–29)
Calcium: 9.5 mg/dL (ref 8.6–10.2)
Chloride: 99 mmol/L (ref 96–106)
Creatinine, Ser: 0.83 mg/dL (ref 0.76–1.27)
Globulin, Total: 3 g/dL (ref 1.5–4.5)
Glucose: 118 mg/dL — ABNORMAL HIGH (ref 65–99)
Potassium: 4.1 mmol/L (ref 3.5–5.2)
Sodium: 138 mmol/L (ref 134–144)
Total Protein: 7.4 g/dL (ref 6.0–8.5)
eGFR: 100 mL/min/{1.73_m2} (ref >59)

## 2021-08-20 LAB — LIPID PANEL
Chol/HDL Ratio: 2.7 ratio (ref 0.0–5.0)
Cholesterol, Total: 118 mg/dL (ref 100–199)
HDL: 43 mg/dL (ref >39)
LDL Chol Calc (NIH): 60 mg/dL (ref 0–99)
Triglycerides: 73 mg/dL (ref 0–149)
VLDL Cholesterol Cal: 15 mg/dL (ref 5–40)

## 2021-08-20 LAB — CBC
Hematocrit: 45.6 % (ref 37.5–51.0)
Hemoglobin: 14.9 g/dL (ref 13.0–17.7)
MCH: 28 pg (ref 26.6–33.0)
MCHC: 32.7 g/dL (ref 31.5–35.7)
MCV: 86 fL (ref 79–97)
Platelets: 293 10*3/uL (ref 150–450)
RBC: 5.32 x10E6/uL (ref 4.14–5.80)
RDW: 14.4 % (ref 11.6–15.4)
WBC: 8.9 10*3/uL (ref 3.4–10.8)

## 2021-08-20 LAB — HEMOGLOBIN A1C
Est. average glucose Bld gHb Est-mCnc: 134 mg/dL
Hgb A1c MFr Bld: 6.3 % — ABNORMAL HIGH (ref 4.8–5.6)

## 2021-08-20 LAB — TSH+FREE T4
Free T4: 1.6 ng/dL (ref 0.82–1.77)
TSH: 1.36 u[IU]/mL (ref 0.450–4.500)

## 2021-08-20 LAB — PSA: Prostate Specific Ag, Serum: 0.4 ng/mL (ref 0.0–4.0)

## 2021-09-05 ENCOUNTER — Other Ambulatory Visit: Payer: Self-pay | Admitting: Internal Medicine

## 2021-09-24 ENCOUNTER — Other Ambulatory Visit: Payer: Self-pay | Admitting: Internal Medicine

## 2021-09-28 ENCOUNTER — Other Ambulatory Visit: Payer: Self-pay | Admitting: Internal Medicine

## 2021-10-24 ENCOUNTER — Other Ambulatory Visit: Payer: Self-pay | Admitting: Internal Medicine

## 2021-11-05 ENCOUNTER — Telehealth: Payer: Self-pay

## 2021-11-05 ENCOUNTER — Other Ambulatory Visit: Payer: Self-pay

## 2021-11-05 MED ORDER — XIGDUO XR 10-1000 MG PO TB24: 1.0000 | ORAL_TABLET | Freq: Every day | ORAL | 1 refills | Status: DC

## 2021-11-05 NOTE — Telephone Encounter (Signed)
I left the pt a message that Dr. Allyne Gee wanted to know if the pt is taking his xigduo xr as prescribed because she received a letter from [facility] that based off of the pt's refill history he may not be compliant with the medication.

## 2021-11-07 ENCOUNTER — Other Ambulatory Visit: Payer: Self-pay

## 2021-11-07 MED ORDER — SERTRALINE HCL 50 MG PO TABS: ORAL_TABLET | ORAL | 1 refills | Status: DC

## 2021-12-12 ENCOUNTER — Encounter: Payer: Self-pay | Admitting: Internal Medicine

## 2021-12-12 LAB — HM DIABETES EYE EXAM

## 2021-12-25 ENCOUNTER — Ambulatory Visit: Payer: Managed Care, Other (non HMO) | Admitting: Nurse Practitioner

## 2022-01-02 ENCOUNTER — Other Ambulatory Visit: Payer: Self-pay | Admitting: Internal Medicine

## 2022-01-15 ENCOUNTER — Other Ambulatory Visit: Payer: Self-pay

## 2022-01-15 ENCOUNTER — Ambulatory Visit (INDEPENDENT_AMBULATORY_CARE_PROVIDER_SITE_OTHER): Payer: Managed Care, Other (non HMO) | Admitting: Internal Medicine

## 2022-01-15 ENCOUNTER — Encounter: Payer: Self-pay | Admitting: Internal Medicine

## 2022-01-15 VITALS — BP 132/78 | HR 87 | Temp 98.3°F | Ht 73.0 in | Wt 274.6 lb

## 2022-01-15 DIAGNOSIS — E1165 Type 2 diabetes mellitus with hyperglycemia: Secondary | ICD-10-CM | POA: Diagnosis not present

## 2022-01-15 DIAGNOSIS — I119 Hypertensive heart disease without heart failure: Secondary | ICD-10-CM

## 2022-01-15 DIAGNOSIS — Z23 Encounter for immunization: Secondary | ICD-10-CM

## 2022-01-15 DIAGNOSIS — E039 Hypothyroidism, unspecified: Secondary | ICD-10-CM

## 2022-01-15 DIAGNOSIS — I2510 Atherosclerotic heart disease of native coronary artery without angina pectoris: Secondary | ICD-10-CM

## 2022-01-15 DIAGNOSIS — Z6836 Body mass index (BMI) 36.0-36.9, adult: Secondary | ICD-10-CM

## 2022-01-15 NOTE — Patient Instructions (Signed)

## 2022-01-15 NOTE — Progress Notes (Signed)
[name] Hammond,acting as a Education administrator for [name] Greenland, MD.,have documented all relevant documentation on the behalf of [name] Greenland, MD,as directed by  [name] Greenland, MD while in the presence of [name] Greenland, MD.  This visit occurred during the SARS-CoV-2 public health emergency.  Safety protocols were in place, including screening questions prior to the visit, additional usage of staff PPE, and extensive cleaning of exam room while observing appropriate contact time as indicated for disinfecting solutions.  Subjective:     Patient ID: [name] Hammond , male    DOB: 1961/08/10 , 61 y.o.   MRN: [id number]   Chief Complaint  Patient presents with   Diabetes    HPI  The patient is here today for a diabetes, blood pressure f/u. He reports compliance with meds.  He is now taking Xigduo 09/999 instead of Kombiglyze. He states he does have urinary frequency. He denies dysuria.  He denies chest pain, palpitations and headaches.   Diabetes He presents for his follow-up diabetic visit. He has type 2 diabetes mellitus. There are no hypoglycemic associated symptoms. Pertinent negatives for diabetes include no blurred vision and no chest pain. There are no hypoglycemic complications. Diabetic complications include heart disease. Risk factors for coronary artery disease include diabetes mellitus, dyslipidemia, male sex, hypertension, obesity and sedentary lifestyle. He participates in exercise intermittently. His breakfast blood glucose is taken between 8-9 am. His breakfast blood glucose range is generally 90-110 mg/dl. An ACE inhibitor/angiotensin II receptor blocker is being taken. He does not see a podiatrist.Eye exam is current.  Hypertension This is a chronic problem. The current episode started more than 1 year ago. The problem has been gradually improving since onset. The problem is controlled. Pertinent negatives include no blurred vision, chest pain, palpitations or shortness of  breath. Risk factors for coronary artery disease include diabetes mellitus, dyslipidemia, obesity, sedentary lifestyle and male gender. The current treatment provides moderate improvement. Compliance problems include exercise.  Hypertensive end-organ damage includes CAD/MI.    Past Medical History:  Diagnosis Date   Anxiety    Coronary artery disease    Diabetes mellitus without complication ([place])    type 2    Hypertension    Hypothyroid    Obstructive sleep apnea    CPAP   Pure hypercholesterolemia    PVD (peripheral vascular disease) ([place])    Testicular hypofunction    Vitamin D deficiency      Family History  Problem Relation Age of Onset   Diabetes type II Mother    Thyroid disease Mother    Cancer - Other Father        Liver   Hypertension Father    Heart disease Father        Bypass surgery   Thyroid disease Sister    Diabetes type II Brother    Diabetes type II Brother      Current Outpatient Medications:    aspirin 81 MG EC tablet, Take 4 tablets (325 mg total) by mouth daily. 1 tablet daily, Disp: , Rfl:    atorvastatin (LIPITOR) 80 MG tablet, TAKE 1 TABLET(80 MG) BY MOUTH AT BEDTIME, Disp: 90 tablet, Rfl: 2   Dapagliflozin-metFORMIN HCl ER (XIGDUO XR) 09-999 MG TB24, Take 1 tablet by mouth daily., Disp: 30 tablet, Rfl: 1   levothyroxine (SYNTHROID) 100 MCG tablet, TAKE 1 TABLET(100 MCG) BY MOUTH DAILY, Disp: 90 tablet, Rfl: 0   lisinopril-hydrochlorothiazide (ZESTORETIC) 20-12.5 MG tablet, TAKE 1 TABLET BY MOUTH DAILY, Disp:  90 tablet, Rfl: 1   metoprolol tartrate (LOPRESSOR) 25 MG tablet, TAKE 1/2 TABLET(12.5 MG) BY MOUTH TWICE DAILY, Disp: 90 tablet, Rfl: 1   Multiple Vitamins-Minerals (MULTIVITAMIN WITH MINERALS) tablet, Take 1 tablet by mouth daily., Disp: , Rfl:    sertraline (ZOLOFT) 50 MG tablet, TAKE 1 TABLET(50 MG) BY MOUTH DAILY, Disp: 90 tablet, Rfl: 1   No Known Allergies   Review of Systems  Constitutional: Negative.   Eyes:  Negative for blurred  vision.  Respiratory: Negative.  Negative for shortness of breath.   Cardiovascular: Negative.  Negative for chest pain and palpitations.  Gastrointestinal: Negative.   Neurological: Negative.   Psychiatric/Behavioral: Negative.      Today's Vitals   01/15/22 1630  BP: 132/78  Pulse: 87  Temp: 98.3 F (36.8 C)  Weight: 274 lb 9.6 oz (124.6 kg)  Height: 6' 1"  (1.854 m)  PainSc: 0-No pain   Body mass index is 36.23 kg/m.  Wt Readings from Last 3 Encounters:  01/15/22 274 lb 9.6 oz (124.6 kg)  08/19/21 282 lb (127.9 kg)  04/02/21 289 lb 12.8 oz (131.5 kg)     Objective:  Physical Exam Vitals and nursing note reviewed.  Constitutional:      Appearance: Normal appearance. He is obese.  HENT:     Head: Normocephalic and atraumatic.     Nose:     Comments: Masked     Mouth/Throat:     Comments: Masked  Eyes:     Extraocular Movements: Extraocular movements intact.  Cardiovascular:     Rate and Rhythm: Normal rate and regular rhythm.     Heart sounds: Normal heart sounds.  Pulmonary:     Effort: Pulmonary effort is normal.     Breath sounds: Normal breath sounds.  Musculoskeletal:     Cervical back: Normal range of motion.  Skin:    General: Skin is warm.  Neurological:     General: No focal deficit present.     Mental Status: He is alert.  Psychiatric:        Mood and Affect: Mood normal.        Assessment And Plan:     1. Uncontrolled type 2 diabetes mellitus with hyperglycemia (HCC) Comments: Chronic, I will check labs as below. He will rto in 4 months for re-evaluation. He is encouraged to resume his regular exercise routine.  - CMP14+EGFR - Hemoglobin A1c  2. Benign hypertensive heart disease without heart failure Comments: Chronic, fair control. Goal BP <130/80. NO med changes. He is encouraged to follow heart healthy diet.   3. Coronary artery disease involving native coronary artery of native heart without angina pectoris Comments: Chronic, he is s/p  CABG. He is encouraged to follow heart healthy lifestyle. He is on Statin, B-blocker and ASA.   4. Primary hypothyroidism Comments: I will check TSH and adjust meds as needed.  - TSH  5. Class 2 severe obesity due to excess calories with serious comorbidity and body mass index (BMI) of 36.0 to 36.9 in adult [facility]) Comments: He was congratulated on his 8 lb weight loss and encouraged to keep up the great work. He is encouraged to aim for BMI < 30 to decrease cardiac risk.   6. Immunization due Comments: He was given flu vaccine.  - Flu Vaccine QUAD 6+ mos PF IM (Fluarix Quad PF)   Patient was given opportunity to ask questions. Patient verbalized understanding of the plan and was able to repeat key elements of the  plan. All questions were answered to their satisfaction.   I, [name] Greenland, MD, have reviewed all documentation for this visit. The documentation on 01/15/22 for the exam, diagnosis, procedures, and orders are all accurate and complete.   IF YOU HAVE BEEN REFERRED TO A SPECIALIST, IT MAY TAKE 1-2 WEEKS TO SCHEDULE/PROCESS THE REFERRAL. IF YOU HAVE NOT HEARD FROM US/SPECIALIST IN TWO WEEKS, PLEASE GIVE [place] A CALL AT [phone].   THE PATIENT IS ENCOURAGED TO PRACTICE SOCIAL DISTANCING DUE TO THE COVID-19 PANDEMIC.

## 2022-01-16 LAB — CMP14+EGFR
ALT: 22 IU/L (ref 0–44)
AST: 21 IU/L (ref 0–40)
Albumin/Globulin Ratio: 1.3 (ref 1.2–2.2)
Albumin: 4.3 g/dL (ref 3.8–4.9)
Alkaline Phosphatase: 121 IU/L (ref 44–121)
BUN/Creatinine Ratio: 15 (ref 10–24)
BUN: 13 mg/dL (ref 8–27)
Bilirubin Total: 0.6 mg/dL (ref 0.0–1.2)
CO2: 23 mmol/L (ref 20–29)
Calcium: 9.4 mg/dL (ref 8.6–10.2)
Chloride: 101 mmol/L (ref 96–106)
Creatinine, Ser: 0.89 mg/dL (ref 0.76–1.27)
Globulin, Total: 3.3 g/dL (ref 1.5–4.5)
Glucose: 155 mg/dL — ABNORMAL HIGH (ref 70–99)
Potassium: 4.4 mmol/L (ref 3.5–5.2)
Sodium: 139 mmol/L (ref 134–144)
Total Protein: 7.6 g/dL (ref 6.0–8.5)
eGFR: 98 mL/min/{1.73_m2} (ref >59)

## 2022-01-16 LAB — TSH: TSH: 0.95 u[IU]/mL (ref 0.450–4.500)

## 2022-01-16 LAB — HEMOGLOBIN A1C
Est. average glucose Bld gHb Est-mCnc: 140 mg/dL
Hgb A1c MFr Bld: 6.5 % — ABNORMAL HIGH (ref 4.8–5.6)

## 2022-01-28 ENCOUNTER — Other Ambulatory Visit: Payer: Self-pay | Admitting: Internal Medicine

## 2022-01-30 ENCOUNTER — Other Ambulatory Visit: Payer: Self-pay

## 2022-01-30 MED ORDER — SERTRALINE HCL 50 MG PO TABS: ORAL_TABLET | ORAL | 1 refills | Status: DC

## 2022-01-30 MED ORDER — METOPROLOL TARTRATE 25 MG PO TABS: ORAL_TABLET | ORAL | 1 refills | Status: DC

## 2022-02-11 ENCOUNTER — Other Ambulatory Visit: Payer: Self-pay

## 2022-02-11 ENCOUNTER — Ambulatory Visit (INDEPENDENT_AMBULATORY_CARE_PROVIDER_SITE_OTHER): Payer: Managed Care, Other (non HMO)

## 2022-02-11 VITALS — BP 124/78 | HR 66 | Temp 98.2°F | Ht 73.0 in | Wt 273.6 lb

## 2022-02-11 DIAGNOSIS — Z23 Encounter for immunization: Secondary | ICD-10-CM | POA: Diagnosis not present

## 2022-02-11 NOTE — Progress Notes (Signed)
The patient is here today for his 2nd Shingrix Vaccination.

## 2022-03-21 ENCOUNTER — Other Ambulatory Visit: Payer: Self-pay | Admitting: Internal Medicine

## 2022-04-17 ENCOUNTER — Other Ambulatory Visit: Payer: Self-pay | Admitting: Internal Medicine

## 2022-04-28 ENCOUNTER — Other Ambulatory Visit: Payer: Self-pay | Admitting: Internal Medicine

## 2022-05-21 ENCOUNTER — Ambulatory Visit (INDEPENDENT_AMBULATORY_CARE_PROVIDER_SITE_OTHER): Payer: Managed Care, Other (non HMO) | Admitting: Internal Medicine

## 2022-05-21 ENCOUNTER — Encounter: Payer: Self-pay | Admitting: Internal Medicine

## 2022-05-21 VITALS — BP 124/80 | HR 61 | Temp 97.9°F | Ht 73.0 in | Wt 265.2 lb

## 2022-05-21 DIAGNOSIS — E6609 Other obesity due to excess calories: Secondary | ICD-10-CM

## 2022-05-21 DIAGNOSIS — E785 Hyperlipidemia, unspecified: Secondary | ICD-10-CM

## 2022-05-21 DIAGNOSIS — E1169 Type 2 diabetes mellitus with other specified complication: Secondary | ICD-10-CM

## 2022-05-21 DIAGNOSIS — I2510 Atherosclerotic heart disease of native coronary artery without angina pectoris: Secondary | ICD-10-CM

## 2022-05-21 DIAGNOSIS — I119 Hypertensive heart disease without heart failure: Secondary | ICD-10-CM

## 2022-05-21 DIAGNOSIS — E039 Hypothyroidism, unspecified: Secondary | ICD-10-CM

## 2022-05-21 DIAGNOSIS — Z23 Encounter for immunization: Secondary | ICD-10-CM | POA: Diagnosis not present

## 2022-05-21 DIAGNOSIS — Z6834 Body mass index (BMI) 34.0-34.9, adult: Secondary | ICD-10-CM

## 2022-05-21 DIAGNOSIS — E1165 Type 2 diabetes mellitus with hyperglycemia: Secondary | ICD-10-CM

## 2022-05-21 DIAGNOSIS — E66811 Obesity, class 1: Secondary | ICD-10-CM

## 2022-05-21 NOTE — Patient Instructions (Signed)

## 2022-05-21 NOTE — Progress Notes (Signed)
[name],acting as a Education administrator for [name], MD.,have documented all relevant documentation on the behalf of [name], MD,as directed by  [name], MD while in the presence of [name], MD.  This visit occurred during the SARS-CoV-2 public health emergency.  Safety protocols were in place, including screening questions prior to the visit, additional usage of staff PPE, and extensive cleaning of exam room while observing appropriate contact time as indicated for disinfecting solutions.  Subjective:     Patient ID: [name] Hammond , male    DOB: 09-26-1961 , 61 y.o.   MRN: [id number]   Chief Complaint  Patient presents with  . Diabetes  . Hypertension    HPI  The patient is here today for a diabetes, blood pressure f/u. He reports compliance with meds.  He denies headaches, chest pain and shortness of breath.   Diabetes He presents for his follow-up diabetic visit. He has type 2 diabetes mellitus. There are no hypoglycemic associated symptoms. Pertinent negatives for diabetes include no blurred vision, no chest pain, no polydipsia, no polyphagia and no polyuria. There are no hypoglycemic complications. Diabetic complications include heart disease. Risk factors for coronary artery disease include diabetes mellitus, dyslipidemia, male sex, hypertension, obesity and sedentary lifestyle. He participates in exercise intermittently. His breakfast blood glucose is taken between 8-9 am. His breakfast blood glucose range is generally 90-110 mg/dl. An ACE inhibitor/angiotensin II receptor blocker is being taken. He does not see a podiatrist.Eye exam is current.  Hypertension This is a chronic problem. The current episode started more than 1 year ago. The problem has been gradually improving since onset. The problem is controlled. Pertinent negatives include no blurred vision, chest pain, palpitations or shortness of breath. Risk factors for coronary artery disease  include diabetes mellitus, dyslipidemia, obesity, sedentary lifestyle and male gender. The current treatment provides moderate improvement. Compliance problems include exercise.  Hypertensive end-organ damage includes CAD/MI.    Past Medical History:  Diagnosis Date  . Anxiety   . Coronary artery disease   . Diabetes mellitus without complication ([place])    type 2   . Hypertension   . Hypothyroid   . Obstructive sleep apnea    CPAP  . Pure hypercholesterolemia   . PVD (peripheral vascular disease) ([place])   . Testicular hypofunction   . Vitamin D deficiency      Family History  Problem Relation Age of Onset  . Diabetes type II Mother   . Thyroid disease Mother   . Cancer - Other Father        Liver  . Hypertension Father   . Heart disease Father        Bypass surgery  . Thyroid disease Sister   . Diabetes type II Brother   . Diabetes type II Brother      Current Outpatient Medications:  .  aspirin 81 MG EC tablet, Take 4 tablets (325 mg total) by mouth daily. 1 tablet daily, Disp: , Rfl:  .  atorvastatin (LIPITOR) 80 MG tablet, TAKE 1 TABLET(80 MG) BY MOUTH AT BEDTIME, Disp: 90 tablet, Rfl: 2 .  levothyroxine (SYNTHROID) 100 MCG tablet, TAKE 1 TABLET(100 MCG) BY MOUTH DAILY, Disp: 90 tablet, Rfl: 0 .  lisinopril-hydrochlorothiazide (ZESTORETIC) 20-12.5 MG tablet, TAKE 1 TABLET BY MOUTH DAILY, Disp: 90 tablet, Rfl: 1 .  metoprolol tartrate (LOPRESSOR) 25 MG tablet, TAKE 1/2 TABLET(12.5 MG) BY MOUTH TWICE DAILY, Disp: 90 tablet, Rfl: 1 .  sertraline (ZOLOFT) 50  MG tablet, TAKE 1 TABLET(50 MG) BY MOUTH DAILY, Disp: 90 tablet, Rfl: 1 .  XIGDUO XR 09-999 MG TB24, TAKE 1 TABLET BY MOUTH DAILY, Disp: 30 tablet, Rfl: 1 .  Multiple Vitamins-Minerals (MULTIVITAMIN WITH MINERALS) tablet, Take 1 tablet by mouth daily. (Patient not taking: Reported on 05/21/2022), Disp: , Rfl:    No Known Allergies   Review of Systems  Constitutional: Negative.   Eyes:  Negative for blurred vision.   Respiratory: Negative.  Negative for shortness of breath.   Cardiovascular: Negative.  Negative for chest pain and palpitations.  Gastrointestinal: Negative.   Endocrine: Negative for polydipsia, polyphagia and polyuria.  Neurological: Negative.   Psychiatric/Behavioral: Negative.      Today's Vitals   05/21/22 1441  BP: 124/80  Pulse: 61  Temp: 97.9 F (36.6 C)  Weight: 265 lb 3.2 oz (120.3 kg)  Height: 6' 1"  (1.854 m)  PainSc: 0-No pain   Body mass index is 34.99 kg/m.  Wt Readings from Last 3 Encounters:  05/21/22 265 lb 3.2 oz (120.3 kg)  02/11/22 273 lb 9.6 oz (124.1 kg)  01/15/22 274 lb 9.6 oz (124.6 kg)     Objective:  Physical Exam Vitals and nursing note reviewed.  Constitutional:      Appearance: Normal appearance.  Eyes:     Extraocular Movements: Extraocular movements intact.  Cardiovascular:     Rate and Rhythm: Normal rate and regular rhythm.     Heart sounds: Normal heart sounds.  Pulmonary:     Effort: Pulmonary effort is normal.     Breath sounds: Normal breath sounds.  Musculoskeletal:     Cervical back: Normal range of motion.  Skin:    General: Skin is warm.  Neurological:     General: No focal deficit present.     Mental Status: He is alert.  Psychiatric:        Mood and Affect: Mood normal.      Assessment And Plan:     1. Uncontrolled type 2 diabetes mellitus with hyperglycemia (HCC) - BMP8+eGFR - Hemoglobin A1c  2. Benign hypertensive heart disease without heart failure  3. Primary hypothyroidism Comments: I will check a TSh today and adjust meds as needed.  - TSH  4. Class 1 obesity due to excess calories with serious comorbidity and body mass index (BMI) of 34.0 to 34.9 in adult Comments: He was congratulated on his 8lb weight loss. He is encouraged to aim for at least 150 minutes of exercise per week.   5. Immunization due - Tdap vaccine greater than or equal to 7yo IM   Patient was given opportunity to ask questions.  Patient verbalized understanding of the plan and was able to repeat key elements of the plan. All questions were answered to their satisfaction.   I, [name], MD, have reviewed all documentation for this visit. The documentation on 05/21/22 for the exam, diagnosis, procedures, and orders are all accurate and complete.   IF YOU HAVE BEEN REFERRED TO A SPECIALIST, IT MAY TAKE 1-2 WEEKS TO SCHEDULE/PROCESS THE REFERRAL. IF YOU HAVE NOT HEARD FROM US/SPECIALIST IN TWO WEEKS, PLEASE GIVE [place] A CALL AT [phone] X [phone].   THE PATIENT IS ENCOURAGED TO PRACTICE SOCIAL DISTANCING DUE TO THE COVID-19 PANDEMIC.

## 2022-05-22 LAB — BMP8+EGFR
BUN/Creatinine Ratio: 14 (ref 10–24)
BUN: 13 mg/dL (ref 8–27)
CO2: 23 mmol/L (ref 20–29)
Calcium: 9.7 mg/dL (ref 8.6–10.2)
Chloride: 98 mmol/L (ref 96–106)
Creatinine, Ser: 0.93 mg/dL (ref 0.76–1.27)
Glucose: 116 mg/dL — ABNORMAL HIGH (ref 70–99)
Potassium: 4.4 mmol/L (ref 3.5–5.2)
Sodium: 136 mmol/L (ref 134–144)
eGFR: 93 mL/min/{1.73_m2} (ref >59)

## 2022-05-22 LAB — TSH: TSH: 1.9 u[IU]/mL (ref 0.450–4.500)

## 2022-05-22 LAB — HEMOGLOBIN A1C
Est. average glucose Bld gHb Est-mCnc: 137 mg/dL
Hgb A1c MFr Bld: 6.4 % — ABNORMAL HIGH (ref 4.8–5.6)

## 2022-06-07 ENCOUNTER — Other Ambulatory Visit: Payer: Self-pay | Admitting: Internal Medicine

## 2022-06-14 ENCOUNTER — Other Ambulatory Visit: Payer: Self-pay | Admitting: Internal Medicine

## 2022-06-28 ENCOUNTER — Other Ambulatory Visit: Payer: Self-pay | Admitting: Internal Medicine

## 2022-07-25 ENCOUNTER — Other Ambulatory Visit: Payer: Self-pay | Admitting: Internal Medicine

## 2022-07-26 ENCOUNTER — Other Ambulatory Visit: Payer: Self-pay | Admitting: Internal Medicine

## 2022-07-27 ENCOUNTER — Other Ambulatory Visit: Payer: Self-pay | Admitting: Internal Medicine

## 2022-07-30 ENCOUNTER — Encounter (INDEPENDENT_AMBULATORY_CARE_PROVIDER_SITE_OTHER): Payer: Self-pay

## 2022-09-01 ENCOUNTER — Encounter: Payer: Managed Care, Other (non HMO) | Admitting: Internal Medicine

## 2022-09-09 ENCOUNTER — Encounter: Payer: Managed Care, Other (non HMO) | Admitting: Internal Medicine

## 2022-10-23 ENCOUNTER — Other Ambulatory Visit: Payer: Self-pay | Admitting: Internal Medicine

## 2022-12-24 LAB — HM DIABETES EYE EXAM

## 2022-12-27 ENCOUNTER — Other Ambulatory Visit: Payer: Self-pay | Admitting: Internal Medicine

## 2023-01-23 ENCOUNTER — Other Ambulatory Visit: Payer: Self-pay | Admitting: Internal Medicine

## 2023-02-09 ENCOUNTER — Encounter: Payer: Self-pay | Admitting: Internal Medicine

## 2023-02-09 ENCOUNTER — Ambulatory Visit (INDEPENDENT_AMBULATORY_CARE_PROVIDER_SITE_OTHER): Payer: 59 | Admitting: Internal Medicine

## 2023-02-09 VITALS — BP 130/82 | HR 63 | Temp 98.4°F | Ht 73.0 in | Wt 258.4 lb

## 2023-02-09 DIAGNOSIS — Z0000 Encounter for general adult medical examination without abnormal findings: Secondary | ICD-10-CM | POA: Diagnosis not present

## 2023-02-09 DIAGNOSIS — R351 Nocturia: Secondary | ICD-10-CM

## 2023-02-09 DIAGNOSIS — E6609 Other obesity due to excess calories: Secondary | ICD-10-CM

## 2023-02-09 DIAGNOSIS — E1169 Type 2 diabetes mellitus with other specified complication: Secondary | ICD-10-CM | POA: Diagnosis not present

## 2023-02-09 DIAGNOSIS — N401 Enlarged prostate with lower urinary tract symptoms: Secondary | ICD-10-CM

## 2023-02-09 DIAGNOSIS — W19XXXA Unspecified fall, initial encounter: Secondary | ICD-10-CM

## 2023-02-09 DIAGNOSIS — E785 Hyperlipidemia, unspecified: Secondary | ICD-10-CM

## 2023-02-09 DIAGNOSIS — I2510 Atherosclerotic heart disease of native coronary artery without angina pectoris: Secondary | ICD-10-CM | POA: Diagnosis not present

## 2023-02-09 DIAGNOSIS — Z6834 Body mass index (BMI) 34.0-34.9, adult: Secondary | ICD-10-CM

## 2023-02-09 DIAGNOSIS — I119 Hypertensive heart disease without heart failure: Secondary | ICD-10-CM | POA: Diagnosis not present

## 2023-02-09 LAB — POCT URINALYSIS DIPSTICK
Bilirubin, UA: NEGATIVE
Blood, UA: NEGATIVE
Glucose, UA: POSITIVE — AB
Ketones, UA: NEGATIVE
Leukocytes, UA: NEGATIVE
Nitrite, UA: NEGATIVE
Protein, UA: POSITIVE — AB
Spec Grav, UA: 1.02 (ref 1.010–1.025)
Urobilinogen, UA: 0.2 E.U./dL
pH, UA: 6 (ref 5.0–8.0)

## 2023-02-09 MED ORDER — ATORVASTATIN CALCIUM 80 MG PO TABS: ORAL_TABLET | ORAL | 2 refills | Status: DC

## 2023-02-09 NOTE — Progress Notes (Signed)
I,Victoria T Hamilton,acting as a scribe for [name], MD.,have documented all relevant documentation on the behalf of [name], MD,as directed by  [name], MD while in the presence of [name], MD.   Subjective:     Patient ID: [name] Hammond , male    DOB: Nov 24, 1961 , 62 y.o.   MRN: [id number]   Chief Complaint  Patient presents with   Annual Exam   Diabetes   Hypertension    HPI  He is here today for a full physical examination. His last visit was June 2023.  He reports compliance with meds. He denies headaches, chest pain and shortness of breath. He admits he is not exercising as much as he should.  Patient is willing to complete foot exam today.   Diabetes He presents for his follow-up diabetic visit. He has type 2 diabetes mellitus. There are no hypoglycemic associated symptoms. Pertinent negatives for diabetes include no blurred vision and no chest pain. There are no hypoglycemic complications. Diabetic complications include heart disease. Risk factors for coronary artery disease include diabetes mellitus, dyslipidemia, male sex, hypertension, obesity and sedentary lifestyle. He is following a diabetic diet. He has had a previous visit with a dietitian. He participates in exercise intermittently. His breakfast blood glucose is taken between 8-9 am. His breakfast blood glucose range is generally 90-110 mg/dl. An ACE inhibitor/angiotensin II receptor blocker is being taken. He does not see a podiatrist.Eye exam is current.  Hypertension This is a chronic problem. The current episode started more than 1 year ago. The problem has been gradually improving since onset. The problem is controlled. Pertinent negatives include no blurred vision, chest pain, palpitations or shortness of breath. Risk factors for coronary artery disease include diabetes mellitus, dyslipidemia, obesity and sedentary lifestyle. The current treatment provides moderate improvement.  Compliance problems include exercise.  Hypertensive end-organ damage includes CAD/MI.     Past Medical History:  Diagnosis Date   Anxiety    Coronary artery disease    Diabetes mellitus without complication ([place])    type 2    Hypertension    Hypothyroid    Obstructive sleep apnea    CPAP   Pure hypercholesterolemia    PVD (peripheral vascular disease) ([place])    Testicular hypofunction    Vitamin D deficiency      Family History  Problem Relation Age of Onset   Diabetes type II Mother    Thyroid disease Mother    Cancer - Other Father        Liver   Hypertension Father    Heart disease Father        Bypass surgery   Thyroid disease Sister    Diabetes type II Brother    Diabetes type II Brother      Current Outpatient Medications:    aspirin 81 MG EC tablet, Take 4 tablets (325 mg total) by mouth daily. 1 tablet daily, Disp: , Rfl:    levothyroxine (SYNTHROID) 100 MCG tablet, TAKE 1 TABLET(100 MCG) BY MOUTH DAILY, Disp: 90 tablet, Rfl: 0   lisinopril-hydrochlorothiazide (ZESTORETIC) 20-12.5 MG tablet, TAKE 1 TABLET BY MOUTH DAILY, Disp: 90 tablet, Rfl: 1   metoprolol tartrate (LOPRESSOR) 25 MG tablet, TAKE 1/2 TABLET(12.5 MG) BY MOUTH TWICE DAILY, Disp: 90 tablet, Rfl: 1   sertraline (ZOLOFT) 50 MG tablet, TAKE 1 TABLET(50 MG) BY MOUTH DAILY, Disp: 90 tablet, Rfl: 1   XIGDUO XR 09-999 MG TB24, TAKE 1 TABLET BY MOUTH DAILY,  Disp: 30 tablet, Rfl: 1   atorvastatin (LIPITOR) 80 MG tablet, TAKE 1 TABLET(80 MG) BY MOUTH AT BEDTIME, Disp: 90 tablet, Rfl: 2   No Known Allergies   Men's preventive visit. Patient Health Questionnaire (PHQ-2) is  [place] Office Visit from 02/09/2023 in [facility]  PHQ-2 Total Score 3     . Patient is on a diabetic diet. Marital status: Married. Relevant history for alcohol use is:  Social History   Substance and Sexual Activity  Alcohol Use No  . Relevant history for tobacco use is:  Social History    Tobacco Use  Smoking Status Never  Smokeless Tobacco Never  .   Review of Systems  Constitutional: Negative.   HENT: Negative.    Eyes: Negative.  Negative for blurred vision.  Respiratory: Negative.  Negative for shortness of breath.   Cardiovascular: Negative.  Negative for chest pain and palpitations.  Endocrine: Negative.   Genitourinary: Negative.   Musculoskeletal:  Positive for arthralgias.       He reports having a fall, occurred about 1 month ago shortly after the New Year ( possibly 12/24/22). He was walking to his neighbors house across his lawn, he missed a step and fell into carport w/ gravel driveway. This resulted in abrasions on both knees. He did not seek any medical attention. He went home, cleaned the area and used hydrogen peroxide.   Skin: Negative.   Allergic/Immunologic: Negative.   Hematological: Negative.      Today's Vitals   02/09/23 1403  BP: 130/82  Pulse: 63  Temp: 98.4 F (36.9 C)  SpO2: 98%  Weight: 258 lb 6.4 oz (117.2 kg)  Height: 6' 1"$  (1.854 m)   Body mass index is 34.09 kg/m.  Wt Readings from Last 3 Encounters:  02/09/23 258 lb 6.4 oz (117.2 kg)  05/21/22 265 lb 3.2 oz (120.3 kg)  02/11/22 273 lb 9.6 oz (124.1 kg)    Objective:  Physical Exam Vitals and nursing note reviewed.  Constitutional:      Appearance: Normal appearance. He is obese.  HENT:     Head: Normocephalic and atraumatic.     Right Ear: Tympanic membrane, ear canal and external ear normal.     Left Ear: Tympanic membrane, ear canal and external ear normal.     Nose:     Comments: Masked     Mouth/Throat:     Comments: Masked  Eyes:     Extraocular Movements: Extraocular movements intact.     Conjunctiva/sclera: Conjunctivae normal.     Pupils: Pupils are equal, round, and reactive to light.  Cardiovascular:     Rate and Rhythm: Normal rate and regular rhythm.     Pulses: Normal pulses.          Dorsalis pedis pulses are 2+ on the right side and 2+ on the  left side.     Heart sounds: Normal heart sounds.  Pulmonary:     Effort: Pulmonary effort is normal.     Breath sounds: Normal breath sounds.  Chest:  Breasts:    Right: Normal. No swelling, bleeding, inverted nipple, mass or nipple discharge.     Left: Normal. No swelling, bleeding, inverted nipple, mass or nipple discharge.     Comments: Healed scars b/l Abdominal:     General: Abdomen is flat. Bowel sounds are normal.     Palpations: Abdomen is soft.  Genitourinary:    Comments: Deferred, per patient request Musculoskeletal:  General: Normal range of motion.     Cervical back: Normal range of motion and neck supple.  Feet:     Right foot:     Protective Sensation: 5 sites tested.  5 sites sensed.     Skin integrity: Dry skin present.     Toenail Condition: Right toenails are normal.     Left foot:     Protective Sensation: 5 sites tested.  5 sites sensed.     Skin integrity: Dry skin present.     Toenail Condition: Left toenails are normal.  Skin:    General: Skin is warm.     Comments: Abrasions b/l knees  Neurological:     General: No focal deficit present.     Mental Status: He is alert.  Psychiatric:        Mood and Affect: Mood normal.        Behavior: Behavior normal.     Assessment And Plan:    1. Routine general medical examination at health care facility Comments: A full exam was performed. DRE deferred, as per patient request. He prefers Urology referral for prostate exam.  PATIENT IS ADVISED TO GET 30-45 MINUTES REGULAR EXERCISE NO LESS THAN FOUR TO FIVE DAYS PER WEEK - BOTH WEIGHTBEARING EXERCISES AND AEROBIC ARE RECOMMENDED.  PATIENT IS ADVISED TO FOLLOW A HEALTHY DIET WITH AT LEAST SIX FRUITS/VEGGIES PER DAY, DECREASE INTAKE OF RED MEAT, AND TO INCREASE FISH INTAKE TO TWO DAYS PER WEEK.  MEATS/FISH SHOULD NOT BE FRIED, BAKED OR BROILED IS PREFERABLE.  IT IS ALSO IMPORTANT TO CUT BACK ON YOUR SUGAR INTAKE. PLEASE AVOID ANYTHING WITH ADDED SUGAR, CORN  SYRUP OR OTHER SWEETENERS. IF YOU MUST USE A SWEETENER, YOU CAN TRY STEVIA. IT IS ALSO IMPORTANT TO AVOID ARTIFICIALLY SWEETENERS AND DIET BEVERAGES. LASTLY, I SUGGEST WEARING SPF 50 SUNSCREEN ON EXPOSED PARTS AND ESPECIALLY WHEN IN THE DIRECT SUNLIGHT FOR AN EXTENDED PERIOD OF TIME.  PLEASE AVOID FAST FOOD RESTAURANTS AND INCREASE YOUR WATER INTAKE. - POCT Urinalysis Dipstick (81002) - Microalbumin / creatinine urine ratio - EKG 12-Lead - CMP14+EGFR - Lipid panel - CBC - Hemoglobin A1c - PSA - TSH + free T4 - Testosterone,Free and Total  2. Dyslipidemia associated with type 2 diabetes mellitus ([place]) Comments: Chronic, diabetic foot exam was performed.  He will c/w Xigduo qd.  Will consider adding GLP-1 agent. Importance of diet/exercise/OV compliance was d/w patient.  I DISCUSSED WITH THE PATIENT AT LENGTH REGARDING THE GOALS OF GLYCEMIC CONTROL AND POSSIBLE LONG-TERM COMPLICATIONS.  I  ALSO STRESSED THE IMPORTANCE OF COMPLIANCE WITH HOME GLUCOSE MONITORING, DIETARY RESTRICTIONS INCLUDING AVOIDANCE OF SUGARY DRINKS/PROCESSED FOODS,  ALONG WITH REGULAR EXERCISE.  I  ALSO STRESSED THE IMPORTANCE OF ANNUAL EYE EXAMS, SELF FOOT CARE AND COMPLIANCE WITH OFFICE VISITS.  - POCT Urinalysis Dipstick (81002) - Microalbumin / creatinine urine ratio  3. Benign hypertensive heart disease without heart failure Comments: Chronic, fair control. Goal BP<120/80.  EKG performed, SB w/o acute findings. He is encouraged to follow heart healthy lifestyle. - EKG 12-Lead  4. Coronary artery disease involving native coronary artery of native heart without angina pectoris Comments: Chronic, LDL goal < 70. He will c/w ASA, statin therapy and BBlocker therapy. Encouraged to follow heart healthy lifestyle. - atorvastatin (LIPITOR) 80 MG tablet; TAKE 1 TABLET(80 MG) BY MOUTH AT BEDTIME  Dispense: 90 tablet; Refill: 2  5. Fall, initial encounter Comments: Occurred around 12/24/22. He is encouraged to avoid walking in the  yard at nighttime.  6. BPH associated with nocturia Comments: Chronic, I will check PSA today. I will forward to [facility]. He will let me know his preferred MD for referral.  7. Class 1 obesity due to excess calories with serious comorbidity and body mass index (BMI) of 34.0 to 34.9 in adult He is encouraged to strive for BMI less than 30 to decrease cardiac risk. Advised to aim for at least 150 minutes of exercise per week.   Patient was given opportunity to ask questions. Patient verbalized understanding of the plan and was able to repeat key elements of the plan. All questions were answered to their satisfaction.   I, [name], MD, have reviewed all documentation for this visit. The documentation on 02/09/23 for the exam, diagnosis, procedures, and orders are all accurate and complete.   THE PATIENT IS ENCOURAGED TO PRACTICE SOCIAL DISTANCING DUE TO THE COVID-19 PANDEMIC.

## 2023-02-09 NOTE — Patient Instructions (Signed)
Health Maintenance, Male Adopting a healthy lifestyle and getting preventive care are important in promoting health and wellness. Ask your health care provider about: The right schedule for you to have regular tests and exams. Things you can do on your own to prevent diseases and keep yourself healthy. What should I know about diet, weight, and exercise? Eat a healthy diet  Eat a diet that includes plenty of vegetables, fruits, low-fat dairy products, and lean protein. Do not eat a lot of foods that are high in solid fats, added sugars, or sodium. Maintain a healthy weight Body mass index (BMI) is a measurement that can be used to identify possible weight problems. It estimates body fat based on height and weight. Your health care provider can help determine your BMI and help you achieve or maintain a healthy weight. Get regular exercise Get regular exercise. This is one of the most important things you can do for your health. Most adults should: Exercise for at least 150 minutes each week. The exercise should increase your heart rate and make you sweat (moderate-intensity exercise). Do strengthening exercises at least twice a week. This is in addition to the moderate-intensity exercise. Spend less time sitting. Even light physical activity can be beneficial. Watch cholesterol and blood lipids Have your blood tested for lipids and cholesterol at 62 years of age, then have this test every 5 years. You may need to have your cholesterol levels checked more often if: Your lipid or cholesterol levels are high. You are older than 62 years of age. You are at high risk for heart disease. What should I know about cancer screening? Many types of cancers can be detected early and may often be prevented. Depending on your health history and family history, you may need to have cancer screening at various ages. This may include screening for: Colorectal cancer. Prostate cancer. Skin cancer. Lung  cancer. What should I know about heart disease, diabetes, and high blood pressure? Blood pressure and heart disease High blood pressure causes heart disease and increases the risk of stroke. This is more likely to develop in people who have high blood pressure readings or are overweight. Talk with your health care provider about your target blood pressure readings. Have your blood pressure checked: Every 3-5 years if you are 18-39 years of age. Every year if you are 40 years old or older. If you are between the ages of 65 and 75 and are a current or former smoker, ask your health care provider if you should have a one-time screening for abdominal aortic aneurysm (AAA). Diabetes Have regular diabetes screenings. This checks your fasting blood sugar level. Have the screening done: Once every three years after age 45 if you are at a normal weight and have a low risk for diabetes. More often and at a younger age if you are overweight or have a high risk for diabetes. What should I know about preventing infection? Hepatitis B If you have a higher risk for hepatitis B, you should be screened for this virus. Talk with your health care provider to find out if you are at risk for hepatitis B infection. Hepatitis C Blood testing is recommended for: Everyone born from 1945 through 1965. Anyone with known risk factors for hepatitis C. Sexually transmitted infections (STIs) You should be screened each year for STIs, including gonorrhea and chlamydia, if: You are sexually active and are younger than 62 years of age. You are older than 62 years of age and your   health care provider tells you that you are at risk for this type of infection. Your sexual activity has changed since you were last screened, and you are at increased risk for chlamydia or gonorrhea. Ask your health care provider if you are at risk. Ask your health care provider about whether you are at high risk for HIV. Your health care provider  may recommend a prescription medicine to help prevent HIV infection. If you choose to take medicine to prevent HIV, you should first get tested for HIV. You should then be tested every 3 months for as long as you are taking the medicine. Follow these instructions at home: Alcohol use Do not drink alcohol if your health care provider tells you not to drink. If you drink alcohol: Limit how much you have to 0-2 drinks a day. Know how much alcohol is in your drink. In the [place] one drink equals one 12 oz bottle of beer (355 mL), one 5 oz glass of wine (148 mL), or one 1 oz glass of hard liquor (44 mL). Lifestyle Do not use any products that contain nicotine or tobacco. These products include cigarettes, chewing tobacco, and vaping devices, such as e-cigarettes. If you need help quitting, ask your health care provider. Do not use street drugs. Do not share needles. Ask your health care provider for help if you need support or information about quitting drugs. General instructions Schedule regular health, dental, and eye exams. Stay current with your vaccines. Tell your health care provider if: You often feel depressed. You have ever been abused or do not feel safe at home. Summary Adopting a healthy lifestyle and getting preventive care are important in promoting health and wellness. Follow your health care provider's instructions about healthy diet, exercising, and getting tested or screened for diseases. Follow your health care provider's instructions on monitoring your cholesterol and blood pressure. This information is not intended to replace advice given to you by your health care provider. Make sure you discuss any questions you have with your health care provider. Document Revised: 04/29/2021 Document Reviewed: 04/29/2021 Elsevier Patient Education  2023 [employer]  

## 2023-02-15 ENCOUNTER — Other Ambulatory Visit: Payer: Self-pay | Admitting: Internal Medicine

## 2023-02-15 DIAGNOSIS — N401 Enlarged prostate with lower urinary tract symptoms: Secondary | ICD-10-CM

## 2023-02-15 DIAGNOSIS — E291 Testicular hypofunction: Secondary | ICD-10-CM

## 2023-02-15 LAB — TSH+FREE T4
Free T4: 1.59 ng/dL (ref 0.82–1.77)
TSH: 2.06 u[IU]/mL (ref 0.450–4.500)

## 2023-02-15 LAB — CMP14+EGFR
ALT: 16 IU/L (ref 0–44)
AST: 18 IU/L (ref 0–40)
Albumin/Globulin Ratio: 1.4 (ref 1.2–2.2)
Albumin: 4.3 g/dL (ref 3.9–4.9)
Alkaline Phosphatase: 99 IU/L (ref 44–121)
BUN/Creatinine Ratio: 11 (ref 10–24)
BUN: 9 mg/dL (ref 8–27)
Bilirubin Total: 0.5 mg/dL (ref 0.0–1.2)
CO2: 24 mmol/L (ref 20–29)
Calcium: 9.3 mg/dL (ref 8.6–10.2)
Chloride: 103 mmol/L (ref 96–106)
Creatinine, Ser: 0.81 mg/dL (ref 0.76–1.27)
Globulin, Total: 3 g/dL (ref 1.5–4.5)
Glucose: 94 mg/dL (ref 70–99)
Potassium: 4.2 mmol/L (ref 3.5–5.2)
Sodium: 142 mmol/L (ref 134–144)
Total Protein: 7.3 g/dL (ref 6.0–8.5)
eGFR: 100 mL/min/{1.73_m2} (ref >59)

## 2023-02-15 LAB — LIPID PANEL
Chol/HDL Ratio: 2.7 ratio (ref 0.0–5.0)
Cholesterol, Total: 123 mg/dL (ref 100–199)
HDL: 46 mg/dL (ref >39)
LDL Chol Calc (NIH): 64 mg/dL (ref 0–99)
Triglycerides: 63 mg/dL (ref 0–149)
VLDL Cholesterol Cal: 13 mg/dL (ref 5–40)

## 2023-02-15 LAB — CBC
Hematocrit: 47.7 % (ref 37.5–51.0)
Hemoglobin: 15.7 g/dL (ref 13.0–17.7)
MCH: 28.9 pg (ref 26.6–33.0)
MCHC: 32.9 g/dL (ref 31.5–35.7)
MCV: 88 fL (ref 79–97)
Platelets: 309 10*3/uL (ref 150–450)
RBC: 5.43 x10E6/uL (ref 4.14–5.80)
RDW: 13.4 % (ref 11.6–15.4)
WBC: 8 10*3/uL (ref 3.4–10.8)

## 2023-02-15 LAB — TESTOSTERONE,FREE AND TOTAL
Testosterone, Free: 4.6 pg/mL — ABNORMAL LOW (ref 6.6–18.1)
Testosterone: 399 ng/dL (ref 264–916)

## 2023-02-15 LAB — HEMOGLOBIN A1C
Est. average glucose Bld gHb Est-mCnc: 128 mg/dL
Hgb A1c MFr Bld: 6.1 % — ABNORMAL HIGH (ref 4.8–5.6)

## 2023-02-15 LAB — PSA: Prostate Specific Ag, Serum: 0.4 ng/mL (ref 0.0–4.0)

## 2023-02-16 LAB — MICROALBUMIN / CREATININE URINE RATIO

## 2023-03-15 ENCOUNTER — Other Ambulatory Visit: Payer: Self-pay | Admitting: Internal Medicine

## 2023-03-16 ENCOUNTER — Other Ambulatory Visit: Payer: Self-pay | Admitting: Internal Medicine

## 2023-03-16 DIAGNOSIS — I2510 Atherosclerotic heart disease of native coronary artery without angina pectoris: Secondary | ICD-10-CM

## 2023-03-31 ENCOUNTER — Other Ambulatory Visit: Payer: Self-pay | Admitting: Internal Medicine

## 2023-04-23 ENCOUNTER — Other Ambulatory Visit: Payer: Self-pay | Admitting: Internal Medicine

## 2023-04-24 ENCOUNTER — Other Ambulatory Visit: Payer: Self-pay | Admitting: Internal Medicine

## 2023-06-15 ENCOUNTER — Encounter: Payer: Self-pay | Admitting: Internal Medicine

## 2023-06-15 ENCOUNTER — Ambulatory Visit (INDEPENDENT_AMBULATORY_CARE_PROVIDER_SITE_OTHER): Payer: 59 | Admitting: Internal Medicine

## 2023-06-15 VITALS — BP 118/78 | HR 72 | Temp 98.2°F | Ht 73.0 in | Wt 258.4 lb

## 2023-06-15 DIAGNOSIS — I119 Hypertensive heart disease without heart failure: Secondary | ICD-10-CM | POA: Diagnosis not present

## 2023-06-15 DIAGNOSIS — E785 Hyperlipidemia, unspecified: Secondary | ICD-10-CM

## 2023-06-15 DIAGNOSIS — I2510 Atherosclerotic heart disease of native coronary artery without angina pectoris: Secondary | ICD-10-CM | POA: Diagnosis not present

## 2023-06-15 DIAGNOSIS — E1169 Type 2 diabetes mellitus with other specified complication: Secondary | ICD-10-CM | POA: Diagnosis not present

## 2023-06-15 DIAGNOSIS — E6609 Other obesity due to excess calories: Secondary | ICD-10-CM

## 2023-06-15 DIAGNOSIS — Z6834 Body mass index (BMI) 34.0-34.9, adult: Secondary | ICD-10-CM

## 2023-06-15 MED ORDER — ATORVASTATIN CALCIUM 80 MG PO TABS
ORAL_TABLET | ORAL | 2 refills | Status: DC
Start: 2023-06-15 — End: 2023-12-14

## 2023-06-15 MED ORDER — LISINOPRIL-HYDROCHLOROTHIAZIDE 20-12.5 MG PO TABS: 1.0000 | ORAL_TABLET | Freq: Every day | ORAL | 1 refills | Status: DC

## 2023-06-15 MED ORDER — SERTRALINE HCL 50 MG PO TABS: ORAL_TABLET | ORAL | 1 refills | Status: DC

## 2023-06-15 MED ORDER — METOPROLOL TARTRATE 25 MG PO TABS
ORAL_TABLET | ORAL | 1 refills | Status: DC
Start: 2023-06-15 — End: 2023-10-20

## 2023-06-15 MED ORDER — DAPAGLIFLOZIN PRO-METFORMIN ER 10-1000 MG PO TB24: 1.0000 | ORAL_TABLET | Freq: Every day | ORAL | 1 refills | Status: DC

## 2023-06-15 NOTE — Progress Notes (Addendum)
I,Victoria T Deloria Lair, CMA,acting as a Neurosurgeon for Gwynneth Aliment, MD.,have documented all relevant documentation on the behalf of Gwynneth Aliment, MD,as directed by  Gwynneth Aliment, MD while in the presence of Gwynneth Aliment, MD.  Subjective:  Patient ID: [name] Hammond , male    DOB: November 24, 1961 , 62 y.o.   MRN: [id number]  Chief Complaint  Patient presents with   Diabetes   Hypertension    HPI  The patient is here today for a diabetes, blood pressure f/u. He reports compliance with meds.  He denies headaches, chest pain and shortness of breath. He has no specific concerns or complaints at this time.   He has not been checking his sugars regularly recently. He unfortunately left his meter when he was vacationing.   Diabetes He presents for his follow-up diabetic visit. He has type 2 diabetes mellitus. There are no hypoglycemic associated symptoms. Pertinent negatives for diabetes include no blurred vision, no chest pain, no polydipsia, no polyphagia and no polyuria. There are no hypoglycemic complications. Diabetic complications include heart disease. Risk factors for coronary artery disease include diabetes mellitus, dyslipidemia, male sex, hypertension, obesity and sedentary lifestyle. He participates in exercise intermittently. His breakfast blood glucose is taken between 8-9 am. His breakfast blood glucose range is generally 90-110 mg/dl. An ACE inhibitor/angiotensin II receptor blocker is being taken. He does not see a podiatrist.Eye exam is current.  Hypertension This is a chronic problem. The current episode started more than 1 year ago. The problem has been gradually improving since onset. The problem is controlled. Pertinent negatives include no blurred vision, chest pain, palpitations or shortness of breath. Risk factors for coronary artery disease include diabetes mellitus, dyslipidemia, obesity, sedentary lifestyle and male gender. The current treatment provides moderate improvement.  Compliance problems include exercise.  Hypertensive end-organ damage includes CAD/MI.     Past Medical History:  Diagnosis Date   Anxiety    Coronary artery disease    Diabetes mellitus without complication (HCC)    type 2    Hypertension    Hypothyroid    Obstructive sleep apnea    CPAP   Pure hypercholesterolemia    PVD (peripheral vascular disease) (HCC)    Testicular hypofunction    Vitamin D deficiency      Family History  Problem Relation Age of Onset   Diabetes type II Mother    Thyroid disease Mother    Cancer - Other Father        Liver   Hypertension Father    Heart disease Father        Bypass surgery   Thyroid disease Sister    Diabetes type II Brother    Diabetes type II Brother      Current Outpatient Medications:    aspirin 81 MG EC tablet, Take 4 tablets (325 mg total) by mouth daily. 1 tablet daily, Disp: , Rfl:    levothyroxine (SYNTHROID) 100 MCG tablet, TAKE 1 TABLET(100 MCG) BY MOUTH DAILY, Disp: 90 tablet, Rfl: 0   atorvastatin (LIPITOR) 80 MG tablet, TAKE 1 TABLET(80 MG) BY MOUTH AT BEDTIME, Disp: 90 tablet, Rfl: 2   Dapagliflozin Pro-metFORMIN ER 09-999 MG TB24, Take 1 tablet by mouth daily., Disp: 90 tablet, Rfl: 1   lisinopril-hydrochlorothiazide (ZESTORETIC) 20-12.5 MG tablet, Take 1 tablet by mouth daily., Disp: 90 tablet, Rfl: 1   metoprolol tartrate (LOPRESSOR) 25 MG tablet, TAKE 1/2 TABLET(12.5 MG) BY MOUTH TWICE DAILY, Disp: 90 tablet, Rfl: 1  sertraline (ZOLOFT) 50 MG tablet, TAKE 1 TABLET(50 MG) BY MOUTH DAILY, Disp: 90 tablet, Rfl: 1   No Known Allergies   Review of Systems  Constitutional: Negative.   HENT: Negative.    Eyes:  Negative for blurred vision.  Respiratory: Negative.  Negative for shortness of breath.   Cardiovascular: Negative.  Negative for chest pain and palpitations.  Gastrointestinal: Negative.   Endocrine: Negative for polydipsia, polyphagia and polyuria.  Genitourinary: Negative.   Musculoskeletal:  Negative.   Skin: Negative.   Allergic/Immunologic: Negative.   Neurological: Negative.   Hematological: Negative.      Today's Vitals   06/15/23 1433  BP: 118/78  Pulse: 72  Temp: 98.2 F (36.8 C)  SpO2: 98%  Weight: 258 lb 6.4 oz (117.2 kg)  Height: 6\' 1"  (1.854 m)   Body mass index is 34.09 kg/m.  Wt Readings from Last 3 Encounters:  06/15/23 258 lb 6.4 oz (117.2 kg)  02/09/23 258 lb 6.4 oz (117.2 kg)  05/21/22 265 lb 3.2 oz (120.3 kg)    The ASCVD Risk score (Arnett DK, et al., 2019) failed to calculate for the following reasons:   The valid total cholesterol range is 130 to 320 mg/dL  Objective:  Physical Exam Vitals and nursing note reviewed.  Constitutional:      Appearance: Normal appearance.  HENT:     Head: Normocephalic and atraumatic.  Eyes:     Extraocular Movements: Extraocular movements intact.  Cardiovascular:     Rate and Rhythm: Normal rate and regular rhythm.     Heart sounds: Normal heart sounds.  Pulmonary:     Effort: Pulmonary effort is normal.     Breath sounds: Normal breath sounds.  Musculoskeletal:     Cervical back: Normal range of motion.  Skin:    General: Skin is warm.  Neurological:     General: No focal deficit present.     Mental Status: He is alert.  Psychiatric:        Mood and Affect: Mood normal.         Assessment And Plan:  1. Dyslipidemia associated with type 2 diabetes mellitus (HCC) Comments: Chronic, he will c/w Xigduo 10/1000mg  once daily. We also dscussed the use of GLP-1 agonists for cardiac protection. He denies family history of thyroid cancer. WE DISCUSSED STARTING OZEMPIC TO HELP HIM ACHIEVE GLYCEMIC CONTROL. HE WILL START WITH 0.25mg  WEEKLY ON SUNDAYS. HE WAS INSTRUCTED ON HOW TO SELF ADMINISTER THE MEDICATION. HE DOES NOT WISH TO START TODAY, PREFERS TO WAIT UNTIL NEXT SUNDAY. HE WAS REMINDED TO STOP EATING WHEN FULL. HE WILL RTO IN SIX WEEKS FOR RE-EVALUATION.  ALL QUESTIONS WERE ANSWERED TO HIS  SATISFACTION. - Hemoglobin A1c - Urine microalbumin-creatinine with uACR - Dapagliflozin Pro-metFORMIN ER 09-999 MG TB24; Take 1 tablet by mouth daily.  Dispense: 90 tablet; Refill: 1 - atorvastatin (LIPITOR) 80 MG tablet; TAKE 1 TABLET(80 MG) BY MOUTH AT BEDTIME  Dispense: 90 tablet; Refill: 2 - BMP8+EGFR  2. Benign hypertensive heart disease without heart failure Comments: Chronic, well controlled. He will c/2 lisinopril 20/25mg  and metoprolol 25mg  1/2 tab twice daily. Reminded to follow a low sodium diet. - metoprolol tartrate (LOPRESSOR) 25 MG tablet; TAKE 1/2 TABLET(12.5 MG) BY MOUTH TWICE DAILY  Dispense: 90 tablet; Refill: 1 - lisinopril-hydrochlorothiazide (ZESTORETIC) 20-12.5 MG tablet; Take 1 tablet by mouth daily.  Dispense: 90 tablet; Refill: 1  3. Coronary artery disease involving native coronary artery of native heart without angina pectoris Comments: Chronic, LDL  goal < 70. He will c/w ASA, statin therapy and BBlocker therapy. Encouraged to follow heart healthy lifestyle. - atorvastatin (LIPITOR) 80 MG tablet; TAKE 1 TABLET(80 MG) BY MOUTH AT BEDTIME  Dispense: 90 tablet; Refill: 2  4. Class 1 obesity due to excess calories with serious comorbidity and body mass index (BMI) of 34.0 to 34.9 in adult He is encouraged to strive for BMI less than 30 to decrease cardiac risk. Advised to aim for at least 150 minutes of exercise per week.   Return in about 6 weeks (around 07/27/2023), or ozempic 0.25mg  f/u, for 4 month DM.  Patient was given opportunity to ask questions. Patient verbalized understanding of the plan and was able to repeat key elements of the plan. All questions were answered to their satisfaction.    I, Gwynneth Aliment, MD, have reviewed all documentation for this visit. The documentation on 06/15/23 for the exam, diagnosis, procedures, and orders are all accurate and complete.   IF YOU HAVE BEEN REFERRED TO A SPECIALIST, IT MAY TAKE 1-2 WEEKS TO SCHEDULE/PROCESS THE  REFERRAL. IF YOU HAVE NOT HEARD FROM US/SPECIALIST IN TWO WEEKS, PLEASE GIVE [place] A CALL AT [phone].

## 2023-06-15 NOTE — Patient Instructions (Signed)

## 2023-06-16 ENCOUNTER — Other Ambulatory Visit: Payer: Self-pay

## 2023-06-16 DIAGNOSIS — E1165 Type 2 diabetes mellitus with hyperglycemia: Secondary | ICD-10-CM

## 2023-06-16 LAB — BMP8+EGFR
BUN/Creatinine Ratio: 16 (ref 10–24)
BUN: 15 mg/dL (ref 8–27)
CO2: 24 mmol/L (ref 20–29)
Calcium: 9.5 mg/dL (ref 8.6–10.2)
Chloride: 104 mmol/L (ref 96–106)
Creatinine, Ser: 0.94 mg/dL (ref 0.76–1.27)
Glucose: 101 mg/dL — ABNORMAL HIGH (ref 70–99)
Potassium: 4.8 mmol/L (ref 3.5–5.2)
Sodium: 140 mmol/L (ref 134–144)
eGFR: 92 mL/min/{1.73_m2} (ref >59)

## 2023-06-16 LAB — MICROALBUMIN / CREATININE URINE RATIO
Creatinine, Urine: 140.5 mg/dL
Microalb/Creat Ratio: 8 mg/g creat (ref 0–29)
Microalbumin, Urine: 11.1 ug/mL

## 2023-06-16 LAB — HEMOGLOBIN A1C
Est. average glucose Bld gHb Est-mCnc: 126 mg/dL
Hgb A1c MFr Bld: 6 % — ABNORMAL HIGH (ref 4.8–5.6)

## 2023-06-16 MED ORDER — BLOOD GLUCOSE MONITORING SUPPL DEVI
1.0000 | Freq: Three times a day (TID) | 0 refills | Status: AC
Start: 2023-06-16 — End: ?

## 2023-06-16 MED ORDER — BLOOD GLUCOSE TEST VI STRP: 1.0000 | ORAL_STRIP | Freq: Three times a day (TID) | 0 refills | Status: AC

## 2023-06-16 MED ORDER — LANCETS MISC. MISC
1.0000 | Freq: Three times a day (TID) | 0 refills | Status: AC
Start: 2023-06-16 — End: 2023-07-16

## 2023-06-16 MED ORDER — LANCET DEVICE MISC
1.0000 | Freq: Three times a day (TID) | 0 refills | Status: AC
Start: 2023-06-16 — End: 2023-07-16

## 2023-07-13 ENCOUNTER — Other Ambulatory Visit: Payer: Self-pay | Admitting: Internal Medicine

## 2023-07-20 ENCOUNTER — Other Ambulatory Visit: Payer: Self-pay | Admitting: Internal Medicine

## 2023-07-20 ENCOUNTER — Other Ambulatory Visit: Payer: Self-pay | Admitting: Nurse Practitioner

## 2023-07-28 ENCOUNTER — Ambulatory Visit: Payer: 59 | Admitting: Internal Medicine

## 2023-07-31 ENCOUNTER — Ambulatory Visit (INDEPENDENT_AMBULATORY_CARE_PROVIDER_SITE_OTHER): Payer: 59 | Admitting: Internal Medicine

## 2023-07-31 ENCOUNTER — Encounter: Payer: Self-pay | Admitting: Internal Medicine

## 2023-07-31 VITALS — BP 104/70 | HR 73 | Temp 98.0°F | Ht 73.0 in | Wt 250.2 lb

## 2023-07-31 DIAGNOSIS — E785 Hyperlipidemia, unspecified: Secondary | ICD-10-CM | POA: Diagnosis not present

## 2023-07-31 DIAGNOSIS — E1169 Type 2 diabetes mellitus with other specified complication: Secondary | ICD-10-CM

## 2023-07-31 DIAGNOSIS — Z6833 Body mass index (BMI) 33.0-33.9, adult: Secondary | ICD-10-CM

## 2023-07-31 DIAGNOSIS — E6609 Other obesity due to excess calories: Secondary | ICD-10-CM

## 2023-07-31 DIAGNOSIS — E1165 Type 2 diabetes mellitus with hyperglycemia: Secondary | ICD-10-CM

## 2023-07-31 MED ORDER — SEMAGLUTIDE(0.25 OR 0.5MG/DOS) 2 MG/3ML ~~LOC~~ SOPN: PEN_INJECTOR | SUBCUTANEOUS | 3 refills | Status: DC

## 2023-07-31 NOTE — Patient Instructions (Signed)

## 2023-07-31 NOTE — Progress Notes (Unsigned)
I,Victoria T Deloria Lair, CMA,acting as a Neurosurgeon for Gwynneth Aliment, MD.,have documented all relevant documentation on the behalf of Gwynneth Aliment, MD,as directed by  Gwynneth Aliment, MD while in the presence of Gwynneth Aliment, MD.  Subjective:  Patient ID: [name] Hammond , male    DOB: Jun 11, 1961 , 62 y.o.   MRN: [id number]  Chief Complaint  Patient presents with   Med Check    HPI  Patient presents today for Ozempic 0.25 follow up. He reports compliance with medications. Denies headache, chest pain, and SOB. He has not had any issues with the medication. He has slightly decreased appetite. He has no other concerns today.    Diabetes He presents for his follow-up diabetic visit. He has type 2 diabetes mellitus. There are no hypoglycemic associated symptoms. Pertinent negatives for diabetes include no blurred vision, no chest pain, no polydipsia, no polyphagia and no polyuria. There are no hypoglycemic complications. Diabetic complications include heart disease. Risk factors for coronary artery disease include diabetes mellitus, dyslipidemia, male sex, hypertension, obesity and sedentary lifestyle. He participates in exercise intermittently. His breakfast blood glucose is taken between 8-9 am. His breakfast blood glucose range is generally 90-110 mg/dl. An ACE inhibitor/angiotensin II receptor blocker is being taken. He does not see a podiatrist.Eye exam is current.  Hypertension This is a chronic problem. The current episode started more than 1 year ago. The problem has been gradually improving since onset. The problem is controlled. Pertinent negatives include no blurred vision, chest pain, palpitations or shortness of breath. Risk factors for coronary artery disease include diabetes mellitus, dyslipidemia, obesity, sedentary lifestyle and male gender. The current treatment provides moderate improvement. Compliance problems include exercise.  Hypertensive end-organ damage includes CAD/MI.      Past Medical History:  Diagnosis Date   Anxiety    Coronary artery disease    Diabetes mellitus without complication (HCC)    type 2    Hypertension    Hypothyroid    Obstructive sleep apnea    CPAP   Pure hypercholesterolemia    PVD (peripheral vascular disease) (HCC)    Testicular hypofunction    Vitamin D deficiency      Family History  Problem Relation Age of Onset   Diabetes type II Mother    Thyroid disease Mother    Cancer - Other Father        Liver   Hypertension Father    Heart disease Father        Bypass surgery   Thyroid disease Sister    Diabetes type II Brother    Diabetes type II Brother      Current Outpatient Medications:    Accu-Chek Softclix Lancets lancets, USE ONE LANCET IN MORNING, ONE AT NOON AND ONE AT BEDTIME, Disp: 100 each, Rfl: 1   aspirin 81 MG EC tablet, Take 4 tablets (325 mg total) by mouth daily. 1 tablet daily, Disp: , Rfl:    atorvastatin (LIPITOR) 80 MG tablet, TAKE 1 TABLET(80 MG) BY MOUTH AT BEDTIME, Disp: 90 tablet, Rfl: 2   Blood Glucose Monitoring Suppl DEVI, 1 each by Does not apply route in the morning, at noon, and at bedtime. May substitute to any manufacturer covered by patient's insurance., Disp: 1 each, Rfl: 0   Dapagliflozin Pro-metFORMIN ER 09-999 MG TB24, Take 1 tablet by mouth daily., Disp: 90 tablet, Rfl: 1   levothyroxine (SYNTHROID) 100 MCG tablet, TAKE 1 TABLET(100 MCG) BY MOUTH DAILY, Disp: 90 tablet, Rfl: 0  lisinopril-hydrochlorothiazide (ZESTORETIC) 20-12.5 MG tablet, Take 1 tablet by mouth daily., Disp: 90 tablet, Rfl: 1   metoprolol tartrate (LOPRESSOR) 25 MG tablet, TAKE 1/2 TABLET(12.5 MG) BY MOUTH TWICE DAILY, Disp: 90 tablet, Rfl: 1   Semaglutide,0.25 or 0.5MG /DOS, 2 MG/3ML SOPN, Inject 0.5mg  sq weeklly, Disp: 3 mL, Rfl: 3   sertraline (ZOLOFT) 50 MG tablet, TAKE 1 TABLET(50 MG) BY MOUTH DAILY, Disp: 90 tablet, Rfl: 1   No Known Allergies   Review of Systems  Constitutional: Negative.   HENT:  Negative.    Eyes: Negative.  Negative for blurred vision.  Respiratory: Negative.  Negative for shortness of breath.   Cardiovascular:  Negative for chest pain and palpitations.  Gastrointestinal: Negative.   Endocrine: Negative.  Negative for polydipsia, polyphagia and polyuria.  Genitourinary: Negative.   Skin: Negative.   Allergic/Immunologic: Negative.   Neurological: Negative.   Hematological: Negative.      Today's Vitals   07/31/23 1504  BP: 104/70  Pulse: 73  Temp: 98 F (36.7 C)  SpO2: 98%  Weight: 250 lb 3.2 oz (113.5 kg)  Height: 6\' 1"  (1.854 m)   Body mass index is 33.01 kg/m.  Wt Readings from Last 3 Encounters:  07/31/23 250 lb 3.2 oz (113.5 kg)  06/15/23 258 lb 6.4 oz (117.2 kg)  02/09/23 258 lb 6.4 oz (117.2 kg)     Objective:  Physical Exam Vitals and nursing note reviewed.  Constitutional:      Appearance: Normal appearance. He is obese.  HENT:     Head: Normocephalic and atraumatic.  Eyes:     Extraocular Movements: Extraocular movements intact.  Cardiovascular:     Rate and Rhythm: Normal rate and regular rhythm.     Heart sounds: Normal heart sounds.  Pulmonary:     Effort: Pulmonary effort is normal.     Breath sounds: Normal breath sounds.  Musculoskeletal:     Cervical back: Normal range of motion.  Skin:    General: Skin is warm.  Neurological:     General: No focal deficit present.     Mental Status: He is alert.  Psychiatric:        Mood and Affect: Mood normal.         Assessment And Plan:  Dyslipidemia associated with type 2 diabetes mellitus (HCC) Assessment & Plan: Chronic, I will increase dose of Ozempic to 0.5mg  weekly. He will rto in 2 months for his next DM check. He is reminded to optimize his protein intake to ensure he does not have muscle wasting. No labs are needed today, June 2024 labs reviewed in detail.    Class 1 obesity due to excess calories with serious comorbidity and body mass index (BMI) of 33.0 to  33.9 in adult Assessment & Plan: He was congratulated on his 8lb weight loss since June 2024. He is encouraged to aim for at least 150 minutes of exercise/week.    Other orders -     Semaglutide(0.25 or 0.5MG /DOS); Inject 0.5mg  sq weeklly  Dispense: 3 mL; Refill: 3  He is encouraged to strive for BMI less than 30 to decrease cardiac risk. Advised to aim for at least 150 minutes of exercise per week.    Return if symptoms worsen or fail to improve.  Patient was given opportunity to ask questions. Patient verbalized understanding of the plan and was able to repeat key elements of the plan. All questions were answered to their satisfaction.    I, Gwynneth Aliment, MD, have  reviewed all documentation for this visit. The documentation on 07/31/23 for the exam, diagnosis, procedures, and orders are all accurate and complete.   IF YOU HAVE BEEN REFERRED TO A SPECIALIST, IT MAY TAKE 1-2 WEEKS TO SCHEDULE/PROCESS THE REFERRAL. IF YOU HAVE NOT HEARD FROM US/SPECIALIST IN TWO WEEKS, PLEASE GIVE [place] A CALL AT [phone].   THE PATIENT IS ENCOURAGED TO PRACTICE SOCIAL DISTANCING DUE TO THE COVID-19 PANDEMIC.

## 2023-08-01 DIAGNOSIS — E66811 Obesity, class 1: Secondary | ICD-10-CM | POA: Insufficient documentation

## 2023-08-01 DIAGNOSIS — E6609 Other obesity due to excess calories: Secondary | ICD-10-CM | POA: Insufficient documentation

## 2023-08-01 NOTE — Assessment & Plan Note (Signed)
Chronic, I will increase dose of Ozempic to 0.5mg  weekly. He will rto in 2 months for his next DM check. He is reminded to optimize his protein intake to ensure he does not have muscle wasting. No labs are needed today, June 2024 labs reviewed in detail.

## 2023-08-01 NOTE — Assessment & Plan Note (Signed)
He was congratulated on his 8lb weight loss since June 2024. He is encouraged to aim for at least 150 minutes of exercise/week.

## 2023-09-14 ENCOUNTER — Other Ambulatory Visit: Payer: Self-pay | Admitting: Internal Medicine

## 2023-09-14 DIAGNOSIS — E1169 Type 2 diabetes mellitus with other specified complication: Secondary | ICD-10-CM

## 2023-09-15 ENCOUNTER — Other Ambulatory Visit: Payer: Self-pay | Admitting: Internal Medicine

## 2023-10-08 ENCOUNTER — Other Ambulatory Visit: Payer: Self-pay

## 2023-10-08 DIAGNOSIS — E1169 Type 2 diabetes mellitus with other specified complication: Secondary | ICD-10-CM

## 2023-10-08 MED ORDER — SEMAGLUTIDE(0.25 OR 0.5MG/DOS) 2 MG/3ML ~~LOC~~ SOPN
PEN_INJECTOR | SUBCUTANEOUS | 3 refills | Status: DC
Start: 2023-10-08 — End: 2024-02-15

## 2023-10-19 ENCOUNTER — Ambulatory Visit (INDEPENDENT_AMBULATORY_CARE_PROVIDER_SITE_OTHER): Payer: 59 | Admitting: Internal Medicine

## 2023-10-19 ENCOUNTER — Other Ambulatory Visit: Payer: Self-pay | Admitting: Internal Medicine

## 2023-10-19 ENCOUNTER — Encounter: Payer: Self-pay | Admitting: Internal Medicine

## 2023-10-19 VITALS — BP 120/76 | HR 54 | Temp 98.7°F | Ht 73.0 in | Wt 244.0 lb

## 2023-10-19 DIAGNOSIS — E039 Hypothyroidism, unspecified: Secondary | ICD-10-CM | POA: Diagnosis not present

## 2023-10-19 DIAGNOSIS — E785 Hyperlipidemia, unspecified: Secondary | ICD-10-CM

## 2023-10-19 DIAGNOSIS — R202 Paresthesia of skin: Secondary | ICD-10-CM | POA: Insufficient documentation

## 2023-10-19 DIAGNOSIS — Z23 Encounter for immunization: Secondary | ICD-10-CM

## 2023-10-19 DIAGNOSIS — I119 Hypertensive heart disease without heart failure: Secondary | ICD-10-CM

## 2023-10-19 DIAGNOSIS — E1169 Type 2 diabetes mellitus with other specified complication: Secondary | ICD-10-CM | POA: Insufficient documentation

## 2023-10-19 DIAGNOSIS — E66811 Obesity, class 1: Secondary | ICD-10-CM

## 2023-10-19 DIAGNOSIS — Z6832 Body mass index (BMI) 32.0-32.9, adult: Secondary | ICD-10-CM

## 2023-10-19 DIAGNOSIS — E6609 Other obesity due to excess calories: Secondary | ICD-10-CM

## 2023-10-19 NOTE — Progress Notes (Signed)
I,[name], CMA,acting as a Neurosurgeon for [name] Aliment, MD.,have documented all relevant documentation on the behalf of [name] Aliment, MD,as directed by  [name] Aliment, MD while in the presence of [name] Aliment, MD.  Subjective:  Patient ID: [name] Hammond , male    DOB: 02/04/1961 , 62 y.o.   MRN: [id number]  Chief Complaint  Patient presents with   Diabetes   Hyperlipidemia   Hypertension    HPI  Patient presents today for a diabetes and blood pressure check. Patient reports compliance with his meds. Patient denies having chest pain, headaches or sob. Patient does not have any questions or concerns at this time.    Diabetes He presents for his follow-up diabetic visit. He has type 2 diabetes mellitus. There are no hypoglycemic associated symptoms. Pertinent negatives for diabetes include no blurred vision, no chest pain, no polydipsia, no polyphagia and no polyuria. There are no hypoglycemic complications. Diabetic complications include heart disease. Risk factors for coronary artery disease include diabetes mellitus, dyslipidemia, male sex, hypertension, obesity and sedentary lifestyle. He participates in exercise intermittently. His breakfast blood glucose is taken between 8-9 am. His breakfast blood glucose range is generally 90-110 mg/dl. An ACE inhibitor/angiotensin II receptor blocker is being taken. He does not see a podiatrist.Eye exam is current.  Hyperlipidemia Pertinent negatives include no chest pain or shortness of breath.  Hypertension This is a chronic problem. The current episode started more than 1 year ago. The problem has been gradually improving since onset. The problem is controlled. Pertinent negatives include no blurred vision, chest pain, palpitations or shortness of breath. Risk factors for coronary artery disease include diabetes mellitus, dyslipidemia, obesity, sedentary lifestyle and male gender. The current treatment provides moderate improvement.  Compliance problems include exercise.  Hypertensive end-organ damage includes CAD/MI.     Past Medical History:  Diagnosis Date   Anxiety    Coronary artery disease    Diabetes mellitus without complication (HCC)    type 2    Hypertension    Hypothyroid    Obstructive sleep apnea    CPAP   Pure hypercholesterolemia    PVD (peripheral vascular disease) (HCC)    Testicular hypofunction    Vitamin D deficiency      Family History  Problem Relation Age of Onset   Diabetes type II Mother    Thyroid disease Mother    Cancer - Other Father        Liver   Hypertension Father    Heart disease Father        Bypass surgery   Thyroid disease Sister    Diabetes type II Brother    Diabetes type II Brother      Current Outpatient Medications:    Accu-Chek Softclix Lancets lancets, USE ONE LANCET IN MORNING, ONE AT NOON AND ONE AT BEDTIME, Disp: 100 each, Rfl: 1   aspirin 81 MG EC tablet, Take 4 tablets (325 mg total) by mouth daily. 1 tablet daily, Disp: , Rfl:    atorvastatin (LIPITOR) 80 MG tablet, TAKE 1 TABLET(80 MG) BY MOUTH AT BEDTIME, Disp: 90 tablet, Rfl: 2   Blood Glucose Monitoring Suppl DEVI, 1 each by Does not apply route in the morning, at noon, and at bedtime. May substitute to any manufacturer covered by patient's insurance., Disp: 1 each, Rfl: 0   Dapagliflozin Pro-metFORMIN ER 09-999 MG TB24, TAKE 1 TABLET BY MOUTH DAILY, Disp: 90 tablet, Rfl: 1   levothyroxine (SYNTHROID) 100 MCG  tablet, TAKE 1 TABLET(100 MCG) BY MOUTH DAILY, Disp: 90 tablet, Rfl: 0   lisinopril-hydrochlorothiazide (ZESTORETIC) 20-12.5 MG tablet, Take 1 tablet by mouth daily., Disp: 90 tablet, Rfl: 1   metoprolol tartrate (LOPRESSOR) 25 MG tablet, TAKE 1/2 TABLET(12.5 MG) BY MOUTH TWICE DAILY, Disp: 90 tablet, Rfl: 1   Semaglutide,0.25 or 0.5MG /DOS, 2 MG/3ML SOPN, Inject 0.5mg  sq weeklly, Disp: 3 mL, Rfl: 3   sertraline (ZOLOFT) 50 MG tablet, TAKE 1 TABLET(50 MG) BY MOUTH DAILY, Disp: 90 tablet, Rfl: 1    No Known Allergies   Review of Systems  Constitutional: Negative.   Eyes: Negative.  Negative for blurred vision.  Respiratory: Negative.  Negative for shortness of breath.   Cardiovascular: Negative.  Negative for chest pain and palpitations.  Gastrointestinal: Negative.   Endocrine: Negative for polydipsia, polyphagia and polyuria.  Musculoskeletal: Negative.   Skin: Negative.   Neurological:  Positive for numbness.       He c/o numbness/tingling of b/l hands. Sx always occur at night. Sx improve with positional changes. There is no radiation.   Psychiatric/Behavioral: Negative.       Today's Vitals   10/19/23 1435  BP: 120/76  Pulse: (!) 54  Temp: 98.7 F (37.1 C)  Weight: 244 lb (110.7 kg)  Height: 6\' 1"  (1.854 m)  PainSc: 0-No pain   Body mass index is 32.19 kg/m.  Wt Readings from Last 3 Encounters:  10/19/23 244 lb (110.7 kg)  07/31/23 250 lb 3.2 oz (113.5 kg)  06/15/23 258 lb 6.4 oz (117.2 kg)    The ASCVD Risk score (Arnett DK, et al., 2019) failed to calculate for the following reasons:   The valid total cholesterol range is 130 to 320 mg/dL  Objective:  Physical Exam Vitals and nursing note reviewed.  Constitutional:      Appearance: Normal appearance. He is obese.  HENT:     Head: Normocephalic and atraumatic.  Eyes:     Extraocular Movements: Extraocular movements intact.  Cardiovascular:     Rate and Rhythm: Normal rate and regular rhythm.     Heart sounds: Normal heart sounds.  Pulmonary:     Effort: Pulmonary effort is normal.     Breath sounds: Normal breath sounds.  Musculoskeletal:     Cervical back: Normal range of motion.  Skin:    General: Skin is warm.  Neurological:     General: No focal deficit present.     Mental Status: He is alert.  Psychiatric:        Mood and Affect: Mood normal.         Assessment And Plan:  Dyslipidemia associated with type 2 diabetes mellitus (HCC) Assessment & Plan: Chronic, LDL goal is less than  70.  He will continue with atorvastatin 80mg  daily.  Regarding DM, he will continue with Ozempic 0.5mg  weekly and Xigduo 10/1000mg  daily. He will rto in 4 months for re-evaluation.   Orders: -     CMP14+EGFR -     Hemoglobin A1c  Benign hypertensive heart disease without heart failure Assessment & Plan: Chronic, well controlled.  He will continue with metoprolol tartrate 25mg  1/2 tab twice daily and lisinopril/hydrochlorothiazide 20/12.5mg  daily. He was congratulated on his new workout regimen and encouraged to keep it up.    Primary hypothyroidism Assessment & Plan: Chronic, he is currently taking generic Synthroid daily. I will check a thyroid panel and adjust meds as needed.   Orders: -     TSH + free T4  Paresthesia of both hands -     Vitamin B12  Class 1 obesity due to excess calories with serious comorbidity and body mass index (BMI) of 32.0 to 32.9 in adult Assessment & Plan: He was congratulated on his 6lb weight loss since August 2024 and 14 lbs weight loss since June 2024. Currently, his goal weight is 220lbs.    Immunization due -     Flu vaccine trivalent PF, 6mos and older(Flulaval,Afluria,Fluarix,Fluzone)    Return if symptoms worsen or fail to improve.  Patient was given opportunity to ask questions. Patient verbalized understanding of the plan and was able to repeat key elements of the plan. All questions were answered to their satisfaction.    I, [name] Aliment, MD, have reviewed all documentation for this visit. The documentation on 10/19/23 for the exam, diagnosis, procedures, and orders are all accurate and complete.  IF YOU HAVE BEEN REFERRED TO A SPECIALIST, IT MAY TAKE 1-2 WEEKS TO SCHEDULE/PROCESS THE REFERRAL. IF YOU HAVE NOT HEARD FROM US/SPECIALIST IN TWO WEEKS, PLEASE GIVE [place] A CALL AT [phone] X [phone].

## 2023-10-19 NOTE — Assessment & Plan Note (Signed)
Chronic, he is currently taking generic Synthroid daily. I will check a thyroid panel and adjust meds as needed.

## 2023-10-19 NOTE — Assessment & Plan Note (Signed)
He was congratulated on his 6lb weight loss since August 2024 and 14 lbs weight loss since June 2024. Currently, his goal weight is 220lbs.

## 2023-10-19 NOTE — Assessment & Plan Note (Signed)
Chronic, well controlled.  He will continue with metoprolol tartrate 25mg  1/2 tab twice daily and lisinopril/hydrochlorothiazide 20/12.5mg  daily. He was congratulated on his new workout regimen and encouraged to keep it up.

## 2023-10-19 NOTE — Assessment & Plan Note (Addendum)
Chronic, LDL goal is less than 70.  He will continue with atorvastatin 80mg  daily.  Regarding DM, he will continue with Ozempic 0.5mg  weekly and Xigduo 10/1000mg  daily. He will rto in 4 months for re-evaluation.

## 2023-10-20 ENCOUNTER — Other Ambulatory Visit: Payer: Self-pay | Admitting: Internal Medicine

## 2023-10-20 DIAGNOSIS — I119 Hypertensive heart disease without heart failure: Secondary | ICD-10-CM

## 2023-10-20 LAB — CMP14+EGFR
ALT: 12 [IU]/L (ref 0–44)
AST: 18 [IU]/L (ref 0–40)
Albumin: 4.2 g/dL (ref 3.9–4.9)
Alkaline Phosphatase: 87 [IU]/L (ref 44–121)
BUN/Creatinine Ratio: 22 (ref 10–24)
BUN: 19 mg/dL (ref 8–27)
Bilirubin Total: 0.7 mg/dL (ref 0.0–1.2)
CO2: 22 mmol/L (ref 20–29)
Calcium: 9.2 mg/dL (ref 8.6–10.2)
Chloride: 102 mmol/L (ref 96–106)
Creatinine, Ser: 0.87 mg/dL (ref 0.76–1.27)
Globulin, Total: 3 g/dL (ref 1.5–4.5)
Glucose: 89 mg/dL (ref 70–99)
Potassium: 4.9 mmol/L (ref 3.5–5.2)
Sodium: 139 mmol/L (ref 134–144)
Total Protein: 7.2 g/dL (ref 6.0–8.5)
eGFR: 98 mL/min/{1.73_m2} (ref >59)

## 2023-10-20 LAB — HEMOGLOBIN A1C
Est. average glucose Bld gHb Est-mCnc: 126 mg/dL
Hgb A1c MFr Bld: 6 % — ABNORMAL HIGH (ref 4.8–5.6)

## 2023-10-20 LAB — TSH+FREE T4
Free T4: 1.73 ng/dL (ref 0.82–1.77)
TSH: 1.23 u[IU]/mL (ref 0.450–4.500)

## 2023-10-20 LAB — VITAMIN B12: Vitamin B-12: 324 pg/mL (ref 232–1245)

## 2023-11-15 ENCOUNTER — Other Ambulatory Visit: Payer: Self-pay | Admitting: Internal Medicine

## 2023-12-11 ENCOUNTER — Other Ambulatory Visit: Payer: Self-pay | Admitting: Internal Medicine

## 2023-12-11 ENCOUNTER — Other Ambulatory Visit: Payer: Self-pay | Admitting: Nurse Practitioner

## 2023-12-11 DIAGNOSIS — I2510 Atherosclerotic heart disease of native coronary artery without angina pectoris: Secondary | ICD-10-CM

## 2023-12-11 DIAGNOSIS — E1169 Type 2 diabetes mellitus with other specified complication: Secondary | ICD-10-CM

## 2023-12-11 DIAGNOSIS — I119 Hypertensive heart disease without heart failure: Secondary | ICD-10-CM

## 2023-12-29 LAB — HM DIABETES EYE EXAM

## 2024-01-14 ENCOUNTER — Encounter: Payer: Self-pay | Admitting: Internal Medicine

## 2024-01-16 ENCOUNTER — Other Ambulatory Visit: Payer: Self-pay | Admitting: Internal Medicine

## 2024-01-17 ENCOUNTER — Other Ambulatory Visit: Payer: Self-pay | Admitting: Internal Medicine

## 2024-01-18 ENCOUNTER — Other Ambulatory Visit: Payer: Self-pay | Admitting: Internal Medicine

## 2024-02-11 ENCOUNTER — Encounter: Payer: Managed Care, Other (non HMO) | Admitting: Internal Medicine

## 2024-02-15 ENCOUNTER — Encounter: Payer: Self-pay | Admitting: Internal Medicine

## 2024-02-15 ENCOUNTER — Ambulatory Visit: Payer: 59 | Admitting: Internal Medicine

## 2024-02-15 VITALS — BP 100/70 | HR 71 | Temp 97.9°F | Ht 73.0 in | Wt 251.0 lb

## 2024-02-15 DIAGNOSIS — Z0000 Encounter for general adult medical examination without abnormal findings: Secondary | ICD-10-CM | POA: Diagnosis not present

## 2024-02-15 DIAGNOSIS — E66811 Obesity, class 1: Secondary | ICD-10-CM

## 2024-02-15 DIAGNOSIS — I119 Hypertensive heart disease without heart failure: Secondary | ICD-10-CM | POA: Diagnosis not present

## 2024-02-15 DIAGNOSIS — I2510 Atherosclerotic heart disease of native coronary artery without angina pectoris: Secondary | ICD-10-CM

## 2024-02-15 DIAGNOSIS — Z6833 Body mass index (BMI) 33.0-33.9, adult: Secondary | ICD-10-CM

## 2024-02-15 DIAGNOSIS — E1169 Type 2 diabetes mellitus with other specified complication: Secondary | ICD-10-CM

## 2024-02-15 DIAGNOSIS — Z23 Encounter for immunization: Secondary | ICD-10-CM | POA: Diagnosis not present

## 2024-02-15 DIAGNOSIS — M549 Dorsalgia, unspecified: Secondary | ICD-10-CM

## 2024-02-15 DIAGNOSIS — E6609 Other obesity due to excess calories: Secondary | ICD-10-CM

## 2024-02-15 DIAGNOSIS — E039 Hypothyroidism, unspecified: Secondary | ICD-10-CM

## 2024-02-15 DIAGNOSIS — E785 Hyperlipidemia, unspecified: Secondary | ICD-10-CM

## 2024-02-15 LAB — POCT URINALYSIS DIPSTICK
Bilirubin, UA: NEGATIVE
Blood, UA: NEGATIVE
Glucose, UA: POSITIVE — AB
Ketones, UA: NEGATIVE
Leukocytes, UA: NEGATIVE
Nitrite, UA: NEGATIVE
Protein, UA: NEGATIVE
Spec Grav, UA: 1.015 (ref 1.010–1.025)
Urobilinogen, UA: 0.2 U/dL
pH, UA: 7 (ref 5.0–8.0)

## 2024-02-15 MED ORDER — SEMAGLUTIDE(0.25 OR 0.5MG/DOS) 2 MG/3ML ~~LOC~~ SOPN: PEN_INJECTOR | SUBCUTANEOUS | 3 refills | Status: DC

## 2024-02-15 MED ORDER — DAPAGLIFLOZIN PRO-METFORMIN ER 10-1000 MG PO TB24: 1.0000 | ORAL_TABLET | Freq: Every day | ORAL | 1 refills | Status: DC

## 2024-02-15 MED ORDER — METOPROLOL TARTRATE 25 MG PO TABS: ORAL_TABLET | ORAL | 1 refills | Status: DC

## 2024-02-15 NOTE — Assessment & Plan Note (Signed)
 Chronic, well controlled.  EKG performed, SB w/ low voltage. He will continue with metoprolol tartrate 25mg  1/2 tab twice daily and lisinopril/hydrochlorothiazide 20/12.5mg  daily.  He is reminded to follow a low sodium diet and to follow a heart healthy lifestyle.

## 2024-02-15 NOTE — Assessment & Plan Note (Signed)
 His goal weight is 220lbs. He is encouraged to initially strive for BMI less than 30 to decrease cardiac risk. He is advised to exercise no less than 150 minutes per week.

## 2024-02-15 NOTE — Assessment & Plan Note (Signed)
 Chronic, LDL goal is less than 70. He will continue with atorvastatin 80mg  daily.  Regarding DM, diabetic foot exam was performed.  He will continue with Ozempic 0.5mg  weekly and Xigduo 10/1000mg  daily. He will rto in 4 months for re-evaluation. I DISCUSSED WITH THE PATIENT AT LENGTH REGARDING THE GOALS OF GLYCEMIC CONTROL AND POSSIBLE LONG-TERM COMPLICATIONS.  I  ALSO STRESSED THE IMPORTANCE OF COMPLIANCE WITH HOME GLUCOSE MONITORING, DIETARY RESTRICTIONS INCLUDING AVOIDANCE OF SUGARY DRINKS/PROCESSED FOODS,  ALONG WITH REGULAR EXERCISE.  I  ALSO STRESSED THE IMPORTANCE OF ANNUAL EYE EXAMS, SELF FOOT CARE AND COMPLIANCE WITH OFFICE VISITS.

## 2024-02-15 NOTE — Assessment & Plan Note (Signed)

## 2024-02-15 NOTE — Patient Instructions (Signed)
 Health Maintenance, Male  Adopting a healthy lifestyle and getting preventive care are important in promoting health and wellness. Ask your health care provider about:  The right schedule for you to have regular tests and exams.  Things you can do on your own to prevent diseases and keep yourself healthy.  What should I know about diet, weight, and exercise?  Eat a healthy diet    Eat a diet that includes plenty of vegetables, fruits, low-fat dairy products, and lean protein.  Do not eat a lot of foods that are high in solid fats, added sugars, or sodium.  Maintain a healthy weight  Body mass index (BMI) is a measurement that can be used to identify possible weight problems. It estimates body fat based on height and weight. Your health care provider can help determine your BMI and help you achieve or maintain a healthy weight.  Get regular exercise  Get regular exercise. This is one of the most important things you can do for your health. Most adults should:  Exercise for at least 150 minutes each week. The exercise should increase your heart rate and make you sweat (moderate-intensity exercise).  Do strengthening exercises at least twice a week. This is in addition to the moderate-intensity exercise.  Spend less time sitting. Even light physical activity can be beneficial.  Watch cholesterol and blood lipids  Have your blood tested for lipids and cholesterol at 63 years of age, then have this test every 5 years.  You may need to have your cholesterol levels checked more often if:  Your lipid or cholesterol levels are high.  You are older than 63 years of age.  You are at high risk for heart disease.  What should I know about cancer screening?  Many types of cancers can be detected early and may often be prevented. Depending on your health history and family history, you may need to have cancer screening at various ages. This may include screening for:  Colorectal cancer.  Prostate cancer.  Skin cancer.  Lung  cancer.  What should I know about heart disease, diabetes, and high blood pressure?  Blood pressure and heart disease  High blood pressure causes heart disease and increases the risk of stroke. This is more likely to develop in people who have high blood pressure readings or are overweight.  Talk with your health care provider about your target blood pressure readings.  Have your blood pressure checked:  Every 3-5 years if you are 9-95 years of age.  Every year if you are 85 years old or older.  If you are between the ages of 29 and 29 and are a current or former smoker, ask your health care provider if you should have a one-time screening for abdominal aortic aneurysm (AAA).  Diabetes  Have regular diabetes screenings. This checks your fasting blood sugar level. Have the screening done:  Once every three years after age 23 if you are at a normal weight and have a low risk for diabetes.  More often and at a younger age if you are overweight or have a high risk for diabetes.  What should I know about preventing infection?  Hepatitis B  If you have a higher risk for hepatitis B, you should be screened for this virus. Talk with your health care provider to find out if you are at risk for hepatitis B infection.  Hepatitis C  Blood testing is recommended for:  Everyone born from [birth year] through 1965.  Anyone  with known risk factors for hepatitis C.  Sexually transmitted infections (STIs)  You should be screened each year for STIs, including gonorrhea and chlamydia, if:  You are sexually active and are younger than 62 years of age.  You are older than 63 years of age and your health care provider tells you that you are at risk for this type of infection.  Your sexual activity has changed since you were last screened, and you are at increased risk for chlamydia or gonorrhea. Ask your health care provider if you are at risk.  Ask your health care provider about whether you are at high risk for HIV. Your health care provider  may recommend a prescription medicine to help prevent HIV infection. If you choose to take medicine to prevent HIV, you should first get tested for HIV. You should then be tested every 3 months for as long as you are taking the medicine.  Follow these instructions at home:  Alcohol use  Do not drink alcohol if your health care provider tells you not to drink.  If you drink alcohol:  Limit how much you have to 0-2 drinks a day.  Know how much alcohol is in your drink. In the [place] one drink equals one 12 oz bottle of beer (355 mL), one 5 oz glass of wine (148 mL), or one 1 oz glass of hard liquor (44 mL).  Lifestyle  Do not use any products that contain nicotine or tobacco. These products include cigarettes, chewing tobacco, and vaping devices, such as e-cigarettes. If you need help quitting, ask your health care provider.  Do not use street drugs.  Do not share needles.  Ask your health care provider for help if you need support or information about quitting drugs.  General instructions  Schedule regular health, dental, and eye exams.  Stay current with your vaccines.  Tell your health care provider if:  You often feel depressed.  You have ever been abused or do not feel safe at home.  Summary  Adopting a healthy lifestyle and getting preventive care are important in promoting health and wellness.  Follow your health care provider's instructions about healthy diet, exercising, and getting tested or screened for diseases.  Follow your health care provider's instructions on monitoring your cholesterol and blood pressure.  This information is not intended to replace advice given to you by your health care provider. Make sure you discuss any questions you have with your health care provider.  Document Revised: 04/29/2021 Document Reviewed: 04/29/2021  Elsevier Patient Education  2024 [employer].

## 2024-02-15 NOTE — Assessment & Plan Note (Signed)
 Chronic, he is currently taking generic Synthroid daily. I will check a TSHand adjust meds as needed.

## 2024-02-15 NOTE — Assessment & Plan Note (Signed)
 Chronic, he is s/p CABG.  Importance of dietary and medication compliance was discussed with the patient.  He will continue with ASA, atorvastatin 80mg  and metoprolol.

## 2024-02-15 NOTE — Progress Notes (Signed)
 I,Victoria T Deloria Lair, CMA,acting as a Neurosurgeon for Gwynneth Aliment, MD.,have documented all relevant documentation on the behalf of Gwynneth Aliment, MD,as directed by  Gwynneth Aliment, MD while in the presence of Gwynneth Aliment, MD.  Subjective:   Patient ID: [name] Hammond , male    DOB: [birth year] , 63 y.o.   MRN: [id number]  Chief Complaint  Patient presents with   Annual Exam   Diabetes   Hypertension    HPI  He is here today for a full physical examination. He reports compliance with meds. He denies headaches, chest pain and shortness of breath. He admits he has not been exercising as much as he has in the past.     Diabetes He presents for his follow-up diabetic visit. He has type 2 diabetes mellitus. There are no hypoglycemic associated symptoms. Pertinent negatives for diabetes include no blurred vision. There are no hypoglycemic complications. Diabetic complications include heart disease. Risk factors for coronary artery disease include diabetes mellitus, dyslipidemia, male sex, hypertension, obesity and sedentary lifestyle. He is following a diabetic diet. He has had a previous visit with a dietitian. He participates in exercise intermittently. His breakfast blood glucose is taken between 8-9 am. His breakfast blood glucose range is generally 90-110 mg/dl. An ACE inhibitor/angiotensin II receptor blocker is being taken. He does not see a podiatrist.Eye exam is current.  Hypertension This is a chronic problem. The current episode started more than 1 year ago. The problem has been gradually improving since onset. The problem is controlled. Pertinent negatives include no blurred vision. Risk factors for coronary artery disease include diabetes mellitus, dyslipidemia, obesity and sedentary lifestyle. The current treatment provides moderate improvement. Compliance problems include exercise.  Hypertensive end-organ damage includes CAD/MI.     Past Medical History:  Diagnosis Date    Anxiety    Coronary artery disease    Diabetes mellitus without complication (HCC)    type 2    Hypertension    Hypothyroid    Obstructive sleep apnea    CPAP   Pure hypercholesterolemia    PVD (peripheral vascular disease) (HCC)    Testicular hypofunction    Vitamin D deficiency      Family History  Problem Relation Age of Onset   Diabetes type II Mother    Thyroid disease Mother    Cancer - Other Father        Liver   Hypertension Father    Heart disease Father        Bypass surgery   Thyroid disease Sister    Diabetes type II Brother    Diabetes type II Brother      Current Outpatient Medications:    Accu-Chek Softclix Lancets lancets, USE ONE LANCET IN MORNING, ONE AT NOON AND ONE AT BEDTIME, Disp: 100 each, Rfl: 1   aspirin 81 MG EC tablet, Take 4 tablets (325 mg total) by mouth daily. 1 tablet daily, Disp: , Rfl:    atorvastatin (LIPITOR) 80 MG tablet, TAKE 1 TABLET(80 MG) BY MOUTH AT BEDTIME, Disp: 90 tablet, Rfl: 2   Blood Glucose Monitoring Suppl DEVI, 1 each by Does not apply route in the morning, at noon, and at bedtime. May substitute to any manufacturer covered by patient's insurance., Disp: 1 each, Rfl: 0   levothyroxine (SYNTHROID) 100 MCG tablet, TAKE 1 TABLET(100 MCG) BY MOUTH DAILY, Disp: 90 tablet, Rfl: 2   lisinopril-hydrochlorothiazide (ZESTORETIC) 20-12.5 MG tablet, TAKE 1 TABLET BY MOUTH DAILY, Disp: 90 tablet,  Rfl: 1   sertraline (ZOLOFT) 50 MG tablet, TAKE 1 TABLET BY MOUTH EVERY DAY, Disp: 90 tablet, Rfl: 1   Dapagliflozin Pro-metFORMIN ER 09-999 MG TB24, Take 1 tablet by mouth daily., Disp: 90 tablet, Rfl: 1   metoprolol tartrate (LOPRESSOR) 25 MG tablet, TAKE 1/2 TABLET(12.5 MG) BY MOUTH TWICE DAILY, Disp: 90 tablet, Rfl: 1   Semaglutide,0.25 or 0.5MG /DOS, 2 MG/3ML SOPN, Inject 0.5mg  sq weeklly, Disp: 3 mL, Rfl: 3   No Known Allergies   Men's preventive visit. Patient Health Questionnaire (PHQ-2) is  Flowsheet Row Office Visit from 02/15/2024 in  [facility]  PHQ-2 Total Score 0     . Patient is on a diabetic diet. Marital status: Married. Relevant history for alcohol use is:  Social History   Substance and Sexual Activity  Alcohol Use No  . Relevant history for tobacco use is:  Social History   Tobacco Use  Smoking Status Never  Smokeless Tobacco Never  .   Review of Systems  Constitutional: Negative.   HENT: Negative.    Eyes: Negative.  Negative for blurred vision.  Respiratory: Negative.    Cardiovascular: Negative.   Gastrointestinal: Negative.   Endocrine: Negative.   Genitourinary: Negative.   Musculoskeletal: Negative.   Skin: Negative.   Allergic/Immunologic: Negative.   Neurological: Negative.   Hematological: Negative.   Psychiatric/Behavioral: Negative.       Today's Vitals   02/15/24 1405  BP: 100/70  Pulse: 71  Temp: 97.9 F (36.6 C)  TempSrc: Oral  Weight: 251 lb (113.9 kg)  Height: 6\' 1"  (1.854 m)  PainSc: 0-No pain   Body mass index is 33.12 kg/m.  Wt Readings from Last 3 Encounters:  02/15/24 251 lb (113.9 kg)  10/19/23 244 lb (110.7 kg)  07/31/23 250 lb 3.2 oz (113.5 kg)   BP Readings from Last 3 Encounters:  02/15/24 100/70  10/19/23 120/76  07/31/23 104/70    Objective:  Physical Exam Vitals and nursing note reviewed.  Constitutional:      Appearance: Normal appearance. He is obese.  HENT:     Head: Normocephalic and atraumatic.     Right Ear: Tympanic membrane, ear canal and external ear normal.     Left Ear: Tympanic membrane, ear canal and external ear normal.  Eyes:     Extraocular Movements: Extraocular movements intact.     Conjunctiva/sclera: Conjunctivae normal.     Pupils: Pupils are equal, round, and reactive to light.  Cardiovascular:     Rate and Rhythm: Normal rate and regular rhythm.     Pulses: Normal pulses.          Dorsalis pedis pulses are 2+ on the right side and 2+ on the left side.     Heart sounds: Normal  heart sounds.  Pulmonary:     Effort: Pulmonary effort is normal.     Breath sounds: Normal breath sounds.  Chest:  Breasts:    Right: Normal. No swelling, bleeding, inverted nipple, mass or nipple discharge.     Left: Normal. No swelling, bleeding, inverted nipple, mass or nipple discharge.     Comments: Healed scars b/l breasts healed midsternal scar Abdominal:     General: Abdomen is flat. Bowel sounds are normal.     Palpations: Abdomen is soft.  Genitourinary:    Comments: Deferred, per patient request Musculoskeletal:        General: Normal range of motion.     Cervical back: Normal range of motion  and neck supple.  Feet:     Right foot:     Protective Sensation: 5 sites tested.  5 sites sensed.     Skin integrity: Dry skin present.     Toenail Condition: Right toenails are normal.     Left foot:     Protective Sensation: 5 sites tested.  5 sites sensed.     Skin integrity: Dry skin present.     Toenail Condition: Left toenails are normal.  Skin:    General: Skin is warm.     Comments: Abrasions b/l knees  Neurological:     General: No focal deficit present.     Mental Status: He is alert.  Psychiatric:        Mood and Affect: Mood normal.        Behavior: Behavior normal.         Assessment And Plan:    Routine general medical examination at health care facility Assessment & Plan: A full exam was performed.  DRE deferred, per patient request.  He is advised to get 30-45 minutes of regular exercise, no less than four to five days per week. Both weight-bearing and aerobic exercises are recommended.  He is advised to follow a healthy diet with at least six fruits/veggies per day, decrease intake of red meat and other saturated fats and to increase fish intake to twice weekly.  Meats/fish should not be fried -- baked, boiled or broiled is preferable. It is also important to cut back on your sugar intake.  Be sure to read labels - try to avoid anything with added sugar,  high fructose corn syrup or other sweeteners.  If you must use a sweetener, you can try stevia or monkfruit.  It is also important to avoid artificially sweetened foods/beverages and diet drinks. Lastly, wear SPF 50 sunscreen on exposed skin and when in direct sunlight for an extended period of time.  Be sure to avoid fast food restaurants and aim for at least 60 ounces of water daily.      Orders: -     CMP14+EGFR -     Lipid panel -     CBC -     PSA Total (Reflex To Free)  Dyslipidemia associated with type 2 diabetes mellitus (HCC) Assessment & Plan: Chronic, LDL goal is less than 70. He will continue with atorvastatin 80mg  daily.  Regarding DM, diabetic foot exam was performed.  He will continue with Ozempic 0.5mg  weekly and Xigduo 10/1000mg  daily. He will rto in 4 months for re-evaluation. I DISCUSSED WITH THE PATIENT AT LENGTH REGARDING THE GOALS OF GLYCEMIC CONTROL AND POSSIBLE LONG-TERM COMPLICATIONS.  I  ALSO STRESSED THE IMPORTANCE OF COMPLIANCE WITH HOME GLUCOSE MONITORING, DIETARY RESTRICTIONS INCLUDING AVOIDANCE OF SUGARY DRINKS/PROCESSED FOODS,  ALONG WITH REGULAR EXERCISE.  I  ALSO STRESSED THE IMPORTANCE OF ANNUAL EYE EXAMS, SELF FOOT CARE AND COMPLIANCE WITH OFFICE VISITS.   Orders: -     Hemoglobin A1c -     POCT urinalysis dipstick -     Microalbumin / creatinine urine ratio -     Dapagliflozin Pro-metFORMIN ER; Take 1 tablet by mouth daily.  Dispense: 90 tablet; Refill: 1 -     Semaglutide(0.25 or 0.5MG /DOS); Inject 0.5mg  sq weeklly  Dispense: 3 mL; Refill: 3  Benign hypertensive heart disease without heart failure Assessment & Plan: Chronic, well controlled.  EKG performed, SB w/ low voltage. He will continue with metoprolol tartrate 25mg  1/2 tab twice daily and lisinopril/hydrochlorothiazide 20/12.5mg  daily.  He is reminded to follow a low sodium diet and to follow a heart healthy lifestyle.  Orders: -     EKG 12-Lead -     Metoprolol Tartrate; TAKE 1/2 TABLET(12.5  MG) BY MOUTH TWICE DAILY  Dispense: 90 tablet; Refill: 1  Coronary artery disease involving native coronary artery of native heart without angina pectoris Assessment & Plan: Chronic, he is s/p CABG.  Importance of dietary and medication compliance was discussed with the patient.  He will continue with ASA, atorvastatin 80mg  and metoprolol.    Primary hypothyroidism Assessment & Plan: Chronic, he is currently taking generic Synthroid daily. I will check a TSHand adjust meds as needed.   Orders: -     TSH  Class 1 obesity due to excess calories with serious comorbidity and body mass index (BMI) of 33.0 to 33.9 in adult Assessment & Plan: His goal weight is 220lbs. He is encouraged to initially strive for BMI less than 30 to decrease cardiac risk. He is advised to exercise no less than 150 minutes per week.     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 Patient was given opportunity to ask questions. Patient verbalized understanding of the plan and was able to repeat key elements of the plan. All questions were answered to their satisfaction.   I, Gwynneth Aliment, MD, have reviewed all documentation for this visit. The documentation on 02/15/24 for the exam, diagnosis, procedures, and orders are all accurate and complete.

## 2024-02-16 LAB — CMP14+EGFR
ALT: 8 [IU]/L (ref 0–44)
AST: 13 [IU]/L (ref 0–40)
Albumin: 4.3 g/dL (ref 3.9–4.9)
Alkaline Phosphatase: 78 [IU]/L (ref 44–121)
BUN/Creatinine Ratio: 15 (ref 10–24)
BUN: 12 mg/dL (ref 8–27)
Bilirubin Total: 0.5 mg/dL (ref 0.0–1.2)
CO2: 23 mmol/L (ref 20–29)
Calcium: 9 mg/dL (ref 8.6–10.2)
Chloride: 104 mmol/L (ref 96–106)
Creatinine, Ser: 0.82 mg/dL (ref 0.76–1.27)
Globulin, Total: 3 g/dL (ref 1.5–4.5)
Glucose: 83 mg/dL (ref 70–99)
Potassium: 5 mmol/L (ref 3.5–5.2)
Sodium: 142 mmol/L (ref 134–144)
Total Protein: 7.3 g/dL (ref 6.0–8.5)
eGFR: 99 mL/min/{1.73_m2} (ref >59)

## 2024-02-16 LAB — PSA TOTAL (REFLEX TO FREE): Prostate Specific Ag, Serum: 0.4 ng/mL (ref 0.0–4.0)

## 2024-02-16 LAB — LIPID PANEL
Chol/HDL Ratio: 2.8 {ratio} (ref 0.0–5.0)
Cholesterol, Total: 117 mg/dL (ref 100–199)
HDL: 42 mg/dL (ref >39)
LDL Chol Calc (NIH): 64 mg/dL (ref 0–99)
Triglycerides: 47 mg/dL (ref 0–149)
VLDL Cholesterol Cal: 11 mg/dL (ref 5–40)

## 2024-02-16 LAB — MICROALBUMIN / CREATININE URINE RATIO
Creatinine, Urine: 148.3 mg/dL
Microalb/Creat Ratio: 5 mg/g{creat} (ref 0–29)
Microalbumin, Urine: 7.4 ug/mL

## 2024-02-16 LAB — CBC
Hematocrit: 46.1 % (ref 37.5–51.0)
Hemoglobin: 15.3 g/dL (ref 13.0–17.7)
MCH: 30.2 pg (ref 26.6–33.0)
MCHC: 33.2 g/dL (ref 31.5–35.7)
MCV: 91 fL (ref 79–97)
Platelets: 271 10*3/uL (ref 150–450)
RBC: 5.06 x10E6/uL (ref 4.14–5.80)
RDW: 13.1 % (ref 11.6–15.4)
WBC: 6.3 10*3/uL (ref 3.4–10.8)

## 2024-02-16 LAB — TSH: TSH: 0.694 u[IU]/mL (ref 0.450–4.500)

## 2024-02-16 LAB — HEMOGLOBIN A1C
Est. average glucose Bld gHb Est-mCnc: 117 mg/dL
Hgb A1c MFr Bld: 5.7 % — ABNORMAL HIGH (ref 4.8–5.6)

## 2024-03-15 ENCOUNTER — Ambulatory Visit (INDEPENDENT_AMBULATORY_CARE_PROVIDER_SITE_OTHER): Payer: 59

## 2024-03-15 VITALS — BP 100/70 | HR 98 | Temp 98.6°F | Ht 73.0 in | Wt 251.0 lb

## 2024-03-15 DIAGNOSIS — Z23 Encounter for immunization: Secondary | ICD-10-CM

## 2024-03-15 NOTE — Progress Notes (Signed)
 Patient presents today for pneumonia  vaccine. They deny symptoms of recent infection, fever, and chills. They received injection in L arm.

## 2024-03-20 ENCOUNTER — Other Ambulatory Visit: Payer: Self-pay | Admitting: Internal Medicine

## 2024-04-16 ENCOUNTER — Other Ambulatory Visit: Payer: Self-pay | Admitting: Internal Medicine

## 2024-04-16 DIAGNOSIS — I119 Hypertensive heart disease without heart failure: Secondary | ICD-10-CM

## 2024-05-29 ENCOUNTER — Other Ambulatory Visit: Payer: Self-pay | Admitting: Internal Medicine

## 2024-06-09 ENCOUNTER — Other Ambulatory Visit: Payer: Self-pay | Admitting: Internal Medicine

## 2024-06-09 DIAGNOSIS — I119 Hypertensive heart disease without heart failure: Secondary | ICD-10-CM

## 2024-06-14 ENCOUNTER — Encounter: Payer: Self-pay | Admitting: Internal Medicine

## 2024-06-14 ENCOUNTER — Telehealth: Payer: Self-pay

## 2024-06-14 ENCOUNTER — Ambulatory Visit (INDEPENDENT_AMBULATORY_CARE_PROVIDER_SITE_OTHER): Payer: Self-pay | Admitting: Internal Medicine

## 2024-06-14 VITALS — BP 120/82 | HR 72 | Temp 98.2°F | Ht 73.0 in | Wt 247.0 lb

## 2024-06-14 DIAGNOSIS — E1169 Type 2 diabetes mellitus with other specified complication: Secondary | ICD-10-CM | POA: Diagnosis not present

## 2024-06-14 DIAGNOSIS — E66811 Obesity, class 1: Secondary | ICD-10-CM

## 2024-06-14 DIAGNOSIS — E6609 Other obesity due to excess calories: Secondary | ICD-10-CM

## 2024-06-14 DIAGNOSIS — E785 Hyperlipidemia, unspecified: Secondary | ICD-10-CM

## 2024-06-14 DIAGNOSIS — I119 Hypertensive heart disease without heart failure: Secondary | ICD-10-CM

## 2024-06-14 DIAGNOSIS — I2510 Atherosclerotic heart disease of native coronary artery without angina pectoris: Secondary | ICD-10-CM | POA: Diagnosis not present

## 2024-06-14 DIAGNOSIS — Z6832 Body mass index (BMI) 32.0-32.9, adult: Secondary | ICD-10-CM

## 2024-06-14 DIAGNOSIS — E039 Hypothyroidism, unspecified: Secondary | ICD-10-CM

## 2024-06-14 NOTE — Assessment & Plan Note (Signed)
 Chronic, he is currently taking generic Synthroid daily. I will check a TSHand adjust meds as needed.

## 2024-06-14 NOTE — Assessment & Plan Note (Addendum)
 Diastolic blood pressure is slightly above target. Missed medication today may have affected reading. Regular medication adherence is crucial. - Encourage adherence to blood pressure medications including metoprolol  and lisinopril . - Monitor blood pressure regularly. - He will continue with metoprolol  tartrate 25mg  1/2 tab twice daily and lisinopril /hydrochlorothiazide  20/12.5mg  daily.   - He is reminded to follow a low sodium diet and to follow a heart healthy lifestyle.

## 2024-06-14 NOTE — Assessment & Plan Note (Addendum)
 Chronic, LDL goal is less than 70. He will continue with atorvastatin  80mg  daily.  Regarding DM, diabetic foot exam was performed.  He will continue with Ozempic  0.5mg  weekly and Xigduo  10/1000mg  daily. He will rto in 4 months for re-evaluation. He has not been regularly monitoring his blood glucose levels. Emphasized consistent monitoring and medication adherence to prevent complications. - Check hemoglobin A1c today. - Encourage regular blood glucose monitoring with Dexcom G7. - Reinforce adherence to diabetes medications including Ozempic  and Xigduo . - Encouraged to resume his regular exercise schedule to avoid muscle atrophy

## 2024-06-14 NOTE — Assessment & Plan Note (Signed)
 HE has lost 4lbs since his last visit! He is encouraged to strive for BMI less than 30 to decrease cardiac risk. Advised to aim for at least 150 minutes of exercise per week.

## 2024-06-14 NOTE — Patient Instructions (Signed)

## 2024-06-14 NOTE — Assessment & Plan Note (Signed)
 Chronic, he is s/p CABG.  Importance of dietary and medication compliance was discussed with the patient.  He will continue with ASA, atorvastatin 80mg  and metoprolol.

## 2024-06-14 NOTE — Progress Notes (Signed)
 I,[name], CMA,acting as a Neurosurgeon for [name], MD.,have documented all relevant documentation on the behalf of [name], MD,as directed by  [name], MD while in the presence of [name], MD.  Subjective:  Patient ID: [name] Hammond , male    DOB: Aug 11, 1961 , 63 y.o.   MRN: [id number]  Chief Complaint  Patient presents with   Hypertension   Diabetes    Patient presents today for a diabetes and blood pressure check. Patient reports compliance with his meds. Patient denies having chest pain, headaches or sob. Patient does not have any questions or concerns at this time.      HPI Discussed the use of AI scribe software for clinical note transcription with the patient, who gave verbal consent to proceed.  History of Present Illness Presten Joost is a 63 year old male with diabetes who presents for a diabetes check.  His blood sugar levels have been stable, although he has not been checking them regularly due to misplacing his Dexcom G7 glucose monitoring device. He has been unable to resolve this issue with the pharmacy.  He did not take his medications today as he was unsure if he should take them before the visit. His current medications include sertraline  50 mg daily, Ozempic  0.5 mg, metoprolol  25 mg half a tablet twice daily, lisinopril  20/12.5 mg daily, levothyroxine  100 mcg daily, Xigduo  (Farxiga and metformin ) 09/999 mg daily, atorvastatin  80 mg, and aspirin . He usually takes his medications with his first meal of the day, but he has not eaten a full meal today, only drinking a Boost nutritional drink.  He recently visited a urologist where he underwent lab tests, including prostate and testosterone  levels, but he is unsure if the results have been communicated to his current provider.  He exercises once or twice a week, which is less frequent than before.   Diabetes He presents for his follow-up diabetic visit. He has type 2 diabetes  mellitus. There are no hypoglycemic associated symptoms. Pertinent negatives for diabetes include no blurred vision, no polydipsia, no polyphagia and no polyuria. There are no hypoglycemic complications. Diabetic complications include heart disease. Risk factors for coronary artery disease include diabetes mellitus, dyslipidemia, male sex, hypertension, obesity and sedentary lifestyle. He participates in exercise intermittently. His breakfast blood glucose is taken between 8-9 am. His breakfast blood glucose range is generally 90-110 mg/dl. An ACE inhibitor/angiotensin II receptor blocker is being taken. He does not see a podiatrist.Eye exam is current.  Hypertension This is a chronic problem. The current episode started more than 1 year ago. The problem has been gradually improving since onset. The problem is controlled. Pertinent negatives include no blurred vision or palpitations. Risk factors for coronary artery disease include diabetes mellitus, dyslipidemia, obesity, sedentary lifestyle and male gender. The current treatment provides moderate improvement. Compliance problems include exercise.  Hypertensive end-organ damage includes CAD/MI.     Past Medical History:  Diagnosis Date   Anxiety    Coronary artery disease    Diabetes mellitus without complication (HCC)    type 2    Hypertension    Hypothyroid    Obstructive sleep apnea    CPAP   Pure hypercholesterolemia    PVD (peripheral vascular disease) (HCC)    Testicular hypofunction    Vitamin D  deficiency      Family History  Problem Relation Age of Onset   Diabetes type II Mother    Thyroid disease Mother  Cancer - Other Father        Liver   Hypertension Father    Heart disease Father        Bypass surgery   Thyroid disease Sister    Diabetes type II Brother    Diabetes type II Brother      Current Outpatient Medications:    Accu-Chek Softclix Lancets lancets, USE ONE LANCET IN MORNING, ONE AT NOON AND ONE AT  BEDTIME, Disp: 100 each, Rfl: 1   aspirin  81 MG EC tablet, Take 4 tablets (325 mg total) by mouth daily. 1 tablet daily, Disp: , Rfl:    atorvastatin  (LIPITOR ) 80 MG tablet, TAKE 1 TABLET(80 MG) BY MOUTH AT BEDTIME, Disp: 90 tablet, Rfl: 2   Blood Glucose Monitoring Suppl DEVI, 1 each by Does not apply route in the morning, at noon, and at bedtime. May substitute to any manufacturer covered by patient's insurance., Disp: 1 each, Rfl: 0   Dapagliflozin  Pro-metFORMIN  ER 09-999 MG TB24, Take 1 tablet by mouth daily., Disp: 90 tablet, Rfl: 1   levothyroxine  (SYNTHROID ) 100 MCG tablet, TAKE 1 TABLET(100 MCG) BY MOUTH DAILY, Disp: 90 tablet, Rfl: 2   lisinopril -hydrochlorothiazide  (ZESTORETIC ) 20-12.5 MG tablet, TAKE 1 TABLET BY MOUTH DAILY, Disp: 90 tablet, Rfl: 1   metoprolol  tartrate (LOPRESSOR ) 25 MG tablet, TAKE 1/2 TABLET(12.5 MG) BY MOUTH TWICE DAILY, Disp: 90 tablet, Rfl: 1   Semaglutide ,0.25 or 0.5MG /DOS, 2 MG/3ML SOPN, Inject 0.5mg  sq weeklly, Disp: 3 mL, Rfl: 3   sertraline  (ZOLOFT ) 50 MG tablet, TAKE 1 TABLET BY MOUTH EVERY DAY, Disp: 90 tablet, Rfl: 1   No Known Allergies   Review of Systems  Constitutional: Negative.   Eyes:  Negative for blurred vision.  Respiratory: Negative.    Cardiovascular: Negative.  Negative for palpitations.  Gastrointestinal: Negative.   Endocrine: Negative for polydipsia, polyphagia and polyuria.  Neurological: Negative.   Psychiatric/Behavioral: Negative.       Today's Vitals   06/14/24 1405  BP: 120/82  Pulse: 72  Temp: 98.2 F (36.8 C)  TempSrc: Oral  Weight: 247 lb (112 kg)  Height: 6' 1 (1.854 m)  PainSc: 0-No pain   Body mass index is 32.59 kg/m.  Wt Readings from Last 3 Encounters:  06/14/24 247 lb (112 kg)  03/15/24 251 lb (113.9 kg)  02/15/24 251 lb (113.9 kg)    The ASCVD Risk score (Arnett DK, et al., 2019) failed to calculate for the following reasons:   The valid total cholesterol range is 130 to 320 mg/dL  Objective:   Physical Exam Vitals and nursing note reviewed.  Constitutional:      Appearance: Normal appearance. He is obese.  HENT:     Head: Normocephalic and atraumatic.     Mouth/Throat:     Pharynx: No oropharyngeal exudate or posterior oropharyngeal erythema.   Eyes:     Extraocular Movements: Extraocular movements intact.    Cardiovascular:     Rate and Rhythm: Normal rate and regular rhythm.     Heart sounds: Normal heart sounds.  Pulmonary:     Effort: Pulmonary effort is normal.     Breath sounds: Normal breath sounds.   Musculoskeletal:     Cervical back: Normal range of motion.   Skin:    General: Skin is warm.   Neurological:     General: No focal deficit present.     Mental Status: He is alert.   Psychiatric:        Mood and Affect: Mood  normal.         Assessment And Plan:  Benign hypertensive heart disease without heart failure Assessment & Plan: Diastolic blood pressure is slightly above target. Missed medication today may have affected reading. Regular medication adherence is crucial. - Encourage adherence to blood pressure medications including metoprolol  and lisinopril . - Monitor blood pressure regularly. - He will continue with metoprolol  tartrate 25mg  1/2 tab twice daily and lisinopril /hydrochlorothiazide  20/12.5mg  daily.   - He is reminded to follow a low sodium diet and to follow a heart healthy lifestyle.   Coronary artery disease involving native coronary artery of native heart without angina pectoris Assessment & Plan: Chronic, he is s/p CABG.  Importance of dietary and medication compliance was discussed with the patient.  He will continue with ASA, atorvastatin  80mg  and metoprolol .    Dyslipidemia associated with type 2 diabetes mellitus (HCC) Assessment & Plan: Chronic, LDL goal is less than 70. He will continue with atorvastatin  80mg  daily.  Regarding DM, diabetic foot exam was performed.  He will continue with Ozempic  0.5mg  weekly and Xigduo   10/1000mg  daily. He will rto in 4 months for re-evaluation. He has not been regularly monitoring his blood glucose levels. Emphasized consistent monitoring and medication adherence to prevent complications. - Check hemoglobin A1c today. - Encourage regular blood glucose monitoring with Dexcom G7. - Reinforce adherence to diabetes medications including Ozempic  and Xigduo . - Encouraged to resume his regular exercise schedule to avoid muscle atrophy  Orders: -     Hemoglobin A1c -     Basic metabolic panel with GFR  Primary hypothyroidism Assessment & Plan: Chronic, he is currently taking generic Synthroid  100mcg daily. I will check a TSHand adjust meds as needed.   Orders: -     TSH + free T4  Class 1 obesity due to excess calories with serious comorbidity and body mass index (BMI) of 32.0 to 32.9 in adult Assessment & Plan: HE has lost 4lbs since his last visit! He is encouraged to strive for BMI less than 30 to decrease cardiac risk. Advised to aim for at least 150 minutes of exercise per week.    Follow-up Had lab work done at the urologist's office but has not seen the urologist this year. Advised obtaining lab results for continuity of care. - Sign release form to obtain lab results from the urologist. - Follow up with the urologist in December.  Return for controlled DM check-4 months.  Patient was given opportunity to ask questions. Patient verbalized understanding of the plan and was able to repeat key elements of the plan. All questions were answered to their satisfaction.    I, [name], MD, have reviewed all documentation for this visit. The documentation on 06/14/24 for the exam, diagnosis, procedures, and orders are all accurate and complete.   IF YOU HAVE BEEN REFERRED TO A SPECIALIST, IT MAY TAKE 1-2 WEEKS TO SCHEDULE/PROCESS THE REFERRAL. IF YOU HAVE NOT HEARD FROM US /SPECIALIST IN TWO WEEKS, PLEASE GIVE US  A CALL AT [phone] X [phone].

## 2024-06-15 ENCOUNTER — Ambulatory Visit: Payer: Self-pay | Admitting: Internal Medicine

## 2024-06-15 LAB — BASIC METABOLIC PANEL WITH GFR
BUN/Creatinine Ratio: 22 (ref 10–24)
BUN: 22 mg/dL (ref 8–27)
CO2: 23 mmol/L (ref 20–29)
Calcium: 9.9 mg/dL (ref 8.6–10.2)
Chloride: 101 mmol/L (ref 96–106)
Creatinine, Ser: 1.01 mg/dL (ref 0.76–1.27)
Glucose: 106 mg/dL — ABNORMAL HIGH (ref 70–99)
Potassium: 4.6 mmol/L (ref 3.5–5.2)
Sodium: 140 mmol/L (ref 134–144)
eGFR: 84 mL/min/{1.73_m2} (ref >59)

## 2024-06-15 LAB — TSH+FREE T4
Free T4: 1.48 ng/dL (ref 0.82–1.77)
TSH: 1.92 u[IU]/mL (ref 0.450–4.500)

## 2024-06-15 LAB — HEMOGLOBIN A1C
Est. average glucose Bld gHb Est-mCnc: 117 mg/dL
Hgb A1c MFr Bld: 5.7 % — ABNORMAL HIGH (ref 4.8–5.6)

## 2024-07-15 ENCOUNTER — Other Ambulatory Visit: Payer: Self-pay | Admitting: Internal Medicine

## 2024-08-10 ENCOUNTER — Other Ambulatory Visit: Payer: Self-pay | Admitting: Internal Medicine

## 2024-08-10 DIAGNOSIS — E1169 Type 2 diabetes mellitus with other specified complication: Secondary | ICD-10-CM

## 2024-09-09 ENCOUNTER — Other Ambulatory Visit: Payer: Self-pay | Admitting: Internal Medicine

## 2024-09-09 DIAGNOSIS — E785 Hyperlipidemia, unspecified: Secondary | ICD-10-CM

## 2024-09-09 DIAGNOSIS — I2510 Atherosclerotic heart disease of native coronary artery without angina pectoris: Secondary | ICD-10-CM

## 2024-10-11 ENCOUNTER — Other Ambulatory Visit: Payer: Self-pay | Admitting: Internal Medicine

## 2024-10-18 ENCOUNTER — Other Ambulatory Visit: Payer: Self-pay | Admitting: Internal Medicine

## 2024-10-18 ENCOUNTER — Ambulatory Visit: Admitting: Internal Medicine

## 2024-10-18 DIAGNOSIS — I119 Hypertensive heart disease without heart failure: Secondary | ICD-10-CM

## 2024-10-20 ENCOUNTER — Ambulatory Visit (INDEPENDENT_AMBULATORY_CARE_PROVIDER_SITE_OTHER): Payer: Self-pay | Admitting: Internal Medicine

## 2024-10-20 ENCOUNTER — Encounter: Payer: Self-pay | Admitting: Internal Medicine

## 2024-10-20 VITALS — BP 110/70 | HR 81 | Temp 98.6°F | Ht 73.0 in | Wt 249.0 lb

## 2024-10-20 DIAGNOSIS — Z23 Encounter for immunization: Secondary | ICD-10-CM

## 2024-10-20 DIAGNOSIS — E1169 Type 2 diabetes mellitus with other specified complication: Secondary | ICD-10-CM

## 2024-10-20 DIAGNOSIS — I2510 Atherosclerotic heart disease of native coronary artery without angina pectoris: Secondary | ICD-10-CM

## 2024-10-20 DIAGNOSIS — E785 Hyperlipidemia, unspecified: Secondary | ICD-10-CM

## 2024-10-20 DIAGNOSIS — I119 Hypertensive heart disease without heart failure: Secondary | ICD-10-CM | POA: Diagnosis not present

## 2024-10-20 NOTE — Assessment & Plan Note (Signed)
 Chronic, he is s/p CABG.  Importance of dietary and medication compliance was discussed with the patient.  He will continue with ASA, atorvastatin 80mg  and metoprolol.

## 2024-10-20 NOTE — Patient Instructions (Signed)
 Hypertension, Adult Hypertension is another name for high blood pressure. High blood pressure forces your heart to work harder to pump blood. This can cause problems over time. There are two numbers in a blood pressure reading. There is a top number (systolic) over a bottom number (diastolic). It is best to have a blood pressure that is below 120/80. What are the causes? The cause of this condition is not known. Some other conditions can lead to high blood pressure. What increases the risk? Some lifestyle factors can make you more likely to develop high blood pressure: Smoking. Not getting enough exercise or physical activity. Being overweight. Having too much fat, sugar, calories, or salt (sodium) in your diet. Drinking too much alcohol. Other risk factors include: Having any of these conditions: Heart disease. Diabetes. High cholesterol. Kidney disease. Obstructive sleep apnea. Having a family history of high blood pressure and high cholesterol. Age. The risk increases with age. Stress. What are the signs or symptoms? High blood pressure may not cause symptoms. Very high blood pressure (hypertensive crisis) may cause: Headache. Fast or uneven heartbeats (palpitations). Shortness of breath. Nosebleed. Vomiting or feeling like you may vomit (nauseous). Changes in how you see. Very bad chest pain. Feeling dizzy. Seizures. How is this treated? This condition is treated by making healthy lifestyle changes, such as: Eating healthy foods. Exercising more. Drinking less alcohol. Your doctor may prescribe medicine if lifestyle changes do not help enough and if: Your top number is above 130. Your bottom number is above 80. Your personal target blood pressure may vary. Follow these instructions at home: Eating and drinking  If told, follow the DASH eating plan. To follow this plan: Fill one half of your plate at each meal with fruits and vegetables. Fill one fourth of your plate  at each meal with whole grains. Whole grains include whole-wheat pasta, brown rice, and whole-grain bread. Eat or drink low-fat dairy products, such as skim milk or low-fat yogurt. Fill one fourth of your plate at each meal with low-fat (lean) proteins. Low-fat proteins include fish, chicken without skin, eggs, beans, and tofu. Avoid fatty meat, cured and processed meat, or chicken with skin. Avoid pre-made or processed food. Limit the amount of salt in your diet to less than 1,500 mg each day. Do not drink alcohol if: Your doctor tells you not to drink. You are pregnant, may be pregnant, or are planning to become pregnant. If you drink alcohol: Limit how much you have to: 0-1 drink a day for women. 0-2 drinks a day for men. Know how much alcohol is in your drink. In the [place] one drink equals one 12 oz bottle of beer (355 mL), one 5 oz glass of wine (148 mL), or one 1 oz glass of hard liquor (44 mL). Lifestyle  Work with your doctor to stay at a healthy weight or to lose weight. Ask your doctor what the best weight is for you. Get at least 30 minutes of exercise that causes your heart to beat faster (aerobic exercise) most days of the week. This may include walking, swimming, or biking. Get at least 30 minutes of exercise that strengthens your muscles (resistance exercise) at least 3 days a week. This may include lifting weights or doing Pilates. Do not smoke or use any products that contain nicotine or tobacco. If you need help quitting, ask your doctor. Check your blood pressure at home as told by your doctor. Keep all follow-up visits. Medicines Take over-the-counter and prescription medicines  only as told by your doctor. Follow directions carefully. Do not skip doses of blood pressure medicine. The medicine does not work as well if you skip doses. Skipping doses also puts you at risk for problems. Ask your doctor about side effects or reactions to medicines that you should watch  for. Contact a doctor if: You think you are having a reaction to the medicine you are taking. You have headaches that keep coming back. You feel dizzy. You have swelling in your ankles. You have trouble with your vision. Get help right away if: You get a very bad headache. You start to feel mixed up (confused). You feel weak or numb. You feel faint. You have very bad pain in your: Chest. Belly (abdomen). You vomit more than once. You have trouble breathing. These symptoms may be an emergency. Get help right away. Call 911. Do not wait to see if the symptoms will go away. Do not drive yourself to the hospital. Summary Hypertension is another name for high blood pressure. High blood pressure forces your heart to work harder to pump blood. For most people, a normal blood pressure is less than 120/80. Making healthy choices can help lower blood pressure. If your blood pressure does not get lower with healthy choices, you may need to take medicine. This information is not intended to replace advice given to you by your health care provider. Make sure you discuss any questions you have with your health care provider. Document Revised: 09/26/2021 Document Reviewed: 09/26/2021 Elsevier Patient Education  2024 [employer].

## 2024-10-20 NOTE — Assessment & Plan Note (Signed)
 Chronic, well controlled.  . - Encourage adherence to blood pressure medications including metoprolol  and lisinopril . - Monitor blood pressure regularly. - He will continue with metoprolol  tartrate 25mg  1/2 tab twice daily and lisinopril /hydrochlorothiazide  20/12.5mg  daily.   - He is reminded to follow a low sodium diet and to follow a heart healthy lifestyle.

## 2024-10-20 NOTE — Progress Notes (Signed)
 I,[name] Hammond, CMA,acting as a neurosurgeon for [name] LOISE Slocumb, MD.,have documented all relevant documentation on the behalf of [name] LOISE Slocumb, MD,as directed by  [name] LOISE Slocumb, MD while in the presence of [name] LOISE Slocumb, MD.  Subjective:  Patient ID: [name] Hammond , male    DOB: [birth year] , 63 y.o.   MRN: [id number]  Chief Complaint  Patient presents with   Hypertension    Patient presents today for a diabetes and blood pressure check. Patient reports compliance with his meds. Patient denies having chest pain, headaches or sob. Patient does not have any questions or concerns at this time.    Diabetes    HPI Discussed the use of AI scribe software for clinical note transcription with the patient, who gave verbal consent to proceed.  History of Present Illness [name] Hammond is a 63 year old male with diabetes and hypertension who presents for a routine diabetes and blood pressure check.  His blood sugar levels have been stable, and he can sense when they are well-controlled. He recently returned from a vacation with family, which may have affected his diet, but he only gained two pounds.  His current medications include aspirin , atorvastatin  80 mg, levothyroxine  100 mcg, lisinopril  hydrochlorothiazide  20/12.5 mg, metoprolol  25 mg, Ozempic  0.5 mg, sertraline  50 mg, and Xigduo  09/999 mg. He occasionally faces difficulty obtaining Ozempic  due to pharmacy supply issues, requiring travel to [place] to secure it.  He received a flu shot today and has completed his shingles and pneumonia vaccinations earlier this year. His last COVID vaccine was in February.  No headaches and he feels well overall.   Diabetes He presents for his follow-up diabetic visit. He has type 2 diabetes mellitus. There are no hypoglycemic associated symptoms. Pertinent negatives for diabetes include no blurred vision, no polydipsia, no polyphagia and no polyuria. There are no hypoglycemic complications.  Diabetic complications include heart disease. Risk factors for coronary artery disease include diabetes mellitus, dyslipidemia, male sex, hypertension, obesity and sedentary lifestyle. He participates in exercise intermittently. His breakfast blood glucose is taken between 8-9 am. His breakfast blood glucose range is generally 90-110 mg/dl. An ACE inhibitor/angiotensin II receptor blocker is being taken. He does not see a podiatrist.Eye exam is current.  Hypertension This is a chronic problem. The current episode started more than 1 year ago. The problem has been gradually improving since onset. The problem is controlled. Pertinent negatives include no blurred vision or palpitations. Risk factors for coronary artery disease include diabetes mellitus, dyslipidemia, obesity, sedentary lifestyle and male gender. The current treatment provides moderate improvement. Compliance problems include exercise.  Hypertensive end-organ damage includes CAD/MI.     Past Medical History:  Diagnosis Date   Anxiety    Coronary artery disease    Diabetes mellitus without complication (HCC)    type 2    Hypertension    Hypothyroid    Obstructive sleep apnea    CPAP   Pure hypercholesterolemia    PVD (peripheral vascular disease)    Testicular hypofunction    Vitamin D  deficiency      Family History  Problem Relation Age of Onset   Diabetes type II Mother    Thyroid disease Mother    Cancer - Other Father        Liver   Hypertension Father    Heart disease Father        Bypass surgery   Thyroid disease Sister    Diabetes type II Brother  Diabetes type II Brother      Current Outpatient Medications:    Accu-Chek Softclix Lancets lancets, USE ONE LANCET IN MORNING, ONE AT NOON AND ONE AT BEDTIME, Disp: 100 each, Rfl: 1   aspirin  81 MG EC tablet, Take 4 tablets (325 mg total) by mouth daily. 1 tablet daily, Disp: , Rfl:    atorvastatin  (LIPITOR ) 80 MG tablet, TAKE 1 TABLET(80 MG) BY MOUTH AT BEDTIME,  Disp: 90 tablet, Rfl: 2   Blood Glucose Monitoring Suppl DEVI, 1 each by Does not apply route in the morning, at noon, and at bedtime. May substitute to any manufacturer covered by patient's insurance., Disp: 1 each, Rfl: 0   levothyroxine  (SYNTHROID ) 100 MCG tablet, TAKE 1 TABLET(100 MCG) BY MOUTH DAILY, Disp: 90 tablet, Rfl: 2   lisinopril -hydrochlorothiazide  (ZESTORETIC ) 20-12.5 MG tablet, TAKE 1 TABLET BY MOUTH DAILY, Disp: 90 tablet, Rfl: 1   metoprolol  tartrate (LOPRESSOR ) 25 MG tablet, TAKE 1/2 TABLET(12.5 MG) BY MOUTH TWICE DAILY, Disp: 90 tablet, Rfl: 1   OZEMPIC , 0.25 OR 0.5 MG/DOSE, 2 MG/3ML SOPN, INJECT 0.5MG  INTO THE SKIN ONCE A WEEK, Disp: 3 mL, Rfl: 3   sertraline  (ZOLOFT ) 50 MG tablet, TAKE 1 TABLET(50 MG) BY MOUTH DAILY, Disp: 90 tablet, Rfl: 1   XIGDUO  XR 09-999 MG TB24, TAKE 1 TABLET BY MOUTH DAILY, Disp: 90 tablet, Rfl: 1   No Known Allergies   Review of Systems  Constitutional: Negative.   HENT: Negative.    Eyes:  Negative for blurred vision.  Respiratory: Negative.    Cardiovascular: Negative.  Negative for palpitations.  Gastrointestinal: Negative.   Endocrine: Negative.  Negative for polydipsia, polyphagia and polyuria.  Skin: Negative.   Allergic/Immunologic: Negative.   Hematological: Negative.      Today's Vitals   10/20/24 1418  BP: 110/70  Pulse: 81  Temp: 98.6 F (37 C)  SpO2: 98%  Weight: 249 lb (112.9 kg)  Height: 6' 1 (1.854 m)   Body mass index is 32.85 kg/m.  Wt Readings from Last 3 Encounters:  10/20/24 249 lb (112.9 kg)  06/14/24 247 lb (112 kg)  03/15/24 251 lb (113.9 kg)     Objective:  Physical Exam Vitals and nursing note reviewed.  Constitutional:      Appearance: Normal appearance. He is obese.  HENT:     Head: Normocephalic and atraumatic.  Eyes:     Extraocular Movements: Extraocular movements intact.  Cardiovascular:     Rate and Rhythm: Normal rate and regular rhythm.     Heart sounds: Normal heart sounds.   Pulmonary:     Effort: Pulmonary effort is normal.     Breath sounds: Normal breath sounds.  Musculoskeletal:     Cervical back: Normal range of motion.  Skin:    General: Skin is warm.  Neurological:     General: No focal deficit present.     Mental Status: He is alert.  Psychiatric:        Mood and Affect: Mood normal.         Assessment And Plan:  Benign hypertensive heart disease without heart failure Assessment & Plan: Chronic, well controlled.  . - Encourage adherence to blood pressure medications including metoprolol  and lisinopril . - Monitor blood pressure regularly. - He will continue with metoprolol  tartrate 25mg  1/2 tab twice daily and lisinopril /hydrochlorothiazide  20/12.5mg  daily.   - He is reminded to follow a low sodium diet and to follow a heart healthy lifestyle.  Orders: -     CMP14+EGFR  Coronary artery disease involving native coronary artery of native heart without angina pectoris Assessment & Plan: Chronic, he is s/p CABG.  Importance of dietary and medication compliance was discussed with the patient.  He will continue with ASA, atorvastatin  80mg  and metoprolol .    Dyslipidemia associated with type 2 diabetes mellitus (HCC) Assessment & Plan: Recent vacation may have affected dietary habits. Slight weight gain noted.  He will continue with Ozempic  0.5mg  weekly and Xigduo  10/1000mg  daily.  LDL goal is less than 55, given history of CAD   - Order A1c test. - Check liver and kidney function tests. - Ensure Ozempic  prescription is filled.   Orders: -     CMP14+EGFR -     Hemoglobin A1c  Immunization due -     Flu vaccine trivalent PF, 6mos and older(Flulaval,Afluria,Fluarix,Fluzone)  General Health Maintenance Received flu shot today. COVID vaccine in February. Completed shingles, tetanus, and pneumonia vaccines. Return for 4 month dm f/u.SABRA  Patient was given opportunity to ask questions. Patient verbalized understanding of the plan and was able  to repeat key elements of the plan. All questions were answered to their satisfaction.   I, [name] LOISE Slocumb, MD, have reviewed all documentation for this visit. The documentation on 10/20/24 for the exam, diagnosis, procedures, and orders are all accurate and complete.   IF YOU HAVE BEEN REFERRED TO A SPECIALIST, IT MAY TAKE 1-2 WEEKS TO SCHEDULE/PROCESS THE REFERRAL. IF YOU HAVE NOT HEARD FROM US /SPECIALIST IN TWO WEEKS, PLEASE GIVE US  A CALL AT [phone].   THE PATIENT IS ENCOURAGED TO PRACTICE SOCIAL DISTANCING DUE TO THE COVID-19 PANDEMIC.

## 2024-10-21 LAB — CMP14+EGFR
ALT: 9 IU/L (ref 0–44)
AST: 13 IU/L (ref 0–40)
Albumin: 4 g/dL (ref 3.9–4.9)
Alkaline Phosphatase: 80 IU/L (ref 47–123)
BUN/Creatinine Ratio: 18 (ref 10–24)
BUN: 16 mg/dL (ref 8–27)
Bilirubin Total: 0.6 mg/dL (ref 0.0–1.2)
CO2: 22 mmol/L (ref 20–29)
Calcium: 8.9 mg/dL (ref 8.6–10.2)
Chloride: 101 mmol/L (ref 96–106)
Creatinine, Ser: 0.91 mg/dL (ref 0.76–1.27)
Globulin, Total: 2.7 g/dL (ref 1.5–4.5)
Glucose: 74 mg/dL (ref 70–99)
Potassium: 4.4 mmol/L (ref 3.5–5.2)
Sodium: 137 mmol/L (ref 134–144)
Total Protein: 6.7 g/dL (ref 6.0–8.5)
eGFR: 95 mL/min/1.73 (ref >59)

## 2024-10-21 LAB — HEMOGLOBIN A1C
Est. average glucose Bld gHb Est-mCnc: 117 mg/dL
Hgb A1c MFr Bld: 5.7 % — ABNORMAL HIGH (ref 4.8–5.6)

## 2024-10-23 NOTE — Assessment & Plan Note (Signed)
 Recent vacation may have affected dietary habits. Slight weight gain noted.  He will continue with Ozempic  0.5mg  weekly and Xigduo  10/1000mg  daily.  LDL goal is less than 55, given history of CAD   - Order A1c test. - Check liver and kidney function tests. - Ensure Ozempic  prescription is filled.

## 2024-10-24 ENCOUNTER — Ambulatory Visit: Payer: Self-pay | Admitting: Internal Medicine

## 2024-11-14 ENCOUNTER — Ambulatory Visit: Payer: Self-pay | Admitting: Internal Medicine

## 2024-12-09 ENCOUNTER — Other Ambulatory Visit: Payer: Self-pay | Admitting: Internal Medicine

## 2024-12-09 DIAGNOSIS — I119 Hypertensive heart disease without heart failure: Secondary | ICD-10-CM

## 2024-12-14 ENCOUNTER — Other Ambulatory Visit: Payer: Self-pay | Admitting: Internal Medicine

## 2024-12-14 DIAGNOSIS — E1169 Type 2 diabetes mellitus with other specified complication: Secondary | ICD-10-CM

## 2025-01-02 LAB — OPHTHALMOLOGY REPORT-SCANNED

## 2025-01-16 ENCOUNTER — Other Ambulatory Visit: Payer: Self-pay | Admitting: Internal Medicine

## 2025-02-22 ENCOUNTER — Encounter: Payer: 59 | Admitting: Internal Medicine
